# Patient Record
Sex: Male | Born: 1995
Health system: Southern US, Community
[De-identification: ages and names within clinical notes are randomized; demographics above are authoritative.]

## PROBLEM LIST (undated history)

## (undated) DIAGNOSIS — E782 Mixed hyperlipidemia: Secondary | ICD-10-CM

## (undated) DIAGNOSIS — D649 Anemia, unspecified: Secondary | ICD-10-CM

## (undated) DIAGNOSIS — K219 Gastro-esophageal reflux disease without esophagitis: Secondary | ICD-10-CM

## (undated) DIAGNOSIS — G43909 Migraine, unspecified, not intractable, without status migrainosus: Secondary | ICD-10-CM

## (undated) DIAGNOSIS — K5090 Crohn's disease, unspecified, without complications: Secondary | ICD-10-CM

## (undated) DIAGNOSIS — E66811 Obesity, class 1: Secondary | ICD-10-CM

## (undated) DIAGNOSIS — Z86010 Personal history of colonic polyps: Secondary | ICD-10-CM

## (undated) DIAGNOSIS — K602 Anal fissure, unspecified: Secondary | ICD-10-CM

## (undated) DIAGNOSIS — J302 Other seasonal allergic rhinitis: Secondary | ICD-10-CM

## (undated) DIAGNOSIS — R202 Paresthesia of skin: Secondary | ICD-10-CM

## (undated) DIAGNOSIS — I67850 Cerebral autosomal dominant arteriopathy with subcortical infarcts and leukoencephalopathy: Secondary | ICD-10-CM

## (undated) DIAGNOSIS — F419 Anxiety disorder, unspecified: Secondary | ICD-10-CM

## (undated) DIAGNOSIS — E669 Obesity, unspecified: Secondary | ICD-10-CM

## (undated) DIAGNOSIS — I471 Supraventricular tachycardia, unspecified: Secondary | ICD-10-CM

## (undated) HISTORY — DX: Other seasonal allergic rhinitis: J30.2

## (undated) HISTORY — DX: Anemia, unspecified: D64.9

## (undated) HISTORY — DX: Mixed hyperlipidemia: E78.2

## (undated) HISTORY — DX: Crohn's disease, unspecified, without complications: K50.90

## (undated) HISTORY — DX: Migraine, unspecified, not intractable, without status migrainosus: G43.909

## (undated) HISTORY — DX: Gastro-esophageal reflux disease without esophagitis: K21.9

## (undated) HISTORY — DX: Supraventricular tachycardia, unspecified: I47.10

## (undated) HISTORY — DX: Paresthesia of skin: R20.2

## (undated) HISTORY — DX: Obesity, unspecified: E66.9

## (undated) HISTORY — DX: Anal fissure, unspecified: K60.2

## (undated) HISTORY — PX: COLONOSCOPY: SHX174

## (undated) HISTORY — DX: Cerebral autosomal dominant arteriopathy with subcortical infarcts and leukoencephalopathy: I67.850

## (undated) HISTORY — DX: Obesity, class 1: E66.811

## (undated) HISTORY — PX: TRANSTHORACIC ECHOCARDIOGRAM: SHX275

## (undated) HISTORY — DX: Anxiety disorder, unspecified: F41.9

---
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
## 2015-01-06 DIAGNOSIS — J019 Acute sinusitis, unspecified: Secondary | ICD-10-CM | POA: Insufficient documentation

## 2016-01-03 DIAGNOSIS — Z79899 Other long term (current) drug therapy: Secondary | ICD-10-CM | POA: Diagnosis not present

## 2016-01-03 DIAGNOSIS — K602 Anal fissure, unspecified: Secondary | ICD-10-CM | POA: Insufficient documentation

## 2016-01-03 DIAGNOSIS — K219 Gastro-esophageal reflux disease without esophagitis: Secondary | ICD-10-CM | POA: Diagnosis not present

## 2016-01-03 DIAGNOSIS — K210 Gastro-esophageal reflux disease with esophagitis: Secondary | ICD-10-CM | POA: Diagnosis not present

## 2016-01-03 DIAGNOSIS — D509 Iron deficiency anemia, unspecified: Secondary | ICD-10-CM | POA: Insufficient documentation

## 2016-01-03 DIAGNOSIS — K5010 Crohn's disease of large intestine without complications: Secondary | ICD-10-CM | POA: Diagnosis not present

## 2016-08-23 ENCOUNTER — Emergency Department
Admission: EM | Admit: 2016-08-23 | Discharge: 2016-08-23 | Disposition: A | Discharge status: Home / Self Care | Payer: 59 | Source: Home / Self Care | Attending: Family Medicine | Admitting: Family Medicine

## 2016-08-23 ENCOUNTER — Emergency Department (INDEPENDENT_AMBULATORY_CARE_PROVIDER_SITE_OTHER): Payer: 59

## 2016-08-23 ENCOUNTER — Ambulatory Visit: Payer: Self-pay | Admitting: Physician Assistant

## 2016-08-23 DIAGNOSIS — B9789 Other viral agents as the cause of diseases classified elsewhere: Secondary | ICD-10-CM

## 2016-08-23 DIAGNOSIS — R509 Fever, unspecified: Secondary | ICD-10-CM | POA: Diagnosis not present

## 2016-08-23 DIAGNOSIS — R0981 Nasal congestion: Secondary | ICD-10-CM

## 2016-08-23 DIAGNOSIS — J069 Acute upper respiratory infection, unspecified: Secondary | ICD-10-CM

## 2016-08-23 IMAGING — DX DG SINUSES COMPLETE 3+V
3 series · 3 of 3 positions shown · non-contrast
Comparison: No prior.

CLINICAL DATA: Fever.  Pressure .

EXAM:
PARANASAL SINUSES - COMPLETE 3 + VIEW

[pns waters]
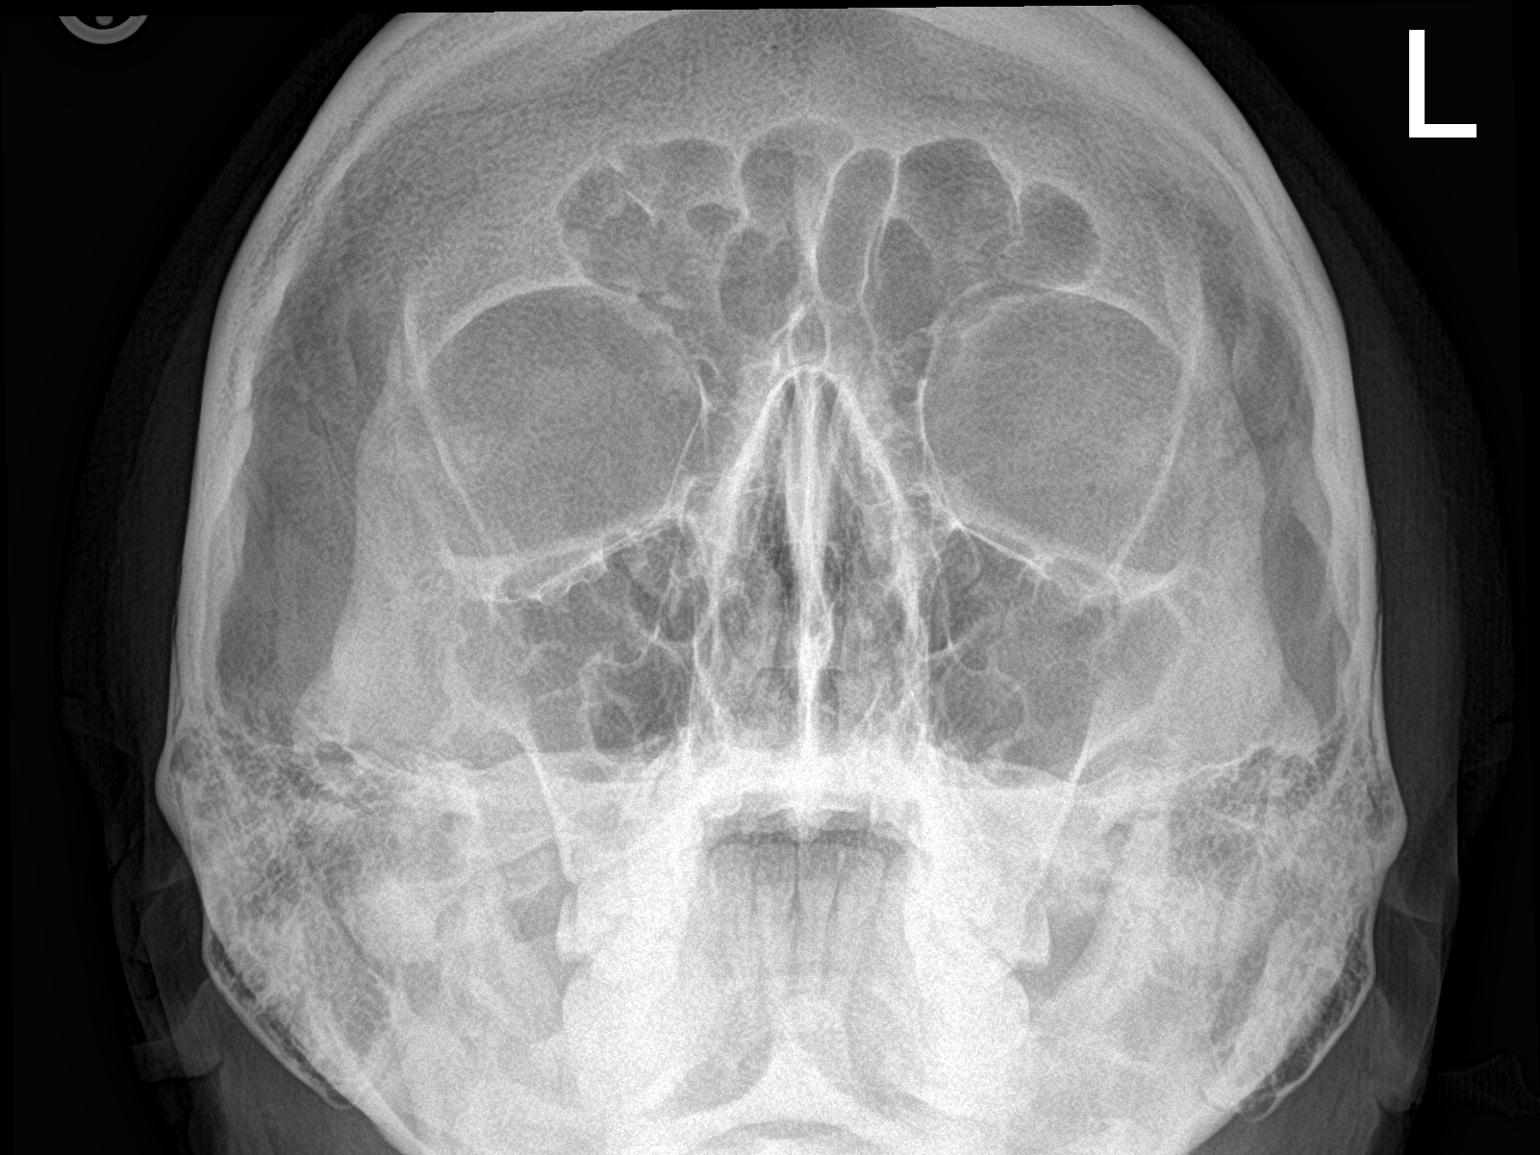

[[person_name]]
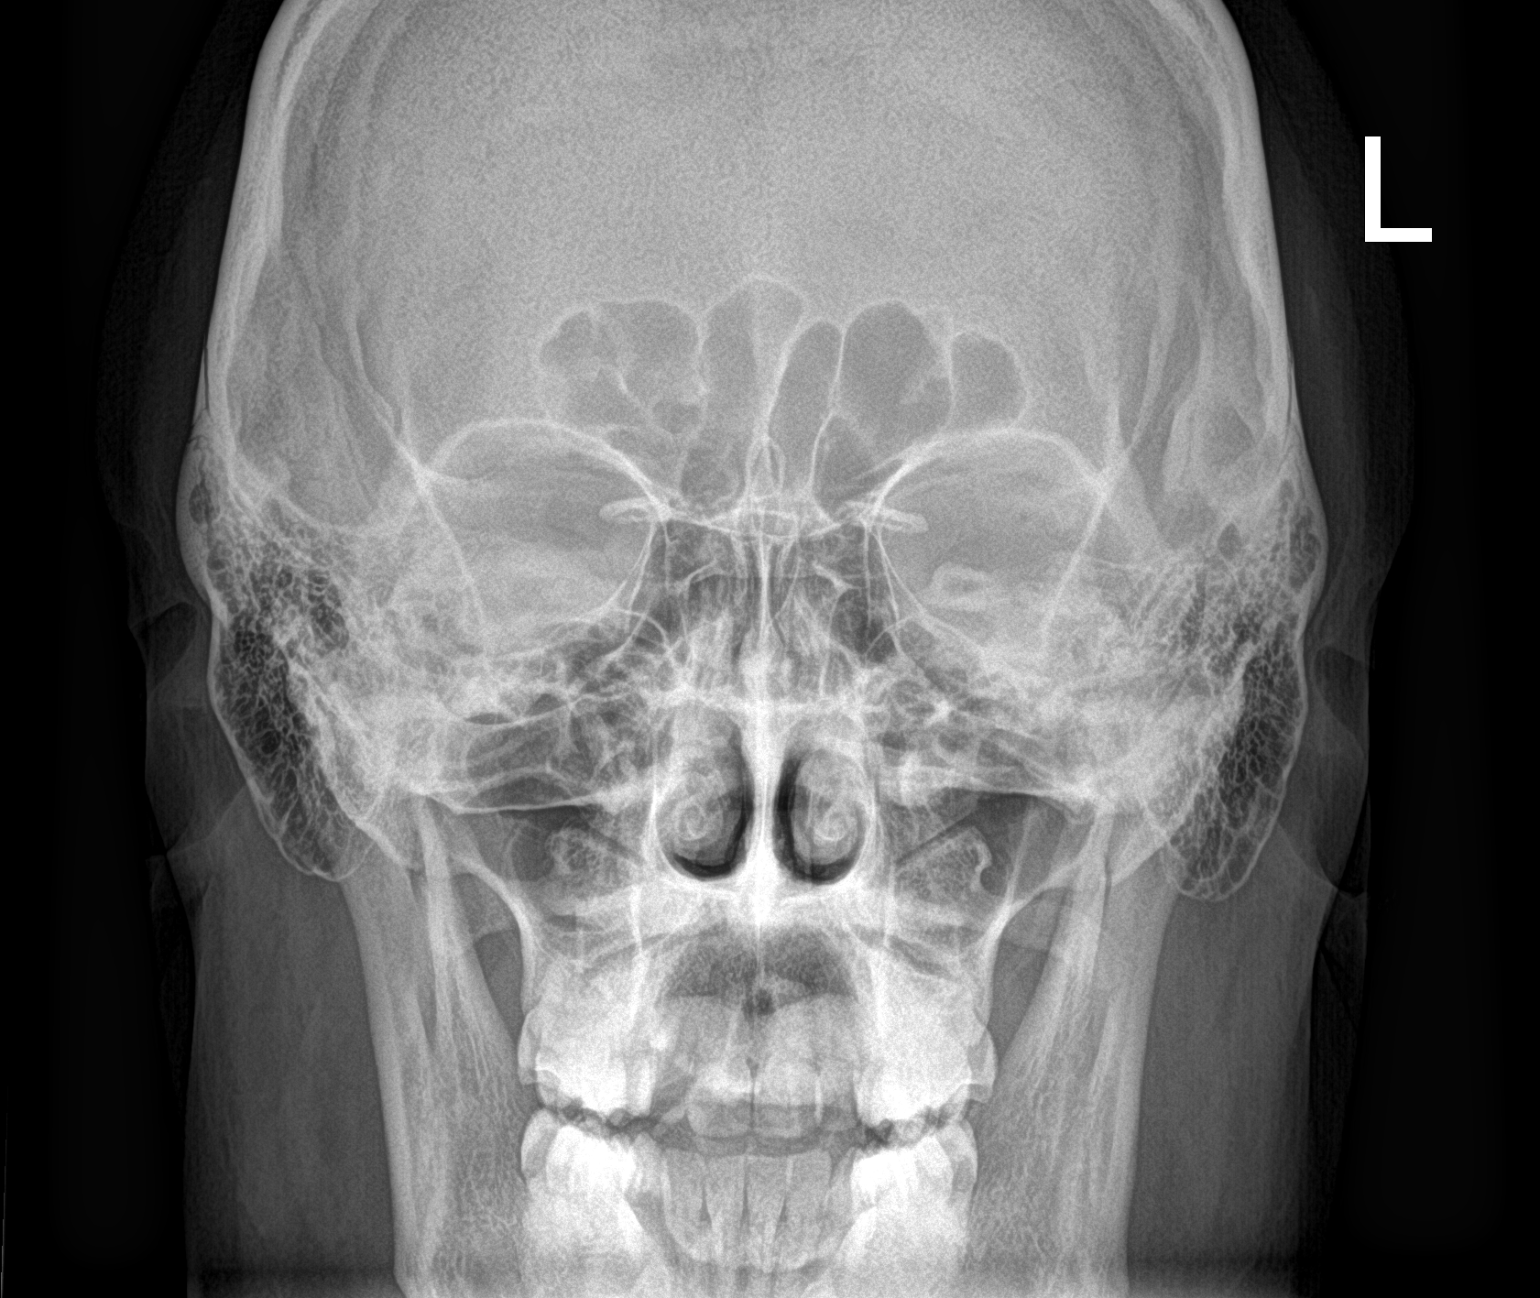

[pns lat]
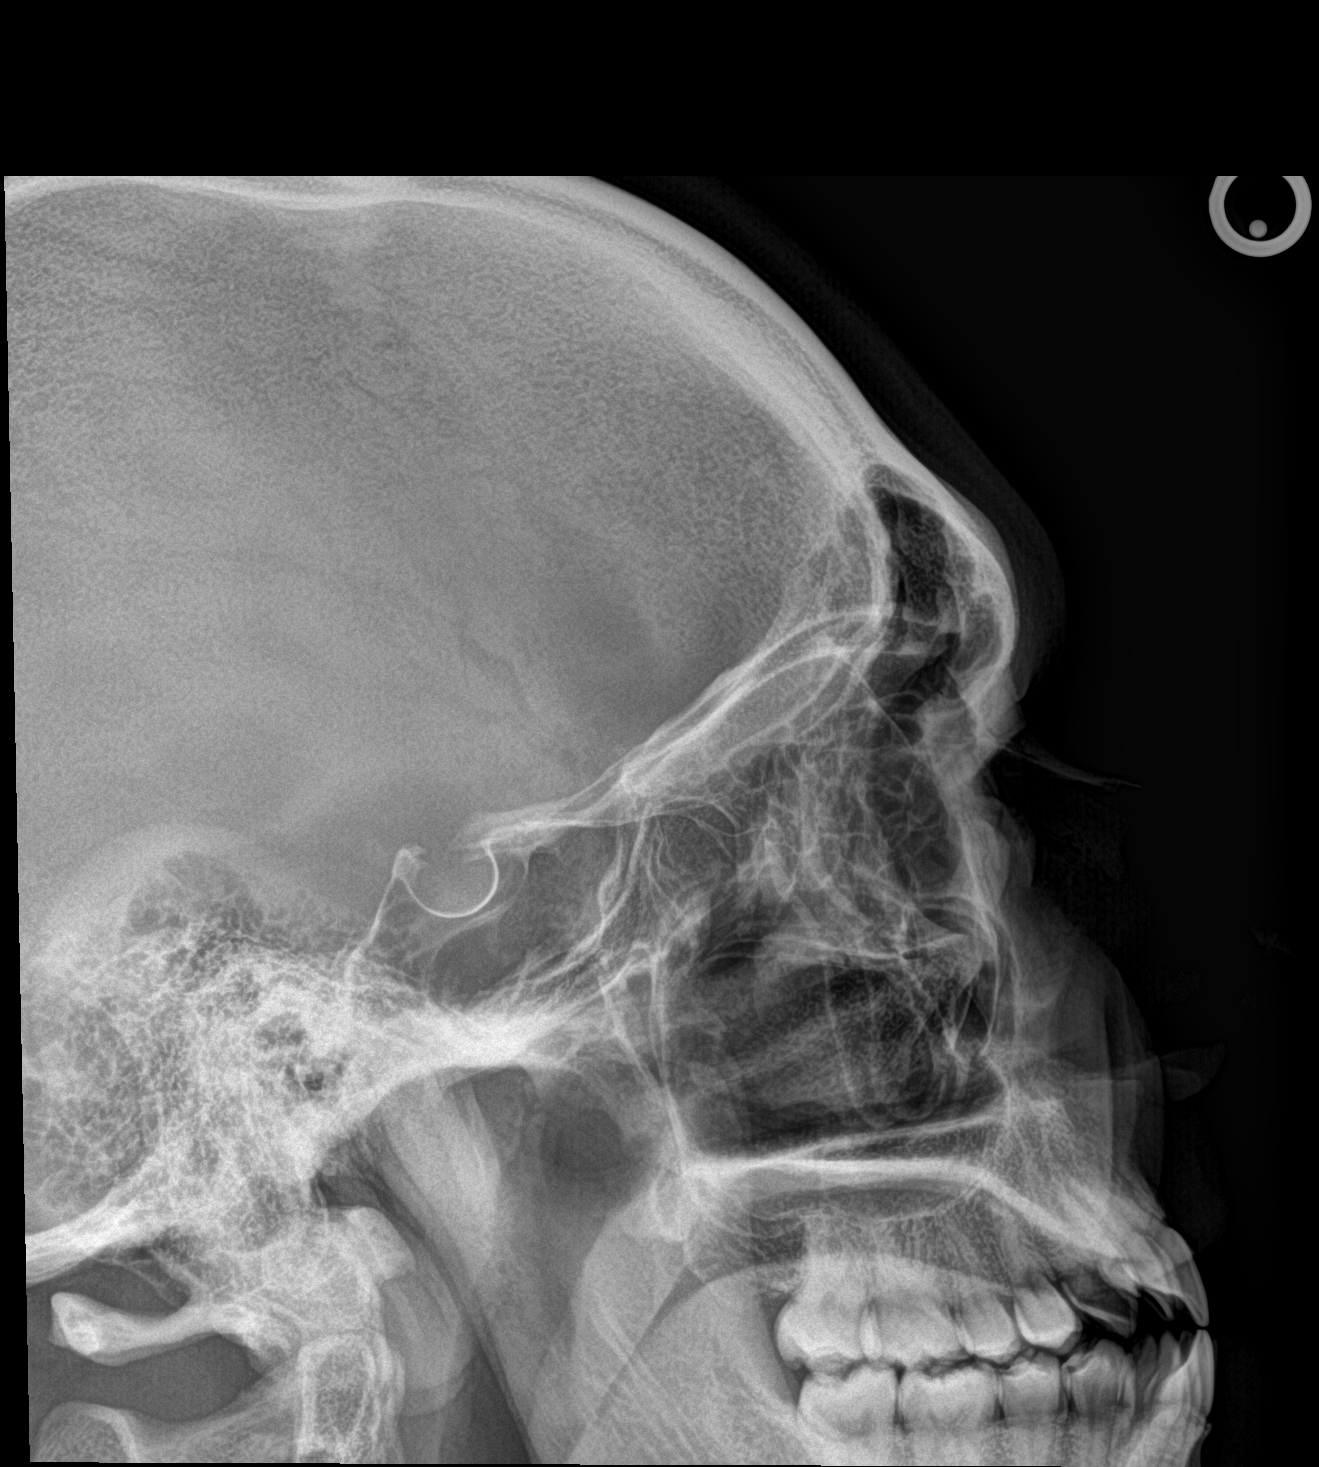

[3 of 3 positions shown; findings below may reference images not displayed]

FINDINGS: Paranasal sinuses are clear. No acute bony abnormality identified.
Orbits intact. Mastoids are clear.
IMPRESSION: No acute or focal abnormality.  No evidence of sinusitis.

## 2016-08-23 MED ORDER — AZITHROMYCIN 250 MG PO TABS: ORAL_TABLET | ORAL | 0 refills | Status: DC

## 2016-08-23 MED ORDER — BENZONATATE 200 MG PO CAPS: ORAL_CAPSULE | ORAL | 0 refills | Status: DC

## 2016-08-23 MED FILL — BENZONATATE 200 MG CAPSULE: 200 | 3 days supply | Qty: 15 | Fill #0

## 2016-08-23 MED FILL — AZITHROMYCIN 250 MG TABLET: 250 | 5 days supply | Qty: 6 | Fill #0

## 2016-08-23 NOTE — Discharge Instructions (Signed)
Take plain guaifenesin (1200mg  extended release tabs such as Mucinex) twice daily, with plenty of water, for cough and congestion.  Continue Pseudoephedrine (30mg , one or two every 4 to 6 hours) for sinus congestion.  Get adequate rest.   May use Afrin nasal spray (or generic oxymetazoline) twice daily for about 5 days and then discontinue.  Also recommend using saline nasal spray several times daily and saline nasal irrigation (AYR is a common brand).  Use Flonase nasal spray each morning after using Afrin nasal spray and saline nasal irrigation. Try warm salt water gargles for sore throat.  Stop all antihistamines for now, and other non-prescription cough/cold preparations.

## 2016-08-23 NOTE — ED Triage Notes (Signed)
Has been going on for about 2 weeks, and has progressively become worse.  Coughing up green mucous.  For the past 5-6 days, low grade fever, the highest was 101 yesterday.  Sinus pressure, sore throat, and fullness in the ears.

## 2016-08-23 NOTE — ED Provider Notes (Signed)
[facility] CARE    CSN: [id number] Arrival date & time: 08/23/16  1331     History   Chief Complaint Chief Complaint  Patient presents with  . Sore Throat  . Fever  . Nasal Congestion    HPI Michael Moon is a 21 y.o. male.   Patient has had increased sinus congestion for about two weeks. During the past 5 days he has developed typical cold-like symptoms developing over several days, including mild sore throat, worsening sinus congestion, headache, fatigue, and cough.  He has also had fever as high as 101 yesterday.  He has a history of recurring sinusitis.   The history is provided by the patient.    History reviewed. No pertinent past medical history.  There are no active problems to display for this patient.   Past Surgical History:  Procedure Laterality Date  . TONSILLECTOMY         Home Medications    Prior to Admission medications   Medication Sig Start Date End Date Taking? Authorizing Provider  mercaptopurine (PURINETHOL) 50 MG tablet Take 100 mg by mouth daily. Give on an empty stomach 1 hour before or 2 hours after meals. Caution: Chemotherapy.   Yes Historical Provider, MD  mesalamine (LIALDA) 1.2 g EC tablet Take 4.8 g by mouth daily with breakfast.   Yes Historical Provider, MD  omeprazole (PRILOSEC) 40 MG capsule Take 40 mg by mouth daily.   Yes Historical Provider, MD  azithromycin (ZITHROMAX Z-PAK) 250 MG tablet Take 2 tabs today; then begin one tab once daily for 4 more days. 08/23/16   Michael Nicolas, MD  benzonatate (TESSALON) 200 MG capsule Take one cap by mouth at bedtime as needed for cough.  May repeat in 4 to 6 hours 08/23/16   Michael Nicolas, MD    Family History History reviewed. No pertinent family history.  Social History Social History  Substance Use Topics  . Smoking status: Never Smoker  . Smokeless tobacco: Never Used  . Alcohol use No     Allergies   Codeine   Review of Systems Review of Systems + sore  throat + cough No pleuritic pain No wheezing + nasal congestion + post-nasal drainage + sinus pain/pressure No itchy/red eyes ? earache No hemoptysis No SOB + fever, + chills No nausea No vomiting No abdominal pain No diarrhea No urinary symptoms No skin rash + fatigue No myalgias + headache Used OTC meds without relief   Physical Exam Triage Vital Signs ED Triage Vitals  Enc Vitals Group     BP 08/23/16 1356 153/91     Pulse Rate 08/23/16 1356 97     Resp --      Temp 08/23/16 1356 98.4 F (36.9 C)     Temp Source 08/23/16 1356 Oral     SpO2 08/23/16 1356 97 %     Weight 08/23/16 1356 278 lb (126.1 kg)     Height 08/23/16 1356 6' (1.829 m)     Head Circumference --      Peak Flow --      Pain Score 08/23/16 1402 3     Pain Loc --      Pain Edu? --      Excl. in [place]? --    No data found.   Updated Vital Signs BP 153/91 (BP Location: Left Arm)   Pulse 97   Temp 98.4 F (36.9 C) (Oral)   Ht 6' (1.829 m)   Wt 278  lb (126.1 kg)   SpO2 97%   BMI 37.70 kg/m   Visual Acuity Right Eye Distance:   Left Eye Distance:   Bilateral Distance:    Right Eye Near:   Left Eye Near:    Bilateral Near:     Physical Exam Nursing notes and Vital Signs reviewed. Appearance:  Patient appears stated age, and in no acute distress Eyes:  Pupils are equal, round, and reactive to light and accomodation.  Extraocular movement is intact.  Conjunctivae are not inflamed  Ears:  Canals normal.  Tympanic membranes normal.  Nose:  Congested turbinates.  Maxillary sinus tenderness is present.  Pharynx:  Normal Neck:  Supple.  Tender enlarged posterior/lateral nodes are palpated bilaterally  Lungs:  Clear to auscultation.  Breath sounds are equal.  Moving air well. Heart:  Regular rate and rhythm without murmurs, rubs, or gallops.  Abdomen:  Nontender without masses or hepatosplenomegaly.  Bowel sounds are present.  No CVA or flank tenderness.  Extremities:  No edema.  Skin:   No rash present.    UC Treatments / Results  Labs (all labs ordered are listed, but only abnormal results are displayed) Labs Reviewed - No data to display  EKG  EKG Interpretation None       Radiology Dg Sinuses Complete  Result Date: 08/23/2016 CLINICAL DATA:  Fever.  Pressure . EXAM: PARANASAL SINUSES - COMPLETE 3 + VIEW COMPARISON:  No prior. FINDINGS: Paranasal sinuses are clear. No acute bony abnormality identified. Orbits intact. Mastoids are clear. IMPRESSION: No acute or focal abnormality.  No evidence of sinusitis. Electronically Signed   By: Marcello Moores  Register   On: 08/23/2016 15:07    Procedures Procedures (including critical care time)  Medications Ordered in UC Medications - No data to display   Initial Impression / Assessment and Plan / UC Course  I have reviewed the triage vital signs and the nursing notes.  Pertinent labs & imaging results that were available during my care of the patient were reviewed by me and considered in my medical decision making (see chart for details).  Clinical Course   With his history of recurring sinusitis, will begin empiric Z-pak.  Prescription written for Benzonatate [facility]) to take at bedtime for night-time cough.  Take plain guaifenesin (1200mg  extended release tabs such as Mucinex) twice daily, with plenty of water, for cough and congestion.  Continue Pseudoephedrine (30mg , one or two every 4 to 6 hours) for sinus congestion.  Get adequate rest.   May use Afrin nasal spray (or generic oxymetazoline) twice daily for about 5 days and then discontinue.  Also recommend using saline nasal spray several times daily and saline nasal irrigation (AYR is a common brand).  Use Flonase nasal spray each morning after using Afrin nasal spray and saline nasal irrigation. Try warm salt water gargles for sore throat.  Stop all antihistamines for now, and other non-prescription cough/cold preparations. Followup with Family Doctor if not  improved in one week.      Final Clinical Impressions(s) / UC Diagnoses   Final diagnoses:  Viral URI with cough    New Prescriptions New Prescriptions   AZITHROMYCIN (ZITHROMAX Z-PAK) 250 MG TABLET    Take 2 tabs today; then begin one tab once daily for 4 more days.   BENZONATATE (TESSALON) 200 MG CAPSULE    Take one cap by mouth at bedtime as needed for cough.  May repeat in 4 to 6 hours     Michael Nicolas, MD  09/07/16 2127  

## 2016-08-26 ENCOUNTER — Telehealth: Payer: Self-pay | Admitting: Emergency Medicine

## 2016-08-26 NOTE — Telephone Encounter (Signed)
Spoke w/pt states that he is doing better, improving everyday.  [place]

## 2017-01-29 ENCOUNTER — Ambulatory Visit: Payer: Self-pay | Admitting: Family

## 2017-01-29 VITALS — BP 120/92 | HR 92 | Temp 98.8°F

## 2017-01-29 DIAGNOSIS — J0100 Acute maxillary sinusitis, unspecified: Secondary | ICD-10-CM

## 2017-01-29 MED ORDER — CEFDINIR 300 MG PO CAPS: 300.0000 mg | ORAL_CAPSULE | Freq: Two times a day (BID) | ORAL | 0 refills | Status: DC

## 2017-01-29 NOTE — Progress Notes (Signed)
S/: Michael Moon complains of a one-week history of increased nasal drainage with green color, sore throat, fatigue, low-grade fever. He denies chest pain shortness of breath GI symptoms. Objective O/: Vital signs stable alert pleasant mildly ill-appearing mouth breathing ENT: TMs dull, retracted, nasal mucosa erythematous, turbinates boggy with increased purulent rhinorrhea, maxillary tenderness to palpation, pharynx slightly erythematous neck supple without tenderness or adenopathy heart regular sinus rhythm without murmurs or gallops lungs are clear  A/: Maxillary sinusitis  P/: Omnicef,  Nasal saline products bid and prn Supportive measures discussed. Follow up prn not improving

## 2017-02-14 DIAGNOSIS — K50111 Crohn's disease of large intestine with rectal bleeding: Secondary | ICD-10-CM | POA: Diagnosis not present

## 2017-02-14 DIAGNOSIS — Z79899 Other long term (current) drug therapy: Secondary | ICD-10-CM | POA: Diagnosis not present

## 2017-02-14 DIAGNOSIS — K210 Gastro-esophageal reflux disease with esophagitis: Secondary | ICD-10-CM | POA: Diagnosis not present

## 2017-02-14 DIAGNOSIS — Z862 Personal history of diseases of the blood and blood-forming organs and certain disorders involving the immune mechanism: Secondary | ICD-10-CM | POA: Diagnosis not present

## 2017-02-14 DIAGNOSIS — K5010 Crohn's disease of large intestine without complications: Secondary | ICD-10-CM | POA: Diagnosis not present

## 2017-07-26 ENCOUNTER — Ambulatory Visit: Payer: 59 | Admitting: Emergency Medicine

## 2017-07-26 VITALS — BP 140/100 | HR 89 | Temp 98.6°F

## 2017-07-26 DIAGNOSIS — J0101 Acute recurrent maxillary sinusitis: Secondary | ICD-10-CM

## 2017-07-26 MED ORDER — PREDNISONE 10 MG PO TABS: ORAL_TABLET | ORAL | 0 refills | Status: DC

## 2017-07-26 MED ORDER — FLUCONAZOLE 150 MG PO TABS: 150.0000 mg | ORAL_TABLET | Freq: Once | ORAL | 0 refills | Status: AC

## 2017-07-26 MED ORDER — AMOXICILLIN-POT CLAVULANATE 875-125 MG PO TABS: 1.0000 | ORAL_TABLET | Freq: Two times a day (BID) | ORAL | 0 refills | Status: DC

## 2017-07-26 NOTE — Progress Notes (Signed)
S: Is here with complaint of 1.5 weeks of sinus pressure and facial pain.  Patient is also had fever and a green productive cough.  Patient has had numerous problems and surgeries for his sinuses in the past.  He has been taking Sudafed and Robitussin without any relief.  He also has had decreased hearing. O: TMs are dull bilaterally with poor light reflex.  Moderate tenderness on percussion maxillary sinuses bilaterally.  Posterior pharynx moderate injection seen.  Neck is supple without adenopathy.  Lungs are clear bilaterally.  Heart regular rate and rhythm. A: Acute maxillary sinusitis. P: Augmentin 875 twice daily for 10 days, prednisone 60 mg 6-day taper, and Diflucan 150 mg 1 tablet if needed for thrush.

## 2017-10-03 ENCOUNTER — Emergency Department
Admission: EM | Admit: 2017-10-03 | Discharge: 2017-10-03 | Disposition: A | Discharge status: Home / Self Care | Payer: 59 | Source: Home / Self Care | Attending: Family Medicine | Admitting: Family Medicine

## 2017-10-03 ENCOUNTER — Encounter: Payer: Self-pay | Admitting: Emergency Medicine

## 2017-10-03 ENCOUNTER — Other Ambulatory Visit: Payer: Self-pay

## 2017-10-03 DIAGNOSIS — J0101 Acute recurrent maxillary sinusitis: Secondary | ICD-10-CM | POA: Diagnosis not present

## 2017-10-03 MED ORDER — METHYLPREDNISOLONE SODIUM SUCC 125 MG IJ SOLR
80.0000 mg | Freq: Once | INTRAMUSCULAR | Status: AC
  Administered 2017-10-03: 80 mg via INTRAMUSCULAR

## 2017-10-03 MED ORDER — PREDNISONE 20 MG PO TABS: ORAL_TABLET | ORAL | 0 refills | Status: DC

## 2017-10-03 MED ORDER — CEFDINIR 300 MG PO CAPS: 300.0000 mg | ORAL_CAPSULE | Freq: Two times a day (BID) | ORAL | 0 refills | Status: DC

## 2017-10-03 MED FILL — predniSONE 20 MG TABS: 20 | 7 days supply | Qty: 11 | Fill #0

## 2017-10-03 MED FILL — CEFDINIR 300 MG CAPS: 300 | 10 days supply | Qty: 20 | Fill #0

## 2017-10-03 NOTE — Discharge Instructions (Signed)
Begin prednisone Friday 10/04/17. Continue Pseudoephedrine (30mg , one or two every 4 to 6 hours) for sinus congestion.  Get adequate rest.   May use Afrin nasal spray (or generic oxymetazoline) each morning for about 5 days and then discontinue.  Also recommend using saline nasal spray several times daily and saline nasal irrigation (AYR is a common brand).  Use Flonase nasal spray each morning after using Afrin nasal spray and saline nasal irrigation. Stop all antihistamines for now, and other non-prescription cough/cold preparations.

## 2017-10-03 NOTE — ED Triage Notes (Signed)
Sinus pain, pressure, congestion, green mucus, sneezing, slight cough, fever, itchy throat, can't sleep x 10days

## 2017-10-03 NOTE — ED Provider Notes (Signed)
[facility] Moon CARE    CSN: [id number] Arrival date & time: 10/03/17  1249     History   Chief Complaint Chief Complaint  Patient presents with  . Sinus Problem    HPI Michael Moon is a 22 y.o. male.   Patient complains of persistent sinus congestion and facial pain for about 10 days.  He has an intermittent sore throat and occasional cough.  He had fever to 100.5 three days ago.  He continues to have sweats.  His sinus congestion is improved somewhat with pseudoephedrine.   The history is provided by the patient.    History reviewed. No pertinent past medical history.  There are no active problems to display for this patient.   Past Surgical History:  Procedure Laterality Date  . TONSILLECTOMY         Home Medications    Prior to Admission medications   Medication Sig Start Date End Date Taking? Authorizing Provider  cefdinir (OMNICEF) 300 MG capsule Take 1 capsule (300 mg total) by mouth 2 (two) times daily. 10/03/17   Kandra Nicolas, MD  mercaptopurine (PURINETHOL) 50 MG tablet Take 100 mg by mouth daily. Give on an empty stomach 1 hour before or 2 hours after meals. Caution: Chemotherapy.    [provider]  mesalamine (LIALDA) 1.2 g EC tablet Take 4.8 g by mouth daily with breakfast.    [provider]  omeprazole (PRILOSEC) 40 MG capsule Take 40 mg by mouth daily.    [provider]  predniSONE (DELTASONE) 20 MG tablet Take one tab by mouth twice daily for 4 days, then one daily for 3 days. Take with food. 10/03/17   Kandra Nicolas, MD    Family History No family history on file.  Social History Social History   Tobacco Use  . Smoking status: Never Smoker  . Smokeless tobacco: Never Used  Substance Use Topics  . Alcohol use: No  . Drug use: No     Allergies   Codeine   Review of Systems Review of Systems + sore throat + occasional cough No pleuritic pain No wheezing + nasal congestion + post-nasal  drainage + sinus pain/pressure No itchy/red eyes No earache No hemoptysis No SOB + fever, + chills No nausea No vomiting No abdominal pain No diarrhea No urinary symptoms No skin rash + fatigue No myalgias + headache Used OTC meds without relief   Physical Exam Triage Vital Signs ED Triage Vitals  Enc Vitals Group     BP 10/03/17 1331 130/85     Pulse Rate 10/03/17 1331 (!) 103     Resp --      Temp 10/03/17 1331 98.4 F (36.9 C)     Temp Source 10/03/17 1331 Oral     SpO2 10/03/17 1331 98 %     Weight 10/03/17 1332 261 lb (118.4 kg)     Height 10/03/17 1332 6' (1.829 m)     Head Circumference --      Peak Flow --      Pain Score 10/03/17 1332 4     Pain Loc --      Pain Edu? --      Excl. in [place]? --    No data found.  Updated Vital Signs BP 130/85 (BP Location: Right Arm)   Pulse (!) 103   Temp 98.4 F (36.9 C) (Oral)   Ht 6' (1.829 m)   Wt 261 lb (118.4 kg)   SpO2 98%  BMI 35.40 kg/m   Visual Acuity Right Eye Distance:   Left Eye Distance:   Bilateral Distance:    Right Eye Near:   Left Eye Near:    Bilateral Near:     Physical Exam Nursing notes and Vital Signs reviewed. Appearance:  Patient appears stated age, and in no acute distress Eyes:  Pupils are equal, round, and reactive to light and accomodation.  Extraocular movement is intact.  Conjunctivae are not inflamed  Ears:  Canals normal.  Tympanic membranes normal.  Nose:  Congested turbinates.   Maxillary sinus tenderness is present.  Pharynx:  Normal Neck:  Supple. Lungs:  Clear to auscultation.  Breath sounds are equal.  Moving air well. Heart:  Regular rate and rhythm without murmurs, rubs, or gallops.  Abdomen:  Nontender without masses or hepatosplenomegaly.  Bowel sounds are present.  No CVA or flank tenderness.  Extremities:  No edema.  Skin:  No rash present.    UC Treatments / Results  Labs (all labs ordered are listed, but only abnormal results are displayed) Labs Reviewed  - No data to display  EKG  EKG Interpretation None       Radiology No results found.  Procedures Procedures (including critical care time)  Medications Ordered in UC Medications  methylPREDNISolone sodium succinate (SOLU-MEDROL) 125 mg/2 mL injection 80 mg (80 mg Intramuscular Given 10/03/17 1357)     Initial Impression / Assessment and Plan / UC Course  I have reviewed the triage vital signs and the nursing notes.  Pertinent labs & imaging results that were available during my care of the patient were reviewed by me and considered in my medical decision making (see chart for details).    Administered Solumedrol 80mg  IM. Begin Omnicef 300mg  BID. Begin prednisone burst/taper Friday 10/04/17. Continue Pseudoephedrine (30mg , one or two every 4 to 6 hours) for sinus congestion.  Get adequate rest.   May use Afrin nasal spray (or generic oxymetazoline) each morning for about 5 days and then discontinue.  Also recommend using saline nasal spray several times daily and saline nasal irrigation (AYR is a common brand).  Use Flonase nasal spray each morning after using Afrin nasal spray and saline nasal irrigation. Stop all antihistamines for now, and other non-prescription cough/cold preparations. Followup ENT if not improved 10 days.    Final Clinical Impressions(s) / UC Diagnoses   Final diagnoses:  Acute recurrent maxillary sinusitis    ED Discharge Orders        Ordered    cefdinir (OMNICEF) 300 MG capsule  2 times daily     10/03/17 1354    predniSONE (DELTASONE) 20 MG tablet     10/03/17 1354           Kandra Nicolas, MD 10/06/17 2018

## 2017-10-11 ENCOUNTER — Ambulatory Visit: Payer: 59 | Admitting: Physician Assistant

## 2017-11-01 ENCOUNTER — Encounter: Payer: Self-pay | Admitting: *Deleted

## 2017-11-01 DIAGNOSIS — K5010 Crohn's disease of large intestine without complications: Secondary | ICD-10-CM | POA: Insufficient documentation

## 2017-11-04 ENCOUNTER — Ambulatory Visit: Payer: 59 | Admitting: Family Medicine

## 2017-11-11 ENCOUNTER — Telehealth: Payer: Self-pay

## 2017-11-11 ENCOUNTER — Encounter: Payer: Self-pay | Admitting: Family Medicine

## 2017-11-11 ENCOUNTER — Ambulatory Visit: Payer: 59 | Admitting: Family Medicine

## 2017-11-11 VITALS — BP 138/80 | HR 76 | Temp 99.1°F | Resp 16 | Ht 73.0 in | Wt 263.1 lb

## 2017-11-11 DIAGNOSIS — K5090 Crohn's disease, unspecified, without complications: Secondary | ICD-10-CM | POA: Diagnosis not present

## 2017-11-11 DIAGNOSIS — Z0000 Encounter for general adult medical examination without abnormal findings: Secondary | ICD-10-CM

## 2017-11-11 DIAGNOSIS — Z114 Encounter for screening for human immunodeficiency virus [HIV]: Secondary | ICD-10-CM | POA: Diagnosis not present

## 2017-11-11 DIAGNOSIS — Z23 Encounter for immunization: Secondary | ICD-10-CM

## 2017-11-11 LAB — LIPID PANEL
Cholesterol: 204 mg/dL — ABNORMAL HIGH (ref 0–200)
HDL: 38.7 mg/dL — ABNORMAL LOW (ref >39.00)
NONHDL: 164.94
Total CHOL/HDL Ratio: 5
Triglycerides: 242 mg/dL — ABNORMAL HIGH (ref 0.0–149.0)
VLDL: 48.4 mg/dL — AB (ref 0.0–40.0)

## 2017-11-11 LAB — TSH: TSH: 1.56 u[IU]/mL (ref 0.35–4.50)

## 2017-11-11 LAB — COMPREHENSIVE METABOLIC PANEL
ALK PHOS: 56 U/L (ref 39–117)
ALT: 31 U/L (ref 0–53)
AST: 16 U/L (ref 0–37)
Albumin: 4.5 g/dL (ref 3.5–5.2)
BUN: 11 mg/dL (ref 6–23)
CO2: 26 meq/L (ref 19–32)
Calcium: 9.9 mg/dL (ref 8.4–10.5)
Chloride: 102 mEq/L (ref 96–112)
Creatinine, Ser: 0.79 mg/dL (ref 0.40–1.50)
GFR: 128.1 mL/min (ref >60.00)
GLUCOSE: 93 mg/dL (ref 70–99)
Potassium: 4.3 mEq/L (ref 3.5–5.1)
SODIUM: 138 meq/L (ref 135–145)
Total Bilirubin: 0.4 mg/dL (ref 0.2–1.2)
Total Protein: 7.5 g/dL (ref 6.0–8.3)

## 2017-11-11 LAB — CBC WITH DIFFERENTIAL/PLATELET
BASOS ABS: 0.1 10*3/uL (ref 0.0–0.1)
Basophils Relative: 0.8 % (ref 0.0–3.0)
EOS ABS: 0 10*3/uL (ref 0.0–0.7)
Eosinophils Relative: 0.5 % (ref 0.0–5.0)
HCT: 45.6 % (ref 39.0–52.0)
Hemoglobin: 15.2 g/dL (ref 13.0–17.0)
LYMPHS ABS: 1.3 10*3/uL (ref 0.7–4.0)
Lymphocytes Relative: 21 % (ref 12.0–46.0)
MCHC: 33.3 g/dL (ref 30.0–36.0)
MCV: 85.4 fl (ref 78.0–100.0)
MONO ABS: 0.4 10*3/uL (ref 0.1–1.0)
MONOS PCT: 6.2 % (ref 3.0–12.0)
NEUTROS PCT: 71.5 % (ref 43.0–77.0)
Neutro Abs: 4.5 10*3/uL (ref 1.4–7.7)
PLATELETS: 494 10*3/uL — AB (ref 150.0–400.0)
RBC: 5.34 Mil/uL (ref 4.22–5.81)
RDW: 14.9 % (ref 11.5–15.5)
WBC: 6.3 10*3/uL (ref 4.0–10.5)

## 2017-11-11 LAB — LDL CHOLESTEROL, DIRECT: LDL DIRECT: 140 mg/dL

## 2017-11-11 NOTE — Progress Notes (Signed)
Office Note 11/11/2017  CC:  Chief Complaint  Patient presents with  . Establish Care  . Annual Exam    Pt is fasting.    HPI:  Michael Moon is a 22 y.o. male who is here to establish care and get CPE. Patient's most recent primary MD: Pediatrician, Dr. Ardell Isaacs. Old records were not reviewed prior to or during today's visit.  Exercise: some walking. Diet: working on decreasing portions size and simple sugars.  Past Medical History:  Diagnosis Date  . Crohn disease ([place]) dx 2012  . GERD (gastroesophageal reflux disease)   . Obesity, Class I, BMI 30-34.9   . Seasonal allergic rhinitis     Past Surgical History:  Procedure Laterality Date  . COLONOSCOPY  2012   crohns  . ESOPHAGOGASTRODUODENOSCOPY  2012   +crohns  . TONSILLECTOMY AND ADENOIDECTOMY  08/13/2005    Family History  Problem Relation Age of Onset  . Arthritis Mother   . Hypertension Mother   . Brain cancer Father   . Hyperlipidemia Father   . Hypertension Father   . Arthritis Maternal Grandmother   . Diabetes Maternal Grandmother   . Hypertension Maternal Grandmother   . Stroke Maternal Grandmother   . Stroke Maternal Grandfather   . Hearing loss Maternal Grandfather   . Alcohol abuse Maternal Grandfather   . Alcohol abuse Paternal Grandmother   . Hypertension Paternal Grandmother   . Alcohol abuse Paternal Grandfather   . Early death Paternal Grandfather        unknown cause    Social History   Socioeconomic History  . Marital status: Single    Spouse name: Not on file  . Number of children: Not on file  . Years of education: Not on file  . Highest education level: Not on file  Occupational History  . Not on file  Social Needs  . Financial resource strain: Not on file  . Food insecurity:    Worry: Not on file    Inability: Not on file  . Transportation needs:    Medical: Not on file    Non-medical: Not on file  Tobacco Use  . Smoking status: Never Smoker  . Smokeless  tobacco: Never Used  Substance and Sexual Activity  . Alcohol use: No  . Drug use: No  . Sexual activity: Not on file  Lifestyle  . Physical activity:    Days per week: Not on file    Minutes per session: Not on file  . Stress: Not on file  Relationships  . Social connections:    Talks on phone: Not on file    Gets together: Not on file    Attends religious service: Not on file    Active member of club or organization: Not on file    Attends meetings of clubs or organizations: Not on file    Relationship status: Not on file  . Intimate partner violence:    Fear of current or ex partner: Not on file    Emotionally abused: Not on file    Physically abused: Not on file    Forced sexual activity: Not on file  Other Topics Concern  . Not on file  Social History Narrative   Single, no children.   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   No T/A/Ds.    Outpatient Encounter Medications as of 11/11/2017  Medication Sig  . cetirizine (ZYRTEC) 10 MG tablet Take 10 mg by mouth daily.  Marland Kitchen  mercaptopurine (PURINETHOL) 50 MG tablet Take 100 mg by mouth daily. Give on an empty stomach 1 hour before or 2 hours after meals. Caution: Chemotherapy.  . mesalamine (LIALDA) 1.2 g EC tablet Take 4.8 g by mouth daily with breakfast.  . omeprazole (PRILOSEC) 40 MG capsule Take 40 mg by mouth daily.  . ranitidine (ZANTAC) 150 MG capsule Take 150 mg by mouth every evening.  . [DISCONTINUED] cefdinir (OMNICEF) 300 MG capsule Take 1 capsule (300 mg total) by mouth 2 (two) times daily. (Patient not taking: Reported on 11/11/2017)  . [DISCONTINUED] predniSONE (DELTASONE) 20 MG tablet Take one tab by mouth twice daily for 4 days, then one daily for 3 days. Take with food. (Patient not taking: Reported on 11/11/2017)   No facility-administered encounter medications on file as of 11/11/2017.     Allergies  Allergen Reactions  . Codeine Rash    ROS Review of Systems   Constitutional: Negative for appetite change, chills, fatigue and fever.  HENT: Negative for congestion, dental problem, ear pain and sore throat.   Eyes: Negative for discharge, redness and visual disturbance.  Respiratory: Negative for cough, chest tightness, shortness of breath and wheezing.   Cardiovascular: Negative for chest pain, palpitations and leg swelling.  Gastrointestinal: Negative for abdominal pain, blood in stool, diarrhea, nausea and vomiting.  Genitourinary: Negative for difficulty urinating, dysuria, flank pain, frequency, hematuria and urgency.  Musculoskeletal: Negative for arthralgias, back pain, joint swelling, myalgias and neck stiffness.  Skin: Negative for pallor and rash.  Neurological: Negative for dizziness, speech difficulty, weakness and headaches.  Hematological: Negative for adenopathy. Does not bruise/bleed easily.  Psychiatric/Behavioral: Negative for confusion and sleep disturbance. The patient is not nervous/anxious.     PE; Blood pressure 138/80, pulse 76, temperature 99.1 F (37.3 C), temperature source Oral, resp. rate 16, height 6\' 1"  (1.854 m), weight 263 lb 2 oz (119.4 kg), SpO2 100 %. Body mass index is 34.72 kg/m.   Gen: Alert, well appearing.  Patient is oriented to person, place, time, and situation. AFFECT: pleasant, lucid thought and speech. ENT: Ears: EACs clear, normal epithelium.  TMs with good light reflex and landmarks bilaterally.  Eyes: no injection, icteris, swelling, or exudate.  EOMI, PERRLA. Nose: no drainage or turbinate edema/swelling.  No injection or focal lesion.  Mouth: lips without lesion/swelling.  Oral mucosa pink and moist.  Dentition intact and without obvious caries or gingival swelling.  Oropharynx without erythema, exudate, or swelling.  Neck: supple/nontender.  No LAD, mass, or TM.  Carotid pulses 2+ bilaterally, without bruits. CV: RRR, no m/r/g.   LUNGS: CTA bilat, nonlabored resps, good aeration in all lung  fields. ABD: soft, NT, ND, BS normal.  No hepatospenomegaly or mass.  No bruits. EXT: no clubbing, cyanosis, or edema.  Musculoskeletal: no joint swelling, erythema, warmth, or tenderness.  ROM of all joints intact. Skin - no sores or suspicious lesions or rashes or color changes   Pertinent labs:  No results found for: TSH No results found for: WBC, HGB, HCT, MCV, PLT No results found for: CREATININE, BUN, NA, K, CL, CO2 No results found for: ALT, AST, GGT, ALKPHOS, BILITOT No results found for: CHOL No results found for: HDL No results found for: LDLCALC No results found for: TRIG No results found for: CHOLHDL No results found for: PSA   ASSESSMENT AND PLAN:   New pt; obtain last 1 yr of GI records.  1) Health maintenance exam: Reviewed age and gender appropriate health maintenance issues (  prudent diet, regular exercise, health risks of tobacco and excessive alcohol, use of seatbelts, fire alarms in home, use of sunscreen).  Also reviewed age and gender appropriate health screening as well as vaccine recommendations. Vaccines: Tdap. Labs: fasting HP + HIV screen.  2) Crohn's disease in remission since 2012: due to insurance network reasons/cost, pt requests referral to local GI MD: order for referral to Dr. Carlean Purl with  GI placed today.  An After Visit Summary was printed and given to the patient.  Return in about 1 year (around 11/12/2018) for annual CPE (fasting).  Signed:  Crissie Sickles, MD           11/11/2017

## 2017-11-11 NOTE — Patient Instructions (Signed)

## 2017-11-11 NOTE — Telephone Encounter (Signed)
Dr. Carlean Purl this patient was referred by his pcp, and is requesting to transfer his care from [facility] to you. He states it is due to insurance. All his records are in care everywhere. Please review and let me know if you will you accept.

## 2017-11-11 NOTE — Addendum Note (Signed)
Addended by: Gordy Councilman on: 11/11/2017 10:51 AM   Modules accepted: Orders

## 2017-11-11 NOTE — Telephone Encounter (Signed)
OK I will accept

## 2017-11-12 LAB — HIV ANTIBODY (ROUTINE TESTING W REFLEX): HIV 1&2 Ab, 4th Generation: NONREACTIVE

## 2017-11-14 ENCOUNTER — Encounter: Payer: Self-pay | Admitting: Internal Medicine

## 2017-11-14 NOTE — Telephone Encounter (Signed)
Patient called back about referral and was scheduled for 5.29.19. Records in care everywhere per [place].

## 2018-01-01 ENCOUNTER — Ambulatory Visit: Payer: 59 | Admitting: Internal Medicine

## 2018-01-08 ENCOUNTER — Ambulatory Visit: Payer: 59 | Admitting: Internal Medicine

## 2018-01-08 ENCOUNTER — Encounter: Payer: Self-pay | Admitting: Internal Medicine

## 2018-01-08 VITALS — BP 132/88 | HR 76 | Ht 72.0 in | Wt 262.0 lb

## 2018-01-08 DIAGNOSIS — Z23 Encounter for immunization: Secondary | ICD-10-CM

## 2018-01-08 DIAGNOSIS — K2100 Gastro-esophageal reflux disease with esophagitis, without bleeding: Secondary | ICD-10-CM

## 2018-01-08 DIAGNOSIS — Z79899 Other long term (current) drug therapy: Secondary | ICD-10-CM

## 2018-01-08 DIAGNOSIS — D500 Iron deficiency anemia secondary to blood loss (chronic): Secondary | ICD-10-CM

## 2018-01-08 DIAGNOSIS — Z7960 Long term (current) use of unspecified immunomodulators and immunosuppressants: Secondary | ICD-10-CM

## 2018-01-08 DIAGNOSIS — K5010 Crohn's disease of large intestine without complications: Secondary | ICD-10-CM

## 2018-01-08 NOTE — Patient Instructions (Addendum)
  Please come the first week of August and have labs drawn.  Our lab is open 7:30AM-5:30PM Mon-Friday. No appointment needed.  Patient took the orders to have drawn at  [employer] [employer] [facility] [employer] where he works.   Today you have been given a Prevnar 13 vaccine.    I appreciate the opportunity to care for you. Silvano Rusk, MD, [facility]

## 2018-01-08 NOTE — Assessment & Plan Note (Signed)
Not anemic now we will check ferritin with next labs in August

## 2018-01-08 NOTE — Assessment & Plan Note (Addendum)
Doing well we will continue current dosing of 6-MP and Lialda.  Periodic labs 3 times a year because of 6-MP use.  Prevnar 13 vaccine now.  Pneumovax later in the year. RTC 1 year and consider a routine colonoscopy at that time which would be about 8 years after his diagnosis

## 2018-01-08 NOTE — Assessment & Plan Note (Signed)
Continue PPI and H2 blocker

## 2018-01-08 NOTE — Progress Notes (Signed)
Michael Moon 22 y.o. 1996/04/07 [id number] Referred by: Tammi Sou, MD  Assessment & Plan:  Crohn's disease of colon [facility]) Doing well we will continue current dosing of 6-MP and Lialda.  Periodic labs 3 times a year because of 6-MP use.  Prevnar 13 vaccine now.  Pneumovax later in the year. RTC 1 year  GERD (gastroesophageal reflux disease) Continue PPI and H2 blocker  Iron deficiency anemia Not anemic now we will check ferritin with next labs in August   I appreciate the opportunity to care for this patient. 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 Elzie Rings fell.  He is on 6-mercaptopurine and Lialda and is doing well without any significant symptoms about 3-5 times a year he will get some mild rectal pain and discomfort with bleeding consistent with his known anal fissure.  He uses Analpram cream with relief.  Has 1-2 stools a day which are formed.  Has not had a colonoscopy since diagnosis in 2012.  But has been in remission for some time now.  Diagnosed 2012 colonoscopy with mild active chronic colitis with crypt distortion and abscess formation but no granulomas he had bloody diarrhea and a 40 pound weight loss also had reflux esophagitis Initially treated with steroids and Asacol and 6-MP with a hospitalization for IV steroids which were then tapered off. 6-MP and Asacol for several years, 2014 stopped a sick call, mild symptoms, Lialda added after that and has done well since  He has GERD and is treated with omeprazole 40 mg daily and then ranitidine 150 mg in the evening with good relief.  He saw Dr. Madelaine Etienne in April, and he had a normal CBC save for slight thrombocytosis at 494 and a normal CMET  and thyroid testing.   Allergies  Allergen Reactions  . Codeine Rash   Current Meds  Medication Sig  . cetirizine (ZYRTEC) 10 MG tablet Take 10 mg by mouth daily.  . mercaptopurine (PURINETHOL) 50 MG tablet Take 100 mg by mouth daily. Give on an empty stomach 1 hour before or 2 hours after meals. Caution: Chemotherapy.  . mesalamine (LIALDA) 1.2 g EC tablet Take 4.8 g by mouth daily with breakfast.  . omeprazole (PRILOSEC) 40 MG capsule Take 40 mg by mouth daily.  . ranitidine (ZANTAC) 150 MG capsule Take 150 mg by mouth every evening.   Past Medical History:  Diagnosis Date  . Anal fissure   . Anemia   . Crohn disease ([place]) dx 2012  . GERD (gastroesophageal reflux disease)   . Obesity, Class I, BMI 30-34.9   . Seasonal allergic rhinitis    Past Surgical History:  Procedure Laterality Date  . COLONOSCOPY  2012   crohns  . ESOPHAGOGASTRODUODENOSCOPY  2012   +crohns  . TONSILLECTOMY AND ADENOIDECTOMY  08/13/2005   Social History   Social History Narrative   Single, no children.   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   No T/A/Ds.   family history includes Alcohol abuse in his maternal grandfather, paternal grandfather, and paternal grandmother; Arthritis in his maternal grandmother and mother; Brain cancer in his father; Diabetes in his maternal grandmother; Early death in his paternal grandfather; Hearing loss in his maternal  grandfather; Hyperlipidemia in his father; Hypertension in his father, maternal grandmother, mother, and paternal grandmother; Stroke in his maternal grandfather and maternal grandmother.   Review of Systems  As per HPI allergy and sinus problems all other review of systems are negative Objective:   Physical Exam @BP  132/88   Pulse 76   Ht 6' (1.829 m)   Wt 262 lb (118.8 kg)   BMI 35.53 kg/m @  General:  Well-developed, well-nourished and in no acute distress Eyes:  anicteric. ENT:   Mouth and posterior  pharynx free of lesions.  Neck:   supple w/o thyromegaly or mass.  Lungs: Clear to auscultation bilaterally. Heart:  S1S2, no rubs, murmurs, gallops. Abdomen:  soft, non-tender, no hepatosplenomegaly, hernia, or mass and BS+.  Rectal: NL anoderm, nontender no mass no active fissure Lymph:  no cervical or supraclavicular adenopathy. Extremities:   no edema, cyanosis or clubbing Skin   no rash. Neuro:  A&O x 3.  Psych:  appropriate mood and  Affect.   Data Reviewed:  Recent 2018 2017 records from [facility] regarding his IBD treatment and care and labs in the EMR from 2019 and primary care notes

## 2018-01-20 ENCOUNTER — Encounter: Payer: Self-pay | Admitting: Family Medicine

## 2018-03-03 ENCOUNTER — Telehealth: Payer: Self-pay | Admitting: Internal Medicine

## 2018-03-03 ENCOUNTER — Other Ambulatory Visit
Admission: RE | Admit: 2018-03-03 | Discharge: 2018-03-03 | Disposition: A | Discharge status: Home / Self Care | Payer: 59 | Source: Ambulatory Visit | Attending: Internal Medicine | Admitting: Internal Medicine

## 2018-03-03 DIAGNOSIS — K5010 Crohn's disease of large intestine without complications: Secondary | ICD-10-CM | POA: Diagnosis not present

## 2018-03-03 DIAGNOSIS — Z79899 Other long term (current) drug therapy: Secondary | ICD-10-CM | POA: Insufficient documentation

## 2018-03-03 DIAGNOSIS — Z7960 Long term (current) use of unspecified immunomodulators and immunosuppressants: Secondary | ICD-10-CM

## 2018-03-03 LAB — COMPREHENSIVE METABOLIC PANEL
ALBUMIN: 4.5 g/dL (ref 3.5–5.0)
ALK PHOS: 61 U/L (ref 38–126)
ALT: 43 U/L (ref 0–44)
ANION GAP: 9 (ref 5–15)
AST: 25 U/L (ref 15–41)
BUN: 9 mg/dL (ref 6–20)
CALCIUM: 9.2 mg/dL (ref 8.9–10.3)
CO2: 27 mmol/L (ref 22–32)
Chloride: 103 mmol/L (ref 98–111)
Creatinine, Ser: 1.04 mg/dL (ref 0.61–1.24)
GFR calc Af Amer: >60 mL/min (ref >60)
GFR calc non Af Amer: >60 mL/min (ref >60)
GLUCOSE: 92 mg/dL (ref 70–99)
Potassium: 3.6 mmol/L (ref 3.5–5.1)
SODIUM: 139 mmol/L (ref 135–145)
Total Bilirubin: 0.6 mg/dL (ref 0.3–1.2)
Total Protein: 7.9 g/dL (ref 6.5–8.1)

## 2018-03-03 LAB — CBC WITH DIFFERENTIAL/PLATELET
BASOS ABS: 0.1 10*3/uL (ref 0–0.1)
BASOS PCT: 1 %
Eosinophils Absolute: 0.1 10*3/uL (ref 0–0.7)
Eosinophils Relative: 2 %
HEMATOCRIT: 41.5 % (ref 40.0–52.0)
HEMOGLOBIN: 14.3 g/dL (ref 13.0–18.0)
Lymphocytes Relative: 20 %
Lymphs Abs: 1.3 10*3/uL (ref 1.0–3.6)
MCH: 28.6 pg (ref 26.0–34.0)
MCHC: 34.4 g/dL (ref 32.0–36.0)
MCV: 82.9 fL (ref 80.0–100.0)
Monocytes Absolute: 0.5 10*3/uL (ref 0.2–1.0)
Monocytes Relative: 8 %
NEUTROS ABS: 4.6 10*3/uL (ref 1.4–6.5)
NEUTROS PCT: 69 %
Platelets: 427 10*3/uL (ref 150–440)
RBC: 5.01 MIL/uL (ref 4.40–5.90)
RDW: 14.4 % (ref 11.5–14.5)
WBC: 6.6 10*3/uL (ref 3.8–10.6)

## 2018-03-03 LAB — C-REACTIVE PROTEIN: CRP: 1.1 mg/dL — ABNORMAL HIGH (ref <1.0)

## 2018-03-03 LAB — FERRITIN: Ferritin: 114 ng/mL (ref 24–336)

## 2018-03-03 LAB — BILIRUBIN, DIRECT: Bilirubin, Direct: 0.1 mg/dL (ref 0.0–0.2)

## 2018-03-03 MED ORDER — PREDNISONE 10 MG PO TABS: 40.0000 mg | ORAL_TABLET | Freq: Every day | ORAL | 0 refills | Status: DC

## 2018-03-03 MED ORDER — DICYCLOMINE HCL 20 MG PO TABS: 20.0000 mg | ORAL_TABLET | Freq: Four times a day (QID) | ORAL | 1 refills | Status: DC | PRN

## 2018-03-03 NOTE — Telephone Encounter (Signed)
Patient notified of the results and recommendations Labs entered. He would like to try and have them done at [facility].  He will let me know if they can't see the orders rx sent

## 2018-03-03 NOTE — Telephone Encounter (Signed)
Sorry to hear he is not well.  1) Prednisone 10 mg tabs # 100 no refill Take 4 each day for now until instructed otherwise  2) Stool C diff and GI pathogen panel today/tomorrow p[lease  3) Dicyclomine 20 mg q 6 prn cramps/diarrhea # 90 1 refill  4) BRAT diet, hydrate well  5)  CBC, CRP, CMEt  6) will figure out f/u pending results

## 2018-03-03 NOTE — Telephone Encounter (Signed)
Patient reports 1 week of diarrhea and abdominal pain.  Reports 5 episodes of diarrhea a day.  He denies vomiting, but does have some nausea.  Denies rectal bleeding.  He is maintained on 58mp 100 mg daily and lialda 4.8 Gm daily.  Please advise.  No APP appts this week.

## 2018-03-04 ENCOUNTER — Encounter: Payer: Self-pay | Admitting: Internal Medicine

## 2018-03-04 ENCOUNTER — Other Ambulatory Visit
Admission: RE | Admit: 2018-03-04 | Discharge: 2018-03-04 | Disposition: A | Discharge status: Home / Self Care | Payer: 59 | Source: Ambulatory Visit | Attending: Internal Medicine | Admitting: Internal Medicine

## 2018-03-04 ENCOUNTER — Telehealth: Payer: Self-pay

## 2018-03-04 DIAGNOSIS — K5010 Crohn's disease of large intestine without complications: Secondary | ICD-10-CM | POA: Insufficient documentation

## 2018-03-04 LAB — GASTROINTESTINAL PANEL BY PCR, STOOL (REPLACES STOOL CULTURE)

## 2018-03-04 LAB — C DIFFICILE QUICK SCREEN W PCR REFLEX
C DIFFICILE (CDIFF) TOXIN: NEGATIVE
C DIFFICLE (CDIFF) ANTIGEN: NEGATIVE
C Diff interpretation: NOT DETECTED

## 2018-03-04 MED ORDER — MERCAPTOPURINE 50 MG PO TABS: 100.0000 mg | ORAL_TABLET | Freq: Every day | ORAL | 0 refills | Status: DC

## 2018-03-04 MED ORDER — MESALAMINE 1.2 G PO TBEC: 4.8000 g | DELAYED_RELEASE_TABLET | Freq: Every day | ORAL | 3 refills | Status: DC

## 2018-03-04 MED ORDER — OMEPRAZOLE 40 MG PO CPDR: 40.0000 mg | DELAYED_RELEASE_CAPSULE | Freq: Every day | ORAL | 3 refills | Status: DC

## 2018-03-04 NOTE — Progress Notes (Signed)
Labs ok My Chart message

## 2018-03-04 NOTE — Telephone Encounter (Signed)
Pharmacy requesting refills on patients following meds, please advise Sir. 1.  6MP, 50mg , 2 tablets qd 2. Omeprazole 40mg  , one qd 3. Lialda 1.2 gm , 4 tablets qd   I see in Epic patient had testing done yesterday.

## 2018-03-04 NOTE — Telephone Encounter (Signed)
Rx's refilled as approved.

## 2018-03-04 NOTE — Telephone Encounter (Signed)
Refill x 3 mos 6MP Refill others x 1 year

## 2018-03-05 NOTE — Progress Notes (Signed)
Stop prednisone My Chart note

## 2018-06-04 ENCOUNTER — Other Ambulatory Visit: Payer: Self-pay | Admitting: Internal Medicine

## 2018-06-04 DIAGNOSIS — Z7960 Long term (current) use of unspecified immunomodulators and immunosuppressants: Secondary | ICD-10-CM

## 2018-06-04 DIAGNOSIS — Z79899 Other long term (current) drug therapy: Secondary | ICD-10-CM

## 2018-06-04 NOTE — Telephone Encounter (Signed)
Refill Sir? Thank you.

## 2018-06-05 ENCOUNTER — Other Ambulatory Visit: Payer: Self-pay | Admitting: Internal Medicine

## 2018-06-05 NOTE — Telephone Encounter (Signed)
Refill x 3 mos He needs to do LFT's and a CBC - dx long-term use immunosuppressants

## 2018-06-05 NOTE — Telephone Encounter (Signed)
  Left patient a detailed voice mail to come get labwork done , orders put in.

## 2018-06-13 ENCOUNTER — Other Ambulatory Visit
Admission: RE | Admit: 2018-06-13 | Discharge: 2018-06-13 | Disposition: A | Discharge status: Home / Self Care | Payer: 59 | Source: Ambulatory Visit | Attending: Internal Medicine | Admitting: Internal Medicine

## 2018-06-13 DIAGNOSIS — Z7960 Long term (current) use of unspecified immunomodulators and immunosuppressants: Secondary | ICD-10-CM

## 2018-06-13 DIAGNOSIS — Z79899 Other long term (current) drug therapy: Secondary | ICD-10-CM | POA: Insufficient documentation

## 2018-06-13 LAB — HEPATIC FUNCTION PANEL
ALT: 41 U/L (ref 0–44)
AST: 24 U/L (ref 15–41)
Albumin: 4.4 g/dL (ref 3.5–5.0)
Alkaline Phosphatase: 48 U/L (ref 38–126)
Total Bilirubin: 0.7 mg/dL (ref 0.3–1.2)
Total Protein: 8 g/dL (ref 6.5–8.1)

## 2018-06-13 LAB — CBC WITH DIFFERENTIAL/PLATELET
ABS IMMATURE GRANULOCYTES: 0.03 10*3/uL (ref 0.00–0.07)
BASOS PCT: 0 %
Basophils Absolute: 0 10*3/uL (ref 0.0–0.1)
Eosinophils Absolute: 0 10*3/uL (ref 0.0–0.5)
Eosinophils Relative: 0 %
HEMATOCRIT: 45.7 % (ref 39.0–52.0)
HEMOGLOBIN: 14.7 g/dL (ref 13.0–17.0)
Immature Granulocytes: 0 %
Lymphocytes Relative: 21 %
Lymphs Abs: 1.4 10*3/uL (ref 0.7–4.0)
MCH: 27.7 pg (ref 26.0–34.0)
MCHC: 32.2 g/dL (ref 30.0–36.0)
MCV: 86.2 fL (ref 80.0–100.0)
MONO ABS: 0.5 10*3/uL (ref 0.1–1.0)
Monocytes Relative: 7 %
NEUTROS ABS: 4.8 10*3/uL (ref 1.7–7.7)
Neutrophils Relative %: 72 %
Platelets: 434 10*3/uL — ABNORMAL HIGH (ref 150–400)
RBC: 5.3 MIL/uL (ref 4.22–5.81)
RDW: 14 % (ref 11.5–15.5)
WBC: 6.7 10*3/uL (ref 4.0–10.5)
nRBC: 0 % (ref 0.0–0.2)

## 2018-06-15 ENCOUNTER — Other Ambulatory Visit: Payer: Self-pay | Admitting: Internal Medicine

## 2018-06-15 DIAGNOSIS — Z79899 Other long term (current) drug therapy: Secondary | ICD-10-CM

## 2018-06-15 DIAGNOSIS — Z7960 Long term (current) use of unspecified immunomodulators and immunosuppressants: Secondary | ICD-10-CM

## 2018-06-15 NOTE — Progress Notes (Signed)
Labs ok Repeat in 4 mos My Chart

## 2018-07-02 ENCOUNTER — Encounter: Payer: Self-pay | Admitting: *Deleted

## 2018-09-04 ENCOUNTER — Other Ambulatory Visit: Payer: Self-pay | Admitting: Internal Medicine

## 2018-12-17 ENCOUNTER — Other Ambulatory Visit: Payer: Self-pay | Admitting: Internal Medicine

## 2018-12-17 DIAGNOSIS — K5150 Left sided colitis without complications: Secondary | ICD-10-CM

## 2018-12-31 ENCOUNTER — Other Ambulatory Visit: Payer: Self-pay | Admitting: Internal Medicine

## 2018-12-31 ENCOUNTER — Other Ambulatory Visit (INDEPENDENT_AMBULATORY_CARE_PROVIDER_SITE_OTHER): Payer: 59

## 2018-12-31 DIAGNOSIS — Z79899 Other long term (current) drug therapy: Secondary | ICD-10-CM

## 2018-12-31 DIAGNOSIS — Z7960 Long term (current) use of unspecified immunomodulators and immunosuppressants: Secondary | ICD-10-CM

## 2018-12-31 DIAGNOSIS — K5150 Left sided colitis without complications: Secondary | ICD-10-CM

## 2018-12-31 LAB — CBC WITH DIFFERENTIAL/PLATELET
Basophils Absolute: 0 10*3/uL (ref 0.0–0.1)
Basophils Relative: 0.3 % (ref 0.0–3.0)
Eosinophils Absolute: 0.1 10*3/uL (ref 0.0–0.7)
Eosinophils Relative: 0.8 % (ref 0.0–5.0)
HCT: 43.5 % (ref 39.0–52.0)
Hemoglobin: 14.8 g/dL (ref 13.0–17.0)
Lymphocytes Relative: 21.4 % (ref 12.0–46.0)
Lymphs Abs: 1.9 10*3/uL (ref 0.7–4.0)
MCHC: 34.1 g/dL (ref 30.0–36.0)
MCV: 83.2 fl (ref 78.0–100.0)
Monocytes Absolute: 0.6 10*3/uL (ref 0.1–1.0)
Monocytes Relative: 6.4 % (ref 3.0–12.0)
Neutro Abs: 6.4 10*3/uL (ref 1.4–7.7)
Neutrophils Relative %: 71.1 % (ref 43.0–77.0)
Platelets: 391 10*3/uL (ref 150.0–400.0)
RBC: 5.23 Mil/uL (ref 4.22–5.81)
RDW: 14.1 % (ref 11.5–15.5)
WBC: 9.1 10*3/uL (ref 4.0–10.5)

## 2018-12-31 LAB — COMPREHENSIVE METABOLIC PANEL
ALT: 35 U/L (ref 0–53)
AST: 19 U/L (ref 0–37)
Albumin: 4.4 g/dL (ref 3.5–5.2)
Alkaline Phosphatase: 57 U/L (ref 39–117)
BUN: 14 mg/dL (ref 6–23)
CO2: 28 mEq/L (ref 19–32)
Calcium: 9.3 mg/dL (ref 8.4–10.5)
Chloride: 101 mEq/L (ref 96–112)
Creatinine, Ser: 0.9 mg/dL (ref 0.40–1.50)
GFR: 102.72 mL/min (ref >60.00)
Glucose, Bld: 95 mg/dL (ref 70–99)
Potassium: 3.9 mEq/L (ref 3.5–5.1)
Sodium: 138 mEq/L (ref 135–145)
Total Bilirubin: 0.4 mg/dL (ref 0.2–1.2)
Total Protein: 7.4 g/dL (ref 6.0–8.3)

## 2018-12-31 LAB — FERRITIN: Ferritin: 119.8 ng/mL (ref 22.0–322.0)

## 2018-12-31 MED ORDER — MERCAPTOPURINE 50 MG PO TABS: ORAL_TABLET | ORAL | 0 refills | Status: DC

## 2018-12-31 NOTE — Progress Notes (Signed)
Keenen,  Labs are fine.  I will refill the 6MP and ask that you repeat labs again in early September. Pleasse mark your phone calendar or something to remember.  I would also like for you to schedule a follow-up visit - we can do telehealth. Please call and set that up.  Thanks and stay well.  Gatha Mayer, MD, [facility]

## 2019-01-23 ENCOUNTER — Other Ambulatory Visit: Payer: Self-pay

## 2019-01-23 ENCOUNTER — Ambulatory Visit (INDEPENDENT_AMBULATORY_CARE_PROVIDER_SITE_OTHER): Payer: 59 | Admitting: Internal Medicine

## 2019-01-23 ENCOUNTER — Encounter: Payer: Self-pay | Admitting: Internal Medicine

## 2019-01-23 DIAGNOSIS — Z79899 Other long term (current) drug therapy: Secondary | ICD-10-CM | POA: Diagnosis not present

## 2019-01-23 DIAGNOSIS — D500 Iron deficiency anemia secondary to blood loss (chronic): Secondary | ICD-10-CM

## 2019-01-23 DIAGNOSIS — Z7960 Long term (current) use of unspecified immunomodulators and immunosuppressants: Secondary | ICD-10-CM | POA: Insufficient documentation

## 2019-01-23 DIAGNOSIS — K5010 Crohn's disease of large intestine without complications: Secondary | ICD-10-CM | POA: Diagnosis not present

## 2019-01-23 NOTE — Progress Notes (Signed)
    TELEHEALTH ENCOUNTER IN SETTING OF COVID-19 PANDEMIC - REQUESTED BY PATIENT SERVICE PROVIDED BY TELEMEDECINE - TYPE: Zoom AV PATIENT LOCATION: Home PATIENT HAS CONSENTED TO TELEHEALTH VISIT PROVIDER LOCATION: OFFICE REFERRING PROVIDER:n/A PARTICIPANTS OTHER THAN PATIENT:NONE TIME SPENT ON CALL:8 mins   Michael Moon 23 y.o. 1995/09/16 [id number]  Assessment & Plan:   Crohn's disease of colon ([employer]) Doing well - continue Lialda and 6 MP 8 yrs since dx Will schedule colonoscopy for surveillance  The risks and benefits as well as alternatives of endoscopic procedure(s) have been discussed and reviewed. All questions answered. The patient agrees to proceed.   Iron deficiency anemia Lab Results  Component Value Date   FERRITIN 119.8 12/31/2018     Long-term use of immunosuppressant medication - 6 mercaptopurine Labs ok No problems noted Pneumovax is appropriate - will ask him to do When he comes for colonoscopy will review sun exposure risks.   dermatology baseline exam is reasonable.  I appreciate the opportunity to care for this patient. CC: Moon, Michael Blackwater, MD    Subjective:   Chief Complaint: Follow-up of Crohn's colitis  HPI Michael Moon is a 23 year old white man with Crohn's disease of the colon diagnosed around 2012.  He has been maintained on 6-MP and Lialda and is done well.  He first presented to me to establish care about a year ago.  Recent ferritin was normal CBC and LFTs were normal as well.   Allergies  Allergen Reactions  . Codeine Rash   Current Meds  Medication Sig  . cetirizine (ZYRTEC) 10 MG tablet Take 10 mg by mouth daily.  . mercaptopurine (PURINETHOL) 50 MG tablet TAKE 2 TABLETS BY MOUTH DAILY. GIVE ON AN EMPTY STOMACH 1 HOUR BEFORE OR 2 HOURS AFTER MEALS. CAUTION: CHEMOTHERAPY. NEED LABS  . mesalamine (LIALDA) 1.2 g EC tablet Take 4 tablets (4.8 g total) by mouth daily with breakfast.  . omeprazole (PRILOSEC) 40 MG capsule Take 1 capsule (40  mg total) by mouth daily.   Past Medical History:  Diagnosis Date  . Anal fissure   . Anemia   . Crohn disease ([place]) dx 2012   Doing well on lialda and mercaptopurine as of 01/2018 (Dr. Carlean Purl)  . GERD (gastroesophageal reflux disease)   . Obesity, Class I, BMI 30-34.9   . Seasonal allergic rhinitis    Past Surgical History:  Procedure Laterality Date  . COLONOSCOPY  2012   crohns  . ESOPHAGOGASTRODUODENOSCOPY  2012  . TONSILLECTOMY AND ADENOIDECTOMY  08/13/2005   Social History   Social History Narrative   Single, no children.   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   No T/A/Ds.   Lives with mom in [place]   family history includes Alcohol abuse in his maternal grandfather, paternal grandfather, and paternal grandmother; Arthritis in his maternal grandmother and mother; Brain cancer in his father; Diabetes in his maternal grandmother; Early death in his paternal grandfather; Hearing loss in his maternal grandfather; Hyperlipidemia in his father; Hypertension in his father, maternal grandmother, mother, and paternal grandmother; Stroke in his maternal grandfather and maternal grandmother.   Review of Systems Otherwise negative

## 2019-01-23 NOTE — Assessment & Plan Note (Signed)
Doing well - continue Lialda and 6 MP 8 yrs since dx Will schedule colonoscopy for surveillance  The risks and benefits as well as alternatives of endoscopic procedure(s) have been discussed and reviewed. All questions answered. The patient agrees to proceed.

## 2019-01-23 NOTE — Assessment & Plan Note (Addendum)
Labs ok No problems noted Pneumovax is appropriate - will ask him to do When he comes for colonoscopy will review sun exposure risks.   dermatology baseline exam is reasonable.

## 2019-01-23 NOTE — Assessment & Plan Note (Signed)
Lab Results  Component Value Date   FERRITIN 119.8 12/31/2018

## 2019-01-23 NOTE — Patient Instructions (Signed)
It was good to zoom with you today Michael Moon.  I am glad to hear that your Crohn's disease seems to be in remission.  As we discussed, it is an appropriate time for you to have a colonoscopy to check on the Crohn's disease in the colon.  An upper endoscopy is not necessary.   We will arrange for you to have this procedure done.   Something else that is appropriate is for you to have an additional pneumonia vaccine called Pneumovax.  We can arrange this for you at your convenience.  It is not urgent but requires coming in for the injection.  I appreciate the opportunity to care for you. Gatha Mayer, MD, [facility]

## 2019-02-09 ENCOUNTER — Telehealth: Payer: Self-pay

## 2019-02-09 DIAGNOSIS — K5010 Crohn's disease of large intestine without complications: Secondary | ICD-10-CM

## 2019-02-09 NOTE — Telephone Encounter (Signed)
Michael Moon called back and we set up his pneumovac 23 and colonoscopy appointments. When he comes tomorrow for his shot I will give him his instructions.

## 2019-02-09 NOTE — Telephone Encounter (Signed)
Trying to reach [facility] to set up his colonoscopy and pneumovax 23 injection. He had a telemed visit on 01/23/2019 and I was unable to reach him back that day. We will await his call.

## 2019-02-10 ENCOUNTER — Ambulatory Visit (INDEPENDENT_AMBULATORY_CARE_PROVIDER_SITE_OTHER): Payer: 59 | Admitting: Internal Medicine

## 2019-02-10 DIAGNOSIS — Z23 Encounter for immunization: Secondary | ICD-10-CM

## 2019-02-10 DIAGNOSIS — K5010 Crohn's disease of large intestine without complications: Secondary | ICD-10-CM

## 2019-02-25 ENCOUNTER — Ambulatory Visit: Payer: 59 | Admitting: Family Medicine

## 2019-02-25 ENCOUNTER — Encounter: Payer: Self-pay | Admitting: Family Medicine

## 2019-02-25 ENCOUNTER — Other Ambulatory Visit: Payer: Self-pay

## 2019-02-25 VITALS — BP 141/82 | HR 91 | Temp 98.7°F | Resp 16 | Ht 72.0 in | Wt 273.6 lb

## 2019-02-25 DIAGNOSIS — E6609 Other obesity due to excess calories: Secondary | ICD-10-CM | POA: Insufficient documentation

## 2019-02-25 DIAGNOSIS — K5090 Crohn's disease, unspecified, without complications: Secondary | ICD-10-CM

## 2019-02-25 DIAGNOSIS — R030 Elevated blood-pressure reading, without diagnosis of hypertension: Secondary | ICD-10-CM

## 2019-02-25 DIAGNOSIS — Z0000 Encounter for general adult medical examination without abnormal findings: Secondary | ICD-10-CM

## 2019-02-25 DIAGNOSIS — E669 Obesity, unspecified: Secondary | ICD-10-CM

## 2019-02-25 DIAGNOSIS — E782 Mixed hyperlipidemia: Secondary | ICD-10-CM

## 2019-02-25 NOTE — Progress Notes (Signed)
Office Note 02/25/2019  CC:  Chief Complaint  Patient presents with  . Annual Exam    pt is not fasting    HPI:  Michael Moon is a 23 y.o. White male who is here for annual health maintenance exam.  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 No exercise.  No dietary mod lately: added 10 lbs over the last few months of quarantine.  He has crohn's disease He last saw his GI  MD, Dr. Carlean Purl, on 01/23/19 and all was going fine.  No med changes. The plan was to schedule a surveillance colonoscopy.  Has mild bp elevation here today.  Review of past bp's in EMR show similar measurments, highest 140/100 back in 07/2017 and 153/91 08/2016.  HR 70s-90s.  Past Medical History:  Diagnosis Date  . Anal fissure   . Anemia   . Crohn disease ([place]) dx 2012   Doing well on lialda and mercaptopurine as of 01/2018 (Dr. Carlean Purl)  . GERD (gastroesophageal reflux disease)   . Obesity, Class I, BMI 30-34.9   . Seasonal allergic rhinitis     Past Surgical History:  Procedure Laterality Date  . COLONOSCOPY  2012   crohns  . ESOPHAGOGASTRODUODENOSCOPY  2012  . TONSILLECTOMY AND ADENOIDECTOMY  08/13/2005    Family History  Problem Relation Age of Onset  . Arthritis Mother   . Hypertension Mother   . Brain cancer Father   . Hyperlipidemia Father   . Hypertension Father   . Arthritis Maternal Grandmother   . Diabetes Maternal Grandmother   . Hypertension Maternal Grandmother   . Stroke Maternal Grandmother   . Stroke Maternal Grandfather   . Hearing loss Maternal Grandfather   . Alcohol abuse Maternal Grandfather   . Alcohol abuse Paternal Grandmother   . Hypertension Paternal Grandmother   . Alcohol abuse Paternal Grandfather   . Early death Paternal Grandfather        unknown cause    Social History   Socioeconomic History  . Marital status: Single    Spouse name: Not on file  . Number of children: 0  . Years of education: Not on file  . Highest education level: Not on  file  Occupational History  . Occupation: MRI Psychologist, clinical: [employer]  . Financial resource strain: Not on file  . Food insecurity    Worry: Not on file    Inability: Not on file  . Transportation needs    Medical: Not on file    Non-medical: Not on file  Tobacco Use  . Smoking status: Never Smoker  . Smokeless tobacco: Never Used  Substance and Sexual Activity  . Alcohol use: No  . Drug use: No  . Sexual activity: Not on file  Lifestyle  . Physical activity    Days per week: Not on file    Minutes per session: Not on file  . Stress: Not on file  Relationships  . Social Herbalist on phone: Not on file    Gets together: Not on file    Attends religious service: Not on file    Active member of club or organization: Not on file    Attends meetings of clubs or organizations: Not on file    Relationship status: Not on file  . Intimate partner violence    Fear of current or ex partner: Not on file    Emotionally abused: Not on file    Physically  abused: Not on file    Forced sexual activity: Not on file  Other Topics Concern  . Not on file  Social History Narrative   Single, no children.   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   No T/A/Ds.   Lives with mom in [place]    Outpatient Medications Prior to Visit  Medication Sig Dispense Refill  . cetirizine (ZYRTEC) 10 MG tablet Take 10 mg by mouth daily.    . mercaptopurine (PURINETHOL) 50 MG tablet TAKE 2 TABLETS BY MOUTH DAILY. GIVE ON AN EMPTY STOMACH 1 HOUR BEFORE OR 2 HOURS AFTER MEALS. CAUTION: CHEMOTHERAPY. NEED LABS 180 tablet 0  . mesalamine (LIALDA) 1.2 g EC tablet Take 4 tablets (4.8 g total) by mouth daily with breakfast. 360 tablet 3  . omeprazole (PRILOSEC) 40 MG capsule Take 1 capsule (40 mg total) by mouth daily. 90 capsule 3   No facility-administered medications prior to visit.     Allergies  Allergen Reactions  . Codeine  Rash    ROS Review of Systems  Constitutional: Negative for appetite change, chills, fatigue and fever.  HENT: Negative for congestion, dental problem, ear pain and sore throat.   Eyes: Negative for discharge, redness and visual disturbance.  Respiratory: Negative for cough, chest tightness, shortness of breath and wheezing.   Cardiovascular: Negative for chest pain, palpitations and leg swelling.  Gastrointestinal: Negative for abdominal pain, blood in stool, diarrhea, nausea and vomiting.  Genitourinary: Negative for difficulty urinating, dysuria, flank pain, frequency, hematuria and urgency.  Musculoskeletal: Negative for arthralgias, back pain, joint swelling, myalgias and neck stiffness.  Skin: Negative for pallor and rash.  Neurological: Negative for dizziness, speech difficulty, weakness and headaches.  Hematological: Negative for adenopathy. Does not bruise/bleed easily.  Psychiatric/Behavioral: Negative for confusion and sleep disturbance. The patient is not nervous/anxious.     PE; Blood pressure (!) 141/82, pulse 91, temperature 98.7 F (37.1 C), temperature source Temporal, resp. rate 16, height 6' (1.829 m), weight 273 lb 9.6 oz (124.1 kg), SpO2 95 %.Body mass index is 37.11 kg/m.  Gen: Alert, well appearing.  Patient is oriented to person, place, time, and situation. AFFECT: pleasant, lucid thought and speech. ENT: Ears: EACs clear, normal epithelium.  TMs with good light reflex and landmarks bilaterally.  Eyes: no injection, icteris, swelling, or exudate.  EOMI, PERRLA. Nose: no drainage or turbinate edema/swelling.  No injection or focal lesion.  Mouth: lips without lesion/swelling.  Oral mucosa pink and moist.  Dentition intact and without obvious caries or gingival swelling.  Oropharynx without erythema, exudate, or swelling.  Neck: supple/nontender.  No LAD, mass, or TM.  Carotid pulses 2+ bilaterally, without bruits. CV: RRR, no m/r/g.   LUNGS: CTA bilat, nonlabored  resps, good aeration in all lung fields. ABD: soft, NT, ND, BS normal.  No hepatospenomegaly or mass.  No bruits. EXT: no clubbing, cyanosis, or edema.  Musculoskeletal: no joint swelling, erythema, warmth, or tenderness.  ROM of all joints intact. Skin - no sores or suspicious lesions or rashes or color changes   Pertinent labs:  Lab Results  Component Value Date   TSH 1.56 11/11/2017   Lab Results  Component Value Date   WBC 9.1 12/31/2018   HGB 14.8 12/31/2018   HCT 43.5 12/31/2018   MCV 83.2 12/31/2018   PLT 391.0 12/31/2018   Lab Results  Component Value Date   CREATININE 0.90 12/31/2018   BUN 14 12/31/2018   NA 138 12/31/2018  K 3.9 12/31/2018   CL 101 12/31/2018   CO2 28 12/31/2018   Lab Results  Component Value Date   ALT 35 12/31/2018   AST 19 12/31/2018   ALKPHOS 57 12/31/2018   BILITOT 0.4 12/31/2018   Lab Results  Component Value Date   CHOL 204 (H) 11/11/2017   Lab Results  Component Value Date   HDL 38.70 (L) 11/11/2017   No results found for: [facility] Lab Results  Component Value Date   TRIG 242.0 (H) 11/11/2017   Lab Results  Component Value Date   CHOLHDL 5 11/11/2017    ASSESSMENT AND PLAN:   1) Elevated bp w/out dx HTN: he has bp cuff at home, just has to find it. Check bp/hr 3 times per week and bring numbers back for review with me in office in 6 wks. No meds at this time.  Strong FH HTN.  2) Health maintenance exam: Reviewed age and gender appropriate health maintenance issues (prudent diet, regular exercise, health risks of tobacco and excessive alcohol, use of seatbelts, fire alarms in home, use of sunscreen).  Also reviewed age and gender appropriate health screening as well as vaccine recommendations. Vaccines: all UTD. Labs: TSH and FLP-->we'll do these when he returns for f/u in 6 wks to go over BPs.  CMET and CBC w/diff normal 12/31/18 via GI).  3) Crohns dz: surveillance colonoscopy upcoming via GI--set for 03/16/19.  An  After Visit Summary was printed and given to the patient.  FOLLOW UP:  Return in about 6 weeks (around 04/08/2019) for f/u elev bp w/out dx of HTN (fasting).  Signed:  Crissie Sickles, MD           02/25/2019

## 2019-03-09 ENCOUNTER — Encounter: Payer: Self-pay | Admitting: Internal Medicine

## 2019-03-09 ENCOUNTER — Other Ambulatory Visit: Payer: Self-pay | Admitting: Internal Medicine

## 2019-03-11 ENCOUNTER — Other Ambulatory Visit: Payer: Self-pay

## 2019-03-11 MED ORDER — MESALAMINE 1.2 G PO TBEC: 4.8000 g | DELAYED_RELEASE_TABLET | Freq: Every day | ORAL | 3 refills | Status: DC

## 2019-03-11 NOTE — Telephone Encounter (Signed)
Michael Moon is up to date on his office visits so Lialda refilled as pharmacy requested.

## 2019-03-13 ENCOUNTER — Telehealth: Payer: Self-pay | Admitting: Internal Medicine

## 2019-03-13 NOTE — Telephone Encounter (Signed)
Left message on vmail for patient regarding Covid-19 screening questions. °Covid-19 Screening Questions: °  °Do you now or have you had a fever in the last 14 days?  °  °Do you have any respiratory symptoms of shortness of breath or cough now or in the last 14 days?  °  °Do you have any family members or close contacts with diagnosed or suspected Covid-19 in the past 14 days?  °  °Have you been tested for Covid-19 and found to be positive?  °  ° °

## 2019-03-13 NOTE — Telephone Encounter (Signed)
Pt returned call and answered “No” to all questions.  °  °Pt made aware of that care partner may come to the lobby during the procedure but will need to provide their own mask. ° ° °

## 2019-03-16 ENCOUNTER — Ambulatory Visit (AMBULATORY_SURGERY_CENTER): Payer: 59 | Admitting: Internal Medicine

## 2019-03-16 ENCOUNTER — Encounter: Payer: Self-pay | Admitting: Internal Medicine

## 2019-03-16 ENCOUNTER — Other Ambulatory Visit: Payer: Self-pay

## 2019-03-16 VITALS — BP 124/66 | HR 77 | Temp 98.6°F | Resp 13 | Ht 72.0 in | Wt 273.0 lb

## 2019-03-16 DIAGNOSIS — D123 Benign neoplasm of transverse colon: Secondary | ICD-10-CM | POA: Diagnosis not present

## 2019-03-16 DIAGNOSIS — D120 Benign neoplasm of cecum: Secondary | ICD-10-CM | POA: Diagnosis not present

## 2019-03-16 DIAGNOSIS — K5010 Crohn's disease of large intestine without complications: Secondary | ICD-10-CM

## 2019-03-16 DIAGNOSIS — K219 Gastro-esophageal reflux disease without esophagitis: Secondary | ICD-10-CM | POA: Diagnosis not present

## 2019-03-16 DIAGNOSIS — K529 Noninfective gastroenteritis and colitis, unspecified: Secondary | ICD-10-CM | POA: Diagnosis not present

## 2019-03-16 DIAGNOSIS — K5090 Crohn's disease, unspecified, without complications: Secondary | ICD-10-CM | POA: Diagnosis not present

## 2019-03-16 MED ORDER — SODIUM CHLORIDE 0.9 % IV SOLN: 500.0000 mL | Freq: Once | INTRAVENOUS | Status: DC

## 2019-03-16 NOTE — Progress Notes (Signed)
Pt's states no medical or surgical changes since previsit or office visit.  June Bullock temps and [place] vitals. SM

## 2019-03-16 NOTE — Progress Notes (Signed)
Report to PACU, RN, vss, BBS= Clear.  

## 2019-03-16 NOTE — Progress Notes (Signed)
Called to room to assist during endoscopic procedure.  Patient ID and intended procedure confirmed with present staff. Received instructions for my participation in the procedure from the performing physician.  

## 2019-03-16 NOTE — Op Note (Signed)
[facility] Patient Name: Michael Moon Procedure Date: 03/16/2019 8:40 AM MRN: [id number] Endoscopist: Gatha Mayer , MD Age: 23 Referring MD:  Date of Birth: [birth year] Gender: Male Account #: [id number] Procedure:                Colonoscopy Indications:              High risk colon cancer surveillance: Crohn's                            colitis of 8 (or more) years duration with                            one-third (or more) of the colon involved Medicines:                Propofol per Anesthesia, Monitored Anesthesia Care Procedure:                Pre-Anesthesia Assessment:                           - Prior to the procedure, a History and Physical                            was performed, and patient medications and                            allergies were reviewed. The patient's tolerance of                            previous anesthesia was also reviewed. The risks                            and benefits of the procedure and the sedation                            options and risks were discussed with the patient.                            All questions were answered, and informed consent                            was obtained. Prior Anticoagulants: The patient has                            taken no previous anticoagulant or antiplatelet                            agents. ASA Grade Assessment: II - A patient with                            mild systemic disease. After reviewing the risks                            and benefits, the patient was deemed in  satisfactory condition to undergo the procedure.                           After obtaining informed consent, the colonoscope                            was passed under direct vision. Throughout the                            procedure, the patient's blood pressure, pulse, and                            oxygen saturations were monitored continuously. The   Colonoscope was introduced through the anus and                            advanced to the the terminal ileum, with                            identification of the appendiceal orifice and IC                            valve. The colonoscopy was performed without                            difficulty. The patient tolerated the procedure                            well. The quality of the bowel preparation was                            excellent. The bowel preparation used was Miralax                            via split dose instruction. The terminal ileum,                            ileocecal valve, appendiceal orifice, and rectum                            were photographed. Scope In: 8:49:34 AM Scope Out: 9:10:05 AM Scope Withdrawal Time: 0 hours 18 minutes 9 seconds  Total Procedure Duration: 0 hours 20 minutes 31 seconds  Findings:                 The perianal and digital rectal examinations were                            normal. Pertinent negatives include normal prostate                            (size, shape, and consistency).                           The terminal ileum appeared normal. This was  biopsied with a cold forceps for Crohn's disease                            surveillance. Verification of patient                            identification for the specimen was done. Estimated                            blood loss was minimal.                           Two sessile polyps were found in the transverse                            colon and ileocecal valve. The polyps were                            diminutive in size. These polyps were removed with                            a cold snare. Resection and retrieval were                            complete. Verification of patient identification                            for the specimen was done. Estimated blood loss was                            minimal.                           Pseudopolyps  and scarring was found as patches                            surrounded by normal mucosa. Two biopsies were                            taken every 10 cm with a cold forceps from the                            cecum, ascending colon, transverse colon,                            descending colon, sigmoid colon and rectum for                            Crohn's disease surveillance. These biopsy                            specimens from the Cecum/ascend, transverse,                            desc/sigmoid,  sigmoid/rectum were sent to                            Pathology. Verification of patient identification                            for the specimen was done. Estimated blood loss was                            minimal.                           The exam was otherwise without abnormality on                            direct and retroflexion views. Complications:            No immediate complications. Estimated Blood Loss:     Estimated blood loss was minimal. Impression:               - The examined portion of the ileum was normal.                            Biopsied.                           - Two diminutive polyps in the transverse colon and                            at the ileocecal valve, removed with a cold snare.                            Resected and retrieved. Normal surrounding tissue                            included in snare removal.                           - Crohn's disease, in remission.                            Scarring/pseudopolyps was found in the colon.                            Biopsied.                           - The examination was otherwise normal on direct                            and retroflexion views. Recommendation:           - Patient has a contact number available for                            emergencies. The signs and symptoms of potential  delayed complications were discussed with the                            patient.  Return to normal activities tomorrow.                            Written discharge instructions were provided to the                            patient.                           - Resume previous diet.                           - Continue present medications.                           - Repeat colonoscopy is recommended for                            surveillance. The colonoscopy date will be                            determined after pathology results from today's                            exam become available for review. Gatha Mayer, MD 03/16/2019 9:21:45 AM This report has been signed electronically.

## 2019-03-16 NOTE — Patient Instructions (Addendum)
I found 2 tiny polyps that I removed - there are numerous other pseudopolyps which are expected and left in.  No active inflammation but can see scarring (pseudopolyps are a form of this).  I took the usual biopsies in a patient with Crohn's colitis - looking for microscopic inflammation and dysplasia (pre-cancerous changed). Nothing unusual to the eye - but standard to do the biopsies.  No clips.  I will let you know pathology results and when to have another routine colonoscopy by mail and/or My Chart.  I appreciate the opportunity to care for you. Gatha Mayer, MD, [place]  Polyp handout given to patient.  Resume previous diet. Continue present medications.  YOU HAD AN ENDOSCOPIC PROCEDURE TODAY AT [facility]:   Refer to the procedure report that was given to you for any specific questions about what was found during the examination.  If the procedure report does not answer your questions, please call your gastroenterologist to clarify.  If you requested that your care partner not be given the details of your procedure findings, then the procedure report has been included in a sealed envelope for you to review at your convenience later.  YOU SHOULD EXPECT: Some feelings of bloating in the abdomen. Passage of more gas than usual.  Walking can help get rid of the air that was put into your GI tract during the procedure and reduce the bloating. If you had a lower endoscopy (such as a colonoscopy or flexible sigmoidoscopy) you may notice spotting of blood in your stool or on the toilet paper. If you underwent a bowel prep for your procedure, you may not have a normal bowel movement for a few days.  Please Note:  You might notice some irritation and congestion in your nose or some drainage.  This is from the oxygen used during your procedure.  There is no need for concern and it should clear up in a day or so.  SYMPTOMS TO REPORT IMMEDIATELY:   Following lower  endoscopy (colonoscopy or flexible sigmoidoscopy):  Excessive amounts of blood in the stool  Significant tenderness or worsening of abdominal pains  Swelling of the abdomen that is new, acute omiting of blood or coffee ground material Fever of 100F or higher For urgent or emergent issues, a gastroenterologist can be reached at any hour by calling [phone].   DIET:  We do recommend a small meal at first, but then you may proceed to your regular diet.  Drink plenty of fluids but you should avoid alcoholic beverages for 24 hours.  ACTIVITY:  You should plan to take it easy for the rest of today and you should NOT DRIVE or use heavy machinery until tomorrow (because of the sedation medicines used during the test).    FOLLOW UP: Our staff will call the number listed on your records 48-72 hours following your procedure to check on you and address any questions or concerns that you may have regarding the information given to you following your procedure. If we do not reach you, we will leave a message.  We will attempt to reach you two times.  During this call, we will ask if you have developed any symptoms of COVID 19. If you develop any symptoms (ie: fever, flu-like symptoms, shortness of breath, cough etc.) before then, please call [phone].  If you test positive for Covid 19 in the 2 weeks post procedure, please call and report this information to [place].    If  any biopsies were taken you will be contacted by phone or by letter within the next 1-3 weeks.  Please call us at [phone] if you have not heard about the biopsies in 3 weeks.    SIGNATURES/CONFIDENTIALITY: You and/or your care partner have signed paperwork which will be entered into your electronic medical record.  These signatures attest to the fact that that the information above on your After Visit Summary has been reviewed and is understood.  Full responsibility of the confidentiality of this discharge information lies with  you and/or your care-partner.

## 2019-03-18 ENCOUNTER — Encounter: Payer: Self-pay | Admitting: Internal Medicine

## 2019-03-18 ENCOUNTER — Telehealth: Payer: Self-pay | Admitting: *Deleted

## 2019-03-18 ENCOUNTER — Other Ambulatory Visit: Payer: Self-pay | Admitting: Internal Medicine

## 2019-03-18 ENCOUNTER — Telehealth: Payer: Self-pay

## 2019-03-18 DIAGNOSIS — Z860101 Personal history of adenomatous and serrated colon polyps: Secondary | ICD-10-CM

## 2019-03-18 DIAGNOSIS — Z86010 Personal history of colonic polyps: Secondary | ICD-10-CM

## 2019-03-18 DIAGNOSIS — Z7960 Long term (current) use of unspecified immunomodulators and immunosuppressants: Secondary | ICD-10-CM

## 2019-03-18 DIAGNOSIS — Z79899 Other long term (current) drug therapy: Secondary | ICD-10-CM

## 2019-03-18 HISTORY — DX: Personal history of adenomatous and serrated colon polyps: Z86.0101

## 2019-03-18 HISTORY — DX: Personal history of colonic polyps: Z86.010

## 2019-03-18 NOTE — Progress Notes (Signed)
2 adenomas Some mild colitis o/w in remission  My Chart letter'colon recall 1 year

## 2019-03-18 NOTE — Telephone Encounter (Signed)
Left message on f/u call 

## 2019-03-18 NOTE — Telephone Encounter (Signed)
No answer, left message to call back later today, B.Liborio Saccente RN. 

## 2019-03-31 ENCOUNTER — Other Ambulatory Visit: Payer: Self-pay | Admitting: Internal Medicine

## 2019-03-31 MED ORDER — MERCAPTOPURINE 50 MG PO TABS: ORAL_TABLET | ORAL | 0 refills | Status: DC

## 2019-03-31 MED ORDER — HYDROCORTISONE ACE-PRAMOXINE 1-1 % EX CREA: 1.0000 "application " | TOPICAL_CREAM | Freq: Two times a day (BID) | CUTANEOUS | 1 refills | Status: DC | PRN

## 2019-05-01 ENCOUNTER — Other Ambulatory Visit (INDEPENDENT_AMBULATORY_CARE_PROVIDER_SITE_OTHER): Payer: 59

## 2019-05-01 DIAGNOSIS — K5090 Crohn's disease, unspecified, without complications: Secondary | ICD-10-CM | POA: Diagnosis not present

## 2019-05-01 DIAGNOSIS — Z79899 Other long term (current) drug therapy: Secondary | ICD-10-CM

## 2019-05-01 DIAGNOSIS — E782 Mixed hyperlipidemia: Secondary | ICD-10-CM

## 2019-05-01 DIAGNOSIS — Z0000 Encounter for general adult medical examination without abnormal findings: Secondary | ICD-10-CM | POA: Diagnosis not present

## 2019-05-01 DIAGNOSIS — Z7960 Long term (current) use of unspecified immunomodulators and immunosuppressants: Secondary | ICD-10-CM

## 2019-05-01 LAB — CBC WITH DIFFERENTIAL/PLATELET
Basophils Absolute: 0 10*3/uL (ref 0.0–0.1)
Basophils Relative: 0.2 % (ref 0.0–3.0)
Eosinophils Absolute: 0.1 10*3/uL (ref 0.0–0.7)
Eosinophils Relative: 0.7 % (ref 0.0–5.0)
HCT: 44.9 % (ref 39.0–52.0)
Hemoglobin: 14.9 g/dL (ref 13.0–17.0)
Lymphocytes Relative: 22.6 % (ref 12.0–46.0)
Lymphs Abs: 2.1 10*3/uL (ref 0.7–4.0)
MCHC: 33.1 g/dL (ref 30.0–36.0)
MCV: 82.8 fl (ref 78.0–100.0)
Monocytes Absolute: 0.6 10*3/uL (ref 0.1–1.0)
Monocytes Relative: 6.6 % (ref 3.0–12.0)
Neutro Abs: 6.5 10*3/uL (ref 1.4–7.7)
Neutrophils Relative %: 69.9 % (ref 43.0–77.0)
Platelets: 439 10*3/uL — ABNORMAL HIGH (ref 150.0–400.0)
RBC: 5.43 Mil/uL (ref 4.22–5.81)
RDW: 14.7 % (ref 11.5–15.5)
WBC: 9.4 10*3/uL (ref 4.0–10.5)

## 2019-05-01 LAB — HEPATIC FUNCTION PANEL
ALT: 34 U/L (ref 0–53)
AST: 20 U/L (ref 0–37)
Albumin: 4.6 g/dL (ref 3.5–5.2)
Alkaline Phosphatase: 61 U/L (ref 39–117)
Bilirubin, Direct: 0 mg/dL (ref 0.0–0.3)
Total Bilirubin: 0.5 mg/dL (ref 0.2–1.2)
Total Protein: 7.8 g/dL (ref 6.0–8.3)

## 2019-05-01 LAB — LIPID PANEL
Cholesterol: 210 mg/dL — ABNORMAL HIGH (ref 0–200)
HDL: 39.5 mg/dL (ref >39.00)
NonHDL: 170.69
Total CHOL/HDL Ratio: 5
Triglycerides: 245 mg/dL — ABNORMAL HIGH (ref 0.0–149.0)
VLDL: 49 mg/dL — ABNORMAL HIGH (ref 0.0–40.0)

## 2019-05-01 LAB — LDL CHOLESTEROL, DIRECT: Direct LDL: 143 mg/dL

## 2019-05-01 LAB — TSH: TSH: 1.11 u[IU]/mL (ref 0.35–4.50)

## 2019-05-03 ENCOUNTER — Encounter: Payer: Self-pay | Admitting: Family Medicine

## 2019-05-03 NOTE — Progress Notes (Signed)
Michael Moon,  The blood count and liver tests are ok.  Waiting in the metabolites of the 6 mercaptopurine.  I appreciate the opportunity to care for you. Gatha Mayer, MD, [facility]

## 2019-05-07 LAB — THIOPURINE METABOLITES
6 MMP(6-Methylmercaptopurine): 2388 pmol/8x10(8)RBC (ref <5700)
6 TG(6-Thioguanine): 119 pmol/8x10(8)RBC — ABNORMAL LOW (ref 235–400)

## 2019-05-13 ENCOUNTER — Other Ambulatory Visit: Payer: Self-pay | Admitting: Internal Medicine

## 2019-05-13 DIAGNOSIS — Z79899 Other long term (current) drug therapy: Secondary | ICD-10-CM

## 2019-05-13 DIAGNOSIS — Z7960 Long term (current) use of unspecified immunomodulators and immunosuppressants: Secondary | ICD-10-CM

## 2019-05-13 DIAGNOSIS — K5010 Crohn's disease of large intestine without complications: Secondary | ICD-10-CM

## 2019-05-13 NOTE — Progress Notes (Signed)
[place],  The 6TG levels are low - this and the mild inflammation seen on colonoscopy  suggests we should increase the dose of 6 MP  I am sending in a new Rx for it to take 150 mg daily.  In the first week of November we should repeat the labs so put that in your calendar to remember.  Please respond so that I know you have received and understand this message.  Best regards,  Gatha Mayer, MD, [facility]

## 2019-05-26 ENCOUNTER — Encounter: Payer: Self-pay | Admitting: Family Medicine

## 2019-06-10 ENCOUNTER — Telehealth: Payer: Self-pay

## 2019-06-10 ENCOUNTER — Other Ambulatory Visit: Payer: Self-pay | Admitting: Internal Medicine

## 2019-06-10 MED ORDER — MERCAPTOPURINE 50 MG PO TABS: 150.0000 mg | ORAL_TABLET | Freq: Every day | ORAL | 0 refills | Status: DC

## 2019-06-10 NOTE — Telephone Encounter (Signed)
I have changed the expiration dates on the labs to the middle of December.

## 2019-06-10 NOTE — Telephone Encounter (Signed)
-----   Message from Gatha Mayer, MD sent at 06/10/2019 12:29 PM EDT ----- Regarding: change expiration date on orders Please change the expiration date on the pending lab orders he has to middle of December  I don't know how to do that

## 2019-07-01 ENCOUNTER — Other Ambulatory Visit: Payer: Self-pay

## 2019-07-01 ENCOUNTER — Ambulatory Visit: Payer: 59 | Admitting: Family Medicine

## 2019-07-01 ENCOUNTER — Encounter: Payer: Self-pay | Admitting: Family Medicine

## 2019-07-01 VITALS — BP 150/88 | HR 81 | Temp 99.6°F | Resp 16 | Ht 72.0 in | Wt 276.6 lb

## 2019-07-01 DIAGNOSIS — S93401A Sprain of unspecified ligament of right ankle, initial encounter: Secondary | ICD-10-CM | POA: Diagnosis not present

## 2019-07-01 NOTE — Progress Notes (Signed)
OFFICE VISIT  07/01/2019   CC:  Chief Complaint  Patient presents with  . Ankle Pain    right, 3 weeks ago   HPI:    Patient is a 23 y.o. Caucasian male who presents for right ankle pain. On 06/11/19 he slipped on wet grass and right ankle rolled inward, felt a pop.   He was able to bear wt right after and walked with limp.   It was swollen initially and now only swelling when on it for a while. He did RICE and has taken naproxen, ibuprofen.  Hurts with lateral motion, ok with flexion and extension.  Not wearing any ankle support.  Does small amount of home ankle rehab but having some pain with this so he wanted to get it checked out.   No hx of prior R ankle injury.   Past Medical History:  Diagnosis Date  . Anal fissure   . Anemia   . Crohn disease ([place]) dx 2012   Doing well on lialda and mercaptopurine as of 01/2018 (Dr. Carlean Purl). 03/2019 colonoscopy->mild active colitis. Recall 1 yr.  Marland Kitchen GERD (gastroesophageal reflux disease)   . Hx of adenomatous colonic polyps 03/18/2019  . Hyperlipemia, mixed 2019,2020   TLC  . Obesity, Class I, BMI 30-34.9   . Seasonal allergic rhinitis     Past Surgical History:  Procedure Laterality Date  . COLONOSCOPY  2012;03/16/2019   2012 crohns. 03/2019->2 adenomas, mild active colitis, otherwise in remission. Recall 1 yr.  . ESOPHAGOGASTRODUODENOSCOPY  2012  . TONSILLECTOMY AND ADENOIDECTOMY  08/13/2005    Outpatient Medications Prior to Visit  Medication Sig Dispense Refill  . cetirizine (ZYRTEC) 10 MG tablet Take 10 mg by mouth daily.    . mercaptopurine (PURINETHOL) 50 MG tablet Take 3 tablets (150 mg total) by mouth daily. TAKE 2 TABLETS BY MOUTH DAILY. GIVE ON AN EMPTY STOMACH 1 HOUR BEFORE OR 2 HOURS AFTER MEALS. CAUTION: CHEMOTHERAPY. NEED LABS 270 tablet 0  . mesalamine (LIALDA) 1.2 g EC tablet Take 4 tablets (4.8 g total) by mouth daily with breakfast. 360 tablet 3  . omeprazole (PRILOSEC) 40 MG capsule TAKE 1 CAPSULE (40 MG TOTAL) BY  MOUTH DAILY. 90 capsule 3  . pramoxine-hydrocortisone (PROCTOCREAM-HC) 1-1 % rectal cream Place 1 application rectally 2 (two) times daily as needed for hemorrhoids or anal itching. 30 g 1   No facility-administered medications prior to visit.     Allergies  Allergen Reactions  . Codeine Rash    ROS As per HPI  PE: Blood pressure (!) 150/88, pulse 81, temperature 99.6 F (37.6 C), temperature source Temporal, resp. rate 16, height 6' (1.829 m), weight 276 lb 9.6 oz (125.5 kg), SpO2 98 %. Gen: Alert, well appearing.  Patient is oriented to person, place, time, and situation. AFFECT: pleasant, lucid thought and speech. Right ankle w/out swelling, erythema, or warmth. No bony tenderness.  Minimal TTP of lateral ankle around lateral malleolus.  Neg calf squeeze. Achilles normal.  Flexion and extension fully intact and w/out pain. Eversion and inversion full, with mild pain with full inversion.  No laxity with talar tilt compared to left.  Anterior drawer neg.  LABS:    Chemistry      Component Value Date/Time   NA 138 12/31/2018 1153   K 3.9 12/31/2018 1153   CL 101 12/31/2018 1153   CO2 28 12/31/2018 1153   BUN 14 12/31/2018 1153   CREATININE 0.90 12/31/2018 1153      Component Value Date/Time  CALCIUM 9.3 12/31/2018 1153   ALKPHOS 61 05/01/2019 1312   AST 20 05/01/2019 1312   ALT 34 05/01/2019 1312   BILITOT 0.5 05/01/2019 1312      IMPRESSION AND PLAN:  Ankle sprain, slow to completely resolve. He feels lateral instability and pain, but exam is reassuring. No x-ray indicated. Lace up ankle support dispensed/fitted, discussed wearing this for most activity for the next 4-5d, discussed home ankle rehab exercises, theraband use, printed out rehab exercises for him to take home. May use NSAID prn pain.  An After Visit Summary was printed and given to the patient.  FOLLOW UP: Return if symptoms worsen or fail to improve.  Signed:  Crissie Sickles, MD            07/01/2019

## 2019-07-01 NOTE — Patient Instructions (Signed)
Ankle Sprain, Phase II Rehab An ankle sprain is an injury to tissue that connects bone to bone (a ligament) in the ankle. Ankle sprains usually cause stiffness, loss of motion, and loss of strength. Ask your health care provider which exercises are safe for you. Do exercises exactly as told by your health care provider and adjust them as directed. It is normal to feel mild stretching, pulling, tightness, or discomfort as you do these exercises. Stop right away if you feel sudden pain or your pain gets worse. Do not begin these exercises until told by your health care provider. Stretching and range-of-motion exercises These exercises warm up your muscles and joints and improve the movement and flexibility of your lower leg and ankle. These exercises also help to relieve pain and stiffness. Standing gastroc stretch This exercise is also called a standing calf (gastroc) stretch. 1. Stand with your hands against a wall. 2. Extend your left / right leg behind you, and bend your front knee slightly. Your heels should be on the floor. 3. Keeping your heels on the floor and your back knee straight, shift your weight toward the wall. You should feel a gentle stretch in the back of your lower leg (calf). 4. Hold this position for __________ seconds. Repeat __________ times. Complete this exercise __________ times a day. Standing soleus stretch This exercise is also called a standing calf (soleus) stretch. 1. Stand with your hands against a wall. 2. Extend your left / right leg behind you, and bend your front knee slightly. Both of your heels should be on the floor. 3. Keeping your heels on the floor, bend your back knee and shift your weight slightly over your back leg. You should feel a gentle stretch deep in your calf. 4. Hold this position for __________ seconds. Repeat __________ times. Complete this exercise __________ times a day. Strengthening exercises These exercises build strength and endurance in  your lower leg. Endurance is the ability to use your muscles for a long time, even after they get tired. Heel walking  This exercise is sometimes called dorsiflexion. 1. Walk on your heels for __________ seconds or ___________ ft. Keep your toes as high as possible. Repeat __________ times. Complete this exercise __________ times a day. Balance exercises These exercises improve your balance and the reaction and control of your ankle to help improve stability. Multi-angle lunge 1. Stand with your feet together. 2. Take a step forward with your left / right leg, and shift your weight onto that leg. Your back heel will come off the floor, and your back toes will stay in place. 3. Push off your front leg to return your front foot to the starting position next to your other foot. 4. Repeat to the side, to the back, and any other directions as told by your health care provider. Repeat __________ times. Complete this exercise __________ times a day. Single leg stand If this exercise is too easy, you can try it with your eyes closed or while standing on a pillow. 1. Without shoes, stand near a railing or in a door frame. Hold on to the railing or door frame as needed. Let loose of the railing or door frame as you are able. 2. Stand on your left / right foot. Keep your big toe down on the floor and try to keep your arch lifted. 3. Hold this position for __________ seconds. Repeat __________ times. Complete this exercise __________ times a day. Ankle inversion and eversion This exercise is  also called foot rotation with a balance board. This exercise uses a balance board to rotate the foot and ankle inward (inversion) and outward (eversion). Ask your health care provider where you can get a balance board or how you can make one. 1. Stand on a non-carpeted surface near a countertop or wall. 2. Step onto the balance board so your feet are hip width apart. 3. Keep your feet in place and keep your upper  body and hips steady. 4. Using only your feet and ankles to move the board, do the following exercises as told by your health care provider: ? Tip the board side to side as far as you can, alternating between tipping to the left and tipping to the right. ? Tip the board so it silently taps the floor. Do not let the board forcefully hit the floor. ? From time to time, pause to hold a steady midway position, with neither the right nor the left sides touching the ground. ? Tip the board side to side so the board does not hit the floor at all. From time to time, pause to hold a steady midway position. Repeat __________ times. Complete this exercise __________ times a day. Ankle plantar flexion and dorsiflexion This exercise is also called foot flexion with a balance board. This exercise uses a balance board to push the foot downward and away from the leg (plantar flexion) or upward and toward the leg (dorsiflexion). Ask your health care provider where you can get a balance board or how you can make one. 1. Stand on a non-carpeted surface near a countertop or wall. 2. Step onto the balance board so your feet are hip width apart. 3. Keep your feet in place and keep your upper body and hips steady. 4. Using only your feet and ankles to move the board, do one or both of the following exercises as told by your health care provider: ? Tip the board forward and backward so the board silently taps the floor. Do not let the board forcefully hit the floor. ? From time to time, pause to hold a steady position midway between touching the floor in front and touching the floor in back. ? Tip the board forward and backward so the board does not hit the floor at all. From time to time, pause to hold a steady position in the middle. Repeat __________ times. Complete this exercise __________ times a day. This information is not intended to replace advice given to you by your health care provider. Make sure you discuss  any questions you have with your health care provider. Document Released: 11/19/2005 Document Revised: 11/18/2018 Document Reviewed: 05/12/2018 Elsevier Patient Education  [employer]. Ankle Sprain, Phase I Rehab An ankle sprain is an injury to the ligaments of your ankle. Ankle sprains cause stiffness, loss of motion, and loss of strength. Ask your health care provider which exercises are safe for you. Do exercises exactly as told by your health care provider and adjust them as directed. It is normal to feel mild stretching, pulling, tightness, or discomfort as you do these exercises. Stop right away if you feel sudden pain or your pain gets worse. Do not begin these exercises until told by your health care provider. Stretching and range-of-motion exercises These exercises warm up your muscles and joints and improve the movement and flexibility of your lower leg and ankle. These exercises also help to relieve pain and stiffness. Gastroc and soleus stretch This exercise is also called  a calf stretch. It stretches the muscles in the back of the lower leg. These muscles are the gastrocnemius, or gastroc, and the soleus. 1. Sit on the floor with your left / right leg extended. 2. Loop a belt or towel around the ball of your left / right foot. The ball of your foot is on the walking surface, right under your toes. 3. Keep your left / right ankle and foot relaxed and keep your knee straight while you use the belt or towel to pull your foot toward you. You should feel a gentle stretch behind your calf or knee in your gastroc muscle. 4. Hold this position for __________ seconds, then release to the starting position. 5. Repeat the exercise with your knee bent. You can put a pillow or a rolled bath towel under your knee to support it. You should feel a stretch deep in your calf in the soleus muscle or at your Achilles tendon. Repeat __________ times. Complete this exercise __________ times a day. Ankle  alphabet  1. Sit with your left / right leg supported at the lower leg. ? Do not rest your foot on anything. ? Make sure your foot has room to move freely. 2. Think of your left / right foot as a paintbrush. ? Move your foot to trace each letter of the alphabet in the air. Keep your hip and knee still while you trace. ? Make the letters as large as you can without feeling discomfort. 3. Trace every letter from A to Z. Repeat __________ times. Complete this exercise __________ times a day. Strengthening exercises These exercises build strength and endurance in your ankle and lower leg. Endurance is the ability to use your muscles for a long time, even after they get tired. Ankle dorsiflexion  1. Secure a rubber exercise band or tube to an object, such as a table leg, that will stay still when the band is pulled. Secure the other end around your left / right foot. 2. Sit on the floor facing the object, with your left / right leg extended. The band or tube should be slightly tense when your foot is relaxed. 3. Slowly bring your foot toward you, bringing the top of your foot toward your shin (dorsiflexion), and pulling the band tighter. 4. Hold this position for __________ seconds. 5. Slowly return your foot to the starting position. Repeat __________ times. Complete this exercise __________ times a day. Ankle plantar flexion  1. Sit on the floor with your left / right leg extended. 2. Loop a rubber exercise tube or band around the ball of your left / right foot. The ball of your foot is on the walking surface, right under your toes. ? Hold the ends of the band or tube in your hands. ? The band or tube should be slightly tense when your foot is relaxed. 3. Slowly point your foot and toes downward to tilt the top of your foot away from your shin (plantar flexion). 4. Hold this position for __________ seconds. 5. Slowly return your foot to the starting position. Repeat __________ times.  Complete this exercise __________ times a day. Ankle eversion 1. Sit on the floor with your legs straight out in front of you. 2. Loop a rubber exercise band or tube around the ball of your left / right foot. The ball of your foot is on the walking surface, right under your toes. ? Hold the ends of the band in your hands, or secure the band to  a stable object. ? The band or tube should be slightly tense when your foot is relaxed. 3. Slowly push your foot outward, away from your other leg (eversion). 4. Hold this position for __________ seconds. 5. Slowly return your foot to the starting position. Repeat __________ times. Complete this exercise __________ times a day. This information is not intended to replace advice given to you by your health care provider. Make sure you discuss any questions you have with your health care provider. Document Released: 02/28/2005 Document Revised: 11/18/2018 Document Reviewed: 05/12/2018 Elsevier Patient Education  2020 [employer].

## 2019-07-15 ENCOUNTER — Other Ambulatory Visit (INDEPENDENT_AMBULATORY_CARE_PROVIDER_SITE_OTHER): Payer: 59

## 2019-07-15 DIAGNOSIS — K5010 Crohn's disease of large intestine without complications: Secondary | ICD-10-CM | POA: Diagnosis not present

## 2019-07-15 DIAGNOSIS — Z79899 Other long term (current) drug therapy: Secondary | ICD-10-CM | POA: Diagnosis not present

## 2019-07-15 DIAGNOSIS — Z7960 Long term (current) use of unspecified immunomodulators and immunosuppressants: Secondary | ICD-10-CM

## 2019-07-15 LAB — CBC WITH DIFFERENTIAL/PLATELET
Basophils Absolute: 0.1 10*3/uL (ref 0.0–0.1)
Basophils Relative: 1.2 % (ref 0.0–3.0)
Eosinophils Absolute: 0.1 10*3/uL (ref 0.0–0.7)
Eosinophils Relative: 0.9 % (ref 0.0–5.0)
HCT: 43.8 % (ref 39.0–52.0)
Hemoglobin: 14.7 g/dL (ref 13.0–17.0)
Lymphocytes Relative: 22.8 % (ref 12.0–46.0)
Lymphs Abs: 1.6 10*3/uL (ref 0.7–4.0)
MCHC: 33.6 g/dL (ref 30.0–36.0)
MCV: 84.9 fl (ref 78.0–100.0)
Monocytes Absolute: 0.5 10*3/uL (ref 0.1–1.0)
Monocytes Relative: 6.8 % (ref 3.0–12.0)
Neutro Abs: 4.9 10*3/uL (ref 1.4–7.7)
Neutrophils Relative %: 68.3 % (ref 43.0–77.0)
Platelets: 430 10*3/uL — ABNORMAL HIGH (ref 150.0–400.0)
RBC: 5.16 Mil/uL (ref 4.22–5.81)
RDW: 15.6 % — ABNORMAL HIGH (ref 11.5–15.5)
WBC: 7.2 10*3/uL (ref 4.0–10.5)

## 2019-07-15 LAB — HEPATIC FUNCTION PANEL
ALT: 68 U/L — ABNORMAL HIGH (ref 0–53)
AST: 27 U/L (ref 0–37)
Albumin: 4.3 g/dL (ref 3.5–5.2)
Alkaline Phosphatase: 54 U/L (ref 39–117)
Bilirubin, Direct: 0.1 mg/dL (ref 0.0–0.3)
Total Bilirubin: 0.5 mg/dL (ref 0.2–1.2)
Total Protein: 7.2 g/dL (ref 6.0–8.3)

## 2019-07-15 NOTE — Progress Notes (Signed)
Slightly high ALT - not likely  a problem Waiting on thiopurine metabolities and more to come after those are resulted

## 2019-07-22 LAB — THIOPURINE METABOLITES
6 MMP(6-Methylmercaptopurine): 11127 pmol/8x10(8)RBC — ABNORMAL HIGH (ref <5700)
6 TG(6-Thioguanine): 88 pmol/8x10(8)RBC — ABNORMAL LOW (ref 235–400)

## 2019-08-13 ENCOUNTER — Other Ambulatory Visit: Payer: Self-pay | Admitting: Internal Medicine

## 2019-08-13 DIAGNOSIS — Z79899 Other long term (current) drug therapy: Secondary | ICD-10-CM

## 2019-08-13 DIAGNOSIS — Z7960 Long term (current) use of unspecified immunomodulators and immunosuppressants: Secondary | ICD-10-CM

## 2019-08-13 DIAGNOSIS — K5150 Left sided colitis without complications: Secondary | ICD-10-CM

## 2019-08-18 ENCOUNTER — Other Ambulatory Visit (INDEPENDENT_AMBULATORY_CARE_PROVIDER_SITE_OTHER): Payer: 59

## 2019-08-18 ENCOUNTER — Telehealth: Payer: Self-pay

## 2019-08-18 DIAGNOSIS — Z79899 Other long term (current) drug therapy: Secondary | ICD-10-CM

## 2019-08-18 DIAGNOSIS — K5150 Left sided colitis without complications: Secondary | ICD-10-CM

## 2019-08-18 DIAGNOSIS — Z7960 Long term (current) use of unspecified immunomodulators and immunosuppressants: Secondary | ICD-10-CM

## 2019-08-18 LAB — CBC WITH DIFFERENTIAL/PLATELET
Basophils Absolute: 0 K/uL (ref 0.0–0.1)
Basophils Relative: 0.4 % (ref 0.0–3.0)
Eosinophils Absolute: 0.1 K/uL (ref 0.0–0.7)
Eosinophils Relative: 0.6 % (ref 0.0–5.0)
HCT: 44.5 % (ref 39.0–52.0)
Hemoglobin: 14.8 g/dL (ref 13.0–17.0)
Lymphocytes Relative: 25 % (ref 12.0–46.0)
Lymphs Abs: 2 K/uL (ref 0.7–4.0)
MCHC: 33.2 g/dL (ref 30.0–36.0)
MCV: 83.7 fl (ref 78.0–100.0)
Monocytes Absolute: 0.6 K/uL (ref 0.1–1.0)
Monocytes Relative: 7.3 % (ref 3.0–12.0)
Neutro Abs: 5.4 K/uL (ref 1.4–7.7)
Neutrophils Relative %: 66.7 % (ref 43.0–77.0)
Platelets: 407 K/uL — ABNORMAL HIGH (ref 150.0–400.0)
RBC: 5.32 Mil/uL (ref 4.22–5.81)
RDW: 15.2 % (ref 11.5–15.5)
WBC: 8.1 K/uL (ref 4.0–10.5)

## 2019-08-18 LAB — COMPREHENSIVE METABOLIC PANEL WITH GFR
ALT: 51 U/L (ref 0–53)
AST: 24 U/L (ref 0–37)
Albumin: 4.3 g/dL (ref 3.5–5.2)
Alkaline Phosphatase: 56 U/L (ref 39–117)
BUN: 10 mg/dL (ref 6–23)
CO2: 25 meq/L (ref 19–32)
Calcium: 9.4 mg/dL (ref 8.4–10.5)
Chloride: 104 meq/L (ref 96–112)
Creatinine, Ser: 0.9 mg/dL (ref 0.40–1.50)
GFR: 102.2 mL/min
Glucose, Bld: 94 mg/dL (ref 70–99)
Potassium: 3.8 meq/L (ref 3.5–5.1)
Sodium: 139 meq/L (ref 135–145)
Total Bilirubin: 0.4 mg/dL (ref 0.2–1.2)
Total Protein: 7.3 g/dL (ref 6.0–8.3)

## 2019-08-18 LAB — C-REACTIVE PROTEIN: CRP: 1.1 mg/dL (ref 0.5–20.0)

## 2019-08-18 NOTE — Telephone Encounter (Signed)
Michael Moon did repeat labs today Sir and came upstairs to let you know that when he did the first round of labs he was on Advil for a sprained ankle in case that is what was elevating the numbers. He took the Advil for 2 weeks. The only symptom he is having now is that he is more tired than normal.  He said he had not heard back from his [facility] message so he wasn't sure you had gotten it.

## 2019-08-25 NOTE — Telephone Encounter (Signed)
Dr Carlean Purl has answered him back today with his lab results in a [facility] message.

## 2019-09-17 ENCOUNTER — Other Ambulatory Visit (INDEPENDENT_AMBULATORY_CARE_PROVIDER_SITE_OTHER): Payer: 59

## 2019-09-17 ENCOUNTER — Other Ambulatory Visit: Payer: Self-pay

## 2019-09-17 ENCOUNTER — Ambulatory Visit (INDEPENDENT_AMBULATORY_CARE_PROVIDER_SITE_OTHER): Payer: 59 | Admitting: Internal Medicine

## 2019-09-17 ENCOUNTER — Encounter: Payer: Self-pay | Admitting: Internal Medicine

## 2019-09-17 VITALS — BP 130/90 | HR 88 | Ht 72.0 in | Wt 280.0 lb

## 2019-09-17 DIAGNOSIS — R142 Eructation: Secondary | ICD-10-CM

## 2019-09-17 DIAGNOSIS — Z6837 Body mass index (BMI) 37.0-37.9, adult: Secondary | ICD-10-CM

## 2019-09-17 DIAGNOSIS — Z79899 Other long term (current) drug therapy: Secondary | ICD-10-CM

## 2019-09-17 DIAGNOSIS — E669 Obesity, unspecified: Secondary | ICD-10-CM

## 2019-09-17 DIAGNOSIS — Z7960 Long term (current) use of unspecified immunomodulators and immunosuppressants: Secondary | ICD-10-CM

## 2019-09-17 DIAGNOSIS — K5010 Crohn's disease of large intestine without complications: Secondary | ICD-10-CM

## 2019-09-17 LAB — HEPATIC FUNCTION PANEL
ALT: 37 U/L (ref 0–53)
AST: 17 U/L (ref 0–37)
Albumin: 4.6 g/dL (ref 3.5–5.2)
Alkaline Phosphatase: 62 U/L (ref 39–117)
Bilirubin, Direct: 0.1 mg/dL (ref 0.0–0.3)
Total Bilirubin: 0.3 mg/dL (ref 0.2–1.2)
Total Protein: 7.9 g/dL (ref 6.0–8.3)

## 2019-09-17 LAB — CBC WITH DIFFERENTIAL/PLATELET
Basophils Absolute: 0.1 10*3/uL (ref 0.0–0.1)
Basophils Relative: 0.6 % (ref 0.0–3.0)
Eosinophils Absolute: 0.1 10*3/uL (ref 0.0–0.7)
Eosinophils Relative: 0.7 % (ref 0.0–5.0)
HCT: 45 % (ref 39.0–52.0)
Hemoglobin: 15.1 g/dL (ref 13.0–17.0)
Lymphocytes Relative: 21.8 % (ref 12.0–46.0)
Lymphs Abs: 1.8 10*3/uL (ref 0.7–4.0)
MCHC: 33.5 g/dL (ref 30.0–36.0)
MCV: 83.4 fl (ref 78.0–100.0)
Monocytes Absolute: 0.5 10*3/uL (ref 0.1–1.0)
Monocytes Relative: 5.9 % (ref 3.0–12.0)
Neutro Abs: 5.9 10*3/uL (ref 1.4–7.7)
Neutrophils Relative %: 71 % (ref 43.0–77.0)
Platelets: 414 10*3/uL — ABNORMAL HIGH (ref 150.0–400.0)
RBC: 5.4 Mil/uL (ref 4.22–5.81)
RDW: 14.7 % (ref 11.5–15.5)
WBC: 8.3 10*3/uL (ref 4.0–10.5)

## 2019-09-17 NOTE — Progress Notes (Signed)
Michael Moon 24 y.o. 05-21-1996 [id number]  Assessment & Plan:   Encounter Diagnoses  Name Primary?  . Crohn's disease of colon without complication ([place]) Yes  . Long-term use of immunosuppressant medication - 6 mercaptopurine   . Belching   . Class 2 obesity with body mass index (BMI) of 37.0 to 37.9 in adult, unspecified obesity type, unspecified whether serious comorbidity present     Crohn's disease of colon ([place]) Recent events with patchy mild inflammation on endoscopic view and lower levels of 6-TG led me to increase his dose of 6-MP from 100 to 150 mg but that drove his 6-TG level lower and 6 MMP levels higher and some abnormal transaminases so he is back at 100 mg and clinically okay.  Remains on his Lialda.  Rechecking thio purine metabolites today.  Hard to make a case for going to [employer]olog[employer] in this man as well as he is doing at this point.  I remain puzzled by the response to increased dose.  Typically being on Lialda would drive 6-TG levels higher and not necessarily 6 MMP levels higher than I am aware of.  We will see what his new labs show [place].  Anticipate a colonoscopy late summer 2021 because of Crohn's colitis and his adenomatous polyps.  Long-term use of immunosuppressant medication - 6 mercaptopurine See my assessment under Crohn's colitis  Class 2 obesity with body mass index (BMI) of 37.0 to 37.9 in adult He has a 7 pound weight gain since August, this may be driving his belching.  We reviewed diet to reduce gas-forming foods and I have given him information from the [employer] and diet https://moore.biz/ websites to look at as I think a low-carb diet is in his best interest.  He has a large frame so his BMI may not be as accurate but it is in his best interest to lose weight.  I have also given him a handout regarding healthy oils to cope with.  I appreciate the opportunity to care for this patient.  CC: McGowen, Adrian Blackwater, MD   Subjective:   Chief Complaint:  Follow-up of Crohn's disease, Crohn's colitis with recent abnormal LFTs and metabolites, belching  HPI [name] is here for follow-up of his Crohn's colitis, he had had some mild inflammation and pseudopolyps and also 2 diminutive adenomas on a colonoscopy in August 2020.  I increased his 6-MP dosing and then his LFTs went up, and he had a puzzling change with increase in thio purine metabolites as well.  Initially I checked his 6-TG levels after his colonoscopy and they were low at 118 and his 6 MMP levels were normal.  I increased his 6-MP to 150 mg daily.  LFTs were normal then as well.  Subsequently on follow-up labs 3 months later (December) his ALT was elevated at 68 his 6-TG was lower at 88 and his 6 MMP levels were high at 11,000 127.  I was puzzled by this, rechecked LFTs in early January and they were normal as was a C-reactive protein.  CBC has been normal.  After his December lab results I had reduced his 6-MP back to 100 mg daily.  He remains on mesalamine as he has for some time.  So the January normal LFTs and CBC were taken on 100 mg 6-MP.  He feels about the same, with respect to his colitis stools are generally formed there is no pain there is no bleeding.  His new complaint today is over the past  several months he has been belching more.  Pepcid does not seem to help that.  Its not really associated with heartburn or regurgitation of fluid.  Diet is unchanged.  He is working weekend shift and eats in the hospital cafeteria at [place] when he does that working Friday Saturday Sunday half  of the time he does not meal prep but may take his own food about the other half of the time..  Weight as below.  Wt Readings from Last 3 Encounters:  09/17/19 280 lb (127 kg)  07/01/19 276 lb 9.6 oz (125.5 kg)  03/16/19 273 lb (123.8 kg)     Allergies  Allergen Reactions  . Codeine Rash   Current Meds  Medication Sig  . cetirizine (ZYRTEC) 10 MG tablet Take 10 mg by mouth daily.  .  mercaptopurine (PURINETHOL) 50 MG tablet Take 3 tablets (150 mg total) by mouth daily. TAKE 2 TABLETS BY MOUTH DAILY. GIVE ON AN EMPTY STOMACH 1 HOUR BEFORE OR 2 HOURS AFTER MEALS. CAUTION: CHEMOTHERAPY. NEED LABS  . mesalamine (LIALDA) 1.2 g EC tablet Take 4 tablets (4.8 g total) by mouth daily with breakfast.  . omeprazole (PRILOSEC) 40 MG capsule TAKE 1 CAPSULE (40 MG TOTAL) BY MOUTH DAILY.  . pramoxine-hydrocortisone (PROCTOCREAM-HC) 1-1 % rectal cream Place 1 application rectally 2 (two) times daily as needed for hemorrhoids or anal itching.   Past Medical History:  Diagnosis Date  . Anal fissure   . Anemia   . Crohn disease ([place]) dx 2012   Doing well on lialda and mercaptopurine as of 01/2018 (Dr. Carlean Purl). 03/2019 colonoscopy->mild active colitis. Recall 1 yr.  Marland Kitchen GERD (gastroesophageal reflux disease)   . Hx of adenomatous colonic polyps 03/18/2019  . Hyperlipemia, mixed 2019,2020   TLC  . Obesity, Class I, BMI 30-34.9   . Seasonal allergic rhinitis    Past Surgical History:  Procedure Laterality Date  . COLONOSCOPY  2012;03/16/2019   2012 crohns. 03/2019->2 adenomas, mild active colitis, otherwise in remission. Recall 1 yr.  . ESOPHAGOGASTRODUODENOSCOPY  2012  . TONSILLECTOMY AND ADENOIDECTOMY  08/13/2005   Social History   Social History Narrative   Single, no children.   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   No T/A/Ds.   Lives with mom in [place]   family history includes Alcohol abuse in his maternal grandfather, paternal grandfather, and paternal grandmother; Arthritis in his maternal grandmother and mother; Brain cancer in his father; Diabetes in his maternal grandmother; Early death in his paternal grandfather; Hearing loss in his maternal grandfather; Hyperlipidemia in his father; Hypertension in his father, maternal grandmother, mother, and paternal grandmother; Stroke in his maternal grandfather and maternal  grandmother.   Review of Systems As above  Objective:   Physical Exam BP 130/90   Pulse 88   Ht 6' (1.829 m)   Wt 280 lb (127 kg)   BMI 37.97 kg/m

## 2019-09-17 NOTE — Patient Instructions (Signed)
Justan, we will recheck the thiopurine metabolites, CBC and LFT's today.  Here is the nutrition info we discussed. There is a paper on the oils and fats that era good (and not).  Healthy and nutritious eating and weight loss are made to be harder than they need to be. A simplified diet approach based around eating normally, as much as you want without restricting intake too much is better, I think. You must avoid packaged foods and try to eat real foods as much as possible. Packaged foods, sugary sodas, highly processed foods taste great but are slow poisons that lead to obesity and/or poor health.  It is very helpful to take some time each week and plan your meals. Work with your spouse, partner, family to do this as much as possible. Preparing meals ahead to take to work or school is especially helpful and will save money, too. You can do this and by working together it can take less time.  Another way to help is to order food on-line for pick-up (or delivery if you can afford) and you will avoid impulse buys of unhealthy foods.  Some resources that I like are:  www.gaplesinstitute.org (Do the learning modules about healthy eating)  www.dietdoctor.com - helps with low carb diets and also can learn about and consider intermittent  fasting.  Best to change what you eat before doing this.    I appreciate the opportunity to care for you. Gatha Mayer, MD, [facility]

## 2019-09-20 NOTE — Assessment & Plan Note (Signed)
See my assessment under Crohn's colitis

## 2019-09-20 NOTE — Assessment & Plan Note (Addendum)
Recent events with patchy mild inflammation on endoscopic view and lower levels of 6-TG led me to increase his dose of 6-MP from 100 to 150 mg but that drove his 6-TG level lower and 6 MMP levels higher and some abnormal transaminases so he is back at 100 mg and clinically okay.  Remains on his Lialda.  Rechecking thio purine metabolites today.  Hard to make a case for going to Biologics in this man as well as he is doing at this point.  I remain puzzled by the response to increased dose.  Typically being on Lialda would drive 6-TG levels higher and not necessarily 6 MMP levels higher than I am aware of.  We will see what his new labs show [place].  Anticipate a colonoscopy late summer 2021 because of Crohn's colitis and his adenomatous polyps.

## 2019-09-20 NOTE — Assessment & Plan Note (Signed)
He has a 7 pound weight gain since August, this may be driving his belching.  We reviewed diet to reduce gas-forming foods and I have given him information from the [employer] and diet https://moore.biz/ websites to look at as I think a low-carb diet is in his best interest.  He has a large frame so his BMI may not be as accurate but it is in his best interest to lose weight.  I have also given him a handout regarding healthy oils to cope with.

## 2019-09-23 LAB — THIOPURINE METABOLITES
6 MMP(6-Methylmercaptopurine): 2818 pmol/8x10(8)RBC (ref <5700)
6 TG(6-Thioguanine): 167 pmol/8x10(8)RBC — ABNORMAL LOW (ref 235–400)

## 2019-10-19 ENCOUNTER — Other Ambulatory Visit: Payer: Self-pay | Admitting: Internal Medicine

## 2020-01-15 ENCOUNTER — Encounter: Payer: Self-pay | Admitting: Family Medicine

## 2020-01-16 ENCOUNTER — Telehealth: Payer: Self-pay

## 2020-01-16 NOTE — Telephone Encounter (Signed)
Patient sent mychart message asking to be seen next week for possible ganglion cyst and referral if needed. Only time he is available to come in would be 4pm Thursday.  Okay to schedule?

## 2020-01-17 NOTE — Telephone Encounter (Signed)
Yes but has to be in-person

## 2020-01-18 NOTE — Telephone Encounter (Signed)
Mychart message sent to see if appt time still available for him.

## 2020-01-18 NOTE — Telephone Encounter (Signed)
LM for patient to return call to see if 4pm will work for Thursday.

## 2020-01-18 NOTE — Telephone Encounter (Signed)
Patient sent mychart message stating [facility].

## 2020-01-20 ENCOUNTER — Ambulatory Visit: Payer: Self-pay

## 2020-01-20 ENCOUNTER — Ambulatory Visit: Payer: 59 | Admitting: Orthopedic Surgery

## 2020-01-20 ENCOUNTER — Other Ambulatory Visit: Payer: Self-pay

## 2020-01-20 DIAGNOSIS — M25532 Pain in left wrist: Secondary | ICD-10-CM

## 2020-01-20 DIAGNOSIS — M67432 Ganglion, left wrist: Secondary | ICD-10-CM

## 2020-01-29 NOTE — Progress Notes (Signed)
Office Visit Note   Patient: Michael Moon           Date of Birth: [birth year]           MRN: [id number] Visit Date: 01/20/2020 Requested by: Tammi Sou, MD [address] [place],  [place] [address] PCP: Tammi Sou, MD  Subjective: Chief Complaint  Patient presents with  . Left Wrist - Pain    HPI: Michael Moon is a 24 y.o. male who presents to the office complaining of left wrist pain.  Patient notes several months of left wrist pain with increased swelling to the left wrist.  He denies any history of injury.  He rates pain 8 out of 10 at times.  Pain is worse with wrist flexion and extension.  No change in pain with pronation/supination.  Denies any elbow pain.  Denies any numbness or tingling.  He has noticed a mobile mass in his wrist that has been swelling in size over the last several weeks..                ROS:  All systems reviewed are negative as they relate to the chief complaint within the history of present illness.  Patient denies fevers or chills.  Assessment & Plan: Visit Diagnoses:  1. Ganglion cyst of dorsum of left wrist   2. Pain in left wrist     Plan: Patient is a 24 year old male who presents complaining of worsening left wrist pain.  He has a ganglion cyst of the left wrist dorsally.  Ganglion cyst was identified on ultrasound and found to be quite small.  With patient's 8 out of 10 pain as well as increase in symptoms recently, ordered MRI arthrogram of the left wrist for further evaluation.  The severity of his pain does not seem to align with the smallest size of the ganglion cyst.  Follow-up after MRI to review results.  Radiographs negative for any acute pathology.  Follow-Up Instructions: No follow-ups on file.   Orders:  Orders Placed This Encounter  Procedures  . XR Wrist 2 Views Left  . MR Wrist Left w/ contrast  . Arthrogram   No orders of the defined types were placed in this encounter.     Procedures: Medium Joint Inj:  L radiocarpal on 01/30/2020 2:05 PM Indications: pain and diagnostic evaluation Details: 22 G 1.5 in needle, fluoroscopy-guided dorsal approach Medications: 3 mL lidocaine 1 %; 13.33 mg methylPREDNISolone acetate 40 MG/ML; 0.66 mL bupivacaine 0.25 % Outcome: tolerated well, no immediate complications Procedure, treatment alternatives, risks and benefits explained, specific risks discussed. Consent was given by the patient. Immediately prior to procedure a time out was called to verify the correct patient, procedure, equipment, support staff and site/side marked as required. Patient was prepped and draped in the usual sterile fashion.       Clinical Data: No additional findings.  Objective: Vital Signs: There were no vitals taken for this visit.  Physical Exam:  Constitutional: Patient appears well-developed HEENT:  Head: Normocephalic Eyes:EOM are normal Neck: Normal range of motion Cardiovascular: Normal rate Pulmonary/chest: Effort normal Neurologic: Patient is alert Skin: Skin is warm Psychiatric: Patient has normal mood and affect  Ortho Exam:  Left wrist exam Tenderness to palpation dorsally with a small mobile cyst palpable.  Swelling in the dorsal wrist.  No tenderness to palpation in the anatomic snuffbox, ulnar fovea, CMC base, scaphoid tubercle, radial styloid, hand, fingers.  Pain worse with extension/flexion.  No pain or loss of range of motion with pronation/supination.  Specialty Comments:  No specialty comments available.  Imaging: No results found.   PMFS History: Patient Active Problem List   Diagnosis Date Noted  . Hx of adenomatous colonic polyps 03/18/2019  . Class 2 obesity with body mass index (BMI) of 37.0 to 37.9 in adult 02/25/2019  . Long-term use of immunosuppressant medication - 6 mercaptopurine 01/23/2019  . Crohn's disease of colon ([place]) 11/01/2017  . Iron deficiency anemia 01/03/2016  . GERD (gastroesophageal reflux disease) 03/13/2012  .  Allergic rhinitis 08/15/2007   Past Medical History:  Diagnosis Date  . Anal fissure   . Anemia   . Crohn disease ([employer]) dx 2012   Doing well on lialda and mercaptopurine as of 01/2018 (Dr. Carlean Purl). 03/2019 colonoscopy->mild active colitis. Recall 1 yr.  Marland Kitchen GERD (gastroesophageal reflux disease)   . Hx of adenomatous colonic polyps 03/18/2019  . Hyperlipemia, mixed 2019,2020   TLC  . Obesity, Class I, BMI 30-34.9   . Seasonal allergic rhinitis     Family History  Problem Relation Age of Onset  . Arthritis Mother   . Hypertension Mother   . Brain cancer Father   . Hyperlipidemia Father   . Hypertension Father   . Arthritis Maternal Grandmother   . Diabetes Maternal Grandmother   . Hypertension Maternal Grandmother   . Stroke Maternal Grandmother   . Stroke Maternal Grandfather   . Hearing loss Maternal Grandfather   . Alcohol abuse Maternal Grandfather   . Alcohol abuse Paternal Grandmother   . Hypertension Paternal Grandmother   . Alcohol abuse Paternal Grandfather   . Early death Paternal Grandfather        unknown cause  . Rectal cancer Neg Hx   . Colon cancer Neg Hx     Past Surgical History:  Procedure Laterality Date  . COLONOSCOPY  2012;03/16/2019   2012 crohns. 03/2019->2 adenomas, mild active colitis, otherwise in remission. Recall 1 yr.  . ESOPHAGOGASTRODUODENOSCOPY  2012  . TONSILLECTOMY AND ADENOIDECTOMY  08/13/2005   Social History   Occupational History  . Occupation: MRI Psychologist, clinical: [employer]  Tobacco Use  . Smoking status: Never Smoker  . Smokeless tobacco: Never Used  Vaping Use  . Vaping Use: Never used  Substance and Sexual Activity  . Alcohol use: No  . Drug use: No  . Sexual activity: Not on file

## 2020-01-30 ENCOUNTER — Encounter: Payer: Self-pay | Admitting: Orthopedic Surgery

## 2020-01-30 DIAGNOSIS — M25532 Pain in left wrist: Secondary | ICD-10-CM | POA: Diagnosis not present

## 2020-01-30 DIAGNOSIS — M67432 Ganglion, left wrist: Secondary | ICD-10-CM

## 2020-01-30 MED ORDER — BUPIVACAINE HCL 0.25 % IJ SOLN
0.6600 mL | INTRAMUSCULAR | Status: AC | PRN
  Administered 2020-01-30: .66 mL via INTRA_ARTICULAR

## 2020-01-30 MED ORDER — METHYLPREDNISOLONE ACETATE 40 MG/ML IJ SUSP
13.3300 mg | INTRAMUSCULAR | Status: AC | PRN
  Administered 2020-01-30: 13.33 mg via INTRA_ARTICULAR

## 2020-01-30 MED ORDER — LIDOCAINE HCL 1 % IJ SOLN
3.0000 mL | INTRAMUSCULAR | Status: AC | PRN
  Administered 2020-01-30: 3 mL

## 2020-02-01 ENCOUNTER — Encounter: Payer: Self-pay | Admitting: Family Medicine

## 2020-02-01 ENCOUNTER — Other Ambulatory Visit: Payer: Self-pay

## 2020-02-01 ENCOUNTER — Ambulatory Visit (INDEPENDENT_AMBULATORY_CARE_PROVIDER_SITE_OTHER): Payer: 59 | Admitting: Family Medicine

## 2020-02-01 VITALS — BP 136/84 | HR 79 | Temp 98.9°F | Resp 18 | Ht 72.75 in | Wt 275.5 lb

## 2020-02-01 DIAGNOSIS — E785 Hyperlipidemia, unspecified: Secondary | ICD-10-CM | POA: Diagnosis not present

## 2020-02-01 DIAGNOSIS — Z0000 Encounter for general adult medical examination without abnormal findings: Secondary | ICD-10-CM

## 2020-02-01 DIAGNOSIS — Z79899 Other long term (current) drug therapy: Secondary | ICD-10-CM | POA: Diagnosis not present

## 2020-02-01 LAB — CBC WITH DIFFERENTIAL/PLATELET
Basophils Absolute: 0.1 10*3/uL (ref 0.0–0.1)
Basophils Relative: 1.2 % (ref 0.0–3.0)
Eosinophils Absolute: 0.1 10*3/uL (ref 0.0–0.7)
Eosinophils Relative: 0.8 % (ref 0.0–5.0)
HCT: 43.9 % (ref 39.0–52.0)
Hemoglobin: 14.4 g/dL (ref 13.0–17.0)
Lymphocytes Relative: 22.3 % (ref 12.0–46.0)
Lymphs Abs: 1.5 10*3/uL (ref 0.7–4.0)
MCHC: 32.9 g/dL (ref 30.0–36.0)
MCV: 84.2 fl (ref 78.0–100.0)
Monocytes Absolute: 0.4 10*3/uL (ref 0.1–1.0)
Monocytes Relative: 6.1 % (ref 3.0–12.0)
Neutro Abs: 4.5 10*3/uL (ref 1.4–7.7)
Neutrophils Relative %: 69.6 % (ref 43.0–77.0)
Platelets: 427 10*3/uL — ABNORMAL HIGH (ref 150.0–400.0)
RBC: 5.21 Mil/uL (ref 4.22–5.81)
RDW: 15.3 % (ref 11.5–15.5)
WBC: 6.5 10*3/uL (ref 4.0–10.5)

## 2020-02-01 LAB — COMPREHENSIVE METABOLIC PANEL
ALT: 39 U/L (ref 0–53)
AST: 22 U/L (ref 0–37)
Albumin: 4.8 g/dL (ref 3.5–5.2)
Alkaline Phosphatase: 51 U/L (ref 39–117)
BUN: 13 mg/dL (ref 6–23)
CO2: 27 mEq/L (ref 19–32)
Calcium: 10 mg/dL (ref 8.4–10.5)
Chloride: 101 mEq/L (ref 96–112)
Creatinine, Ser: 0.9 mg/dL (ref 0.40–1.50)
GFR: 101.84 mL/min (ref >60.00)
Glucose, Bld: 90 mg/dL (ref 70–99)
Potassium: 4.3 mEq/L (ref 3.5–5.1)
Sodium: 138 mEq/L (ref 135–145)
Total Bilirubin: 0.5 mg/dL (ref 0.2–1.2)
Total Protein: 7.4 g/dL (ref 6.0–8.3)

## 2020-02-01 LAB — LIPID PANEL
Cholesterol: 202 mg/dL — ABNORMAL HIGH (ref 0–200)
HDL: 40.3 mg/dL (ref >39.00)
LDL Cholesterol: 127 mg/dL — ABNORMAL HIGH (ref 0–99)
NonHDL: 162.18
Total CHOL/HDL Ratio: 5
Triglycerides: 176 mg/dL — ABNORMAL HIGH (ref 0.0–149.0)
VLDL: 35.2 mg/dL (ref 0.0–40.0)

## 2020-02-01 LAB — TSH: TSH: 0.67 u[IU]/mL (ref 0.35–4.50)

## 2020-02-01 NOTE — Patient Instructions (Signed)

## 2020-02-01 NOTE — Progress Notes (Signed)
Office Note 02/01/2020  CC:  Chief Complaint  Patient presents with  . Annual Exam    HPI:  Michael Moon is a 24 y.o. White male who is here for annual health maintenance exam. Feeling well, no complaints.  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 Exercising: Walking 1 mild 3-4 times/week. Diet: cutting out fast food and other restaurants, restrict beef. No alcohol.  Past Medical History:  Diagnosis Date  . Anal fissure   . Anemia   . Crohn disease ([place]) dx 2012   Doing well on lialda and mercaptopurine as of 01/2018 (Dr. Carlean Purl). 03/2019 colonoscopy->mild active colitis. Recall 1 yr.  Marland Kitchen GERD (gastroesophageal reflux disease)   . Hx of adenomatous colonic polyps 03/18/2019  . Hyperlipemia, mixed 2019,2020   TLC  . Obesity, Class I, BMI 30-34.9   . Seasonal allergic rhinitis     Past Surgical History:  Procedure Laterality Date  . COLONOSCOPY  2012;03/16/2019   2012 crohns. 03/2019->2 adenomas, mild active colitis, otherwise in remission. Recall 1 yr.  . ESOPHAGOGASTRODUODENOSCOPY  2012  . TONSILLECTOMY AND ADENOIDECTOMY  08/13/2005    Family History  Problem Relation Age of Onset  . Arthritis Mother   . Hypertension Mother   . Brain cancer Father   . Hyperlipidemia Father   . Hypertension Father   . Arthritis Maternal Grandmother   . Diabetes Maternal Grandmother   . Hypertension Maternal Grandmother   . Stroke Maternal Grandmother   . Stroke Maternal Grandfather   . Hearing loss Maternal Grandfather   . Alcohol abuse Maternal Grandfather   . Alcohol abuse Paternal Grandmother   . Hypertension Paternal Grandmother   . Alcohol abuse Paternal Grandfather   . Early death Paternal Grandfather        unknown cause  . Rectal cancer Neg Hx   . Colon cancer Neg Hx     Social History   Socioeconomic History  . Marital status: Single    Spouse name: Not on file  . Number of children: 0  . Years of  education: Not on file  . Highest education level: Not on file  Occupational History  . Occupation: MRI Psychologist, clinical: [employer]  Tobacco Use  . Smoking status: Never Smoker  . Smokeless tobacco: Never Used  Vaping Use  . Vaping Use: Never used  Substance and Sexual Activity  . Alcohol use: No  . Drug use: No  . Sexual activity: Not on file  Other Topics Concern  . Not on file  Social History Narrative   Single, no children.   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   No T/A/Ds.   Lives with mom in [place] Strain:   . Difficulty of Paying Living Expenses:   Food Insecurity:   . Worried About Charity fundraiser in the Last Year:   . Arboriculturist in the Last Year:   Transportation Needs:   . Film/video editor (Medical):   Marland Kitchen Lack of Transportation (Non-Medical):   Physical Activity:   . Days of Exercise per Week:   . Minutes of Exercise per Session:   Stress:   . Feeling of Stress :   Social Connections:   . Frequency of Communication with Friends and Family:   . Frequency of Social Gatherings with Friends and Family:   .  Attends Religious Services:   . Active Member of Clubs or Organizations:   . Attends Archivist Meetings:   Marland Kitchen Marital Status:   Intimate Partner Violence:   . Fear of Current or Ex-Partner:   . Emotionally Abused:   Marland Kitchen Physically Abused:   . Sexually Abused:     Outpatient Medications Prior to Visit  Medication Sig Dispense Refill  . cetirizine (ZYRTEC) 10 MG tablet Take 10 mg by mouth daily.    . mercaptopurine (PURINETHOL) 50 MG tablet TAKE 2-3 TABLETS BY MOUTH DAILY. AS DIRECTED. GIVE ON AN EMPTY STOMACH 1 HOUR BEFORE OR 2 HOURS AFTER MEALS. NEED LABS 270 tablet 0  . mesalamine (LIALDA) 1.2 g EC tablet Take 4 tablets (4.8 g total) by mouth daily with breakfast. 360 tablet 3  . omeprazole (PRILOSEC) 40 MG capsule TAKE 1  CAPSULE (40 MG TOTAL) BY MOUTH DAILY. 90 capsule 3  . pramoxine-hydrocortisone (PROCTOCREAM-HC) 1-1 % rectal cream Place 1 application rectally 2 (two) times daily as needed for hemorrhoids or anal itching. 30 g 1   No facility-administered medications prior to visit.    Allergies  Allergen Reactions  . Codeine Rash    ROS Review of Systems  Constitutional: Negative for appetite change, chills, fatigue and fever.  HENT: Negative for congestion, dental problem, ear pain and sore throat.   Eyes: Negative for discharge, redness and visual disturbance.  Respiratory: Negative for cough, chest tightness, shortness of breath and wheezing.   Cardiovascular: Negative for chest pain, palpitations and leg swelling.  Gastrointestinal: Negative for abdominal pain, blood in stool, diarrhea, nausea and vomiting.  Genitourinary: Negative for difficulty urinating, dysuria, flank pain, frequency, hematuria and urgency.  Musculoskeletal: Negative for arthralgias, back pain, joint swelling, myalgias and neck stiffness.  Skin: Negative for pallor and rash.  Neurological: Negative for dizziness, speech difficulty, weakness and headaches.  Hematological: Negative for adenopathy. Does not bruise/bleed easily.  Psychiatric/Behavioral: Negative for confusion and sleep disturbance. The patient is not nervous/anxious.     PE; Vitals with BMI 02/01/2020 09/17/2019 07/01/2019  Height 6' 0.75" 6\' 0"  6\' 0"   Weight 275 lbs 8 oz 280 lbs 276 lbs 10 oz  BMI 36.59 67.20 94.70  Systolic 962 836 629  Diastolic 84 90 88  Pulse 79 88 81    Gen: Alert, well appearing.  Patient is oriented to person, place, time, and situation. AFFECT: pleasant, lucid thought and speech. ENT: Ears: EACs clear, normal epithelium.  TMs with good light reflex and landmarks bilaterally.  Eyes: no injection, icteris, swelling, or exudate.  EOMI, PERRLA. Nose: no drainage or turbinate edema/swelling.  No injection or focal lesion.  Mouth: lips  without lesion/swelling.  Oral mucosa pink and moist.  Dentition intact and without obvious caries or gingival swelling.  Oropharynx without erythema, exudate, or swelling.  Neck: supple/nontender.  No LAD, mass, or TM.  Carotid pulses 2+ bilaterally, without bruits. CV: RRR, no m/r/g.   LUNGS: CTA bilat, nonlabored resps, good aeration in all lung fields. ABD: soft, NT, ND, BS normal.  No hepatospenomegaly or mass.  No bruits. EXT: no clubbing, cyanosis, or edema.  Musculoskeletal: no joint swelling, erythema, warmth, or tenderness.  ROM of all joints intact. Skin - no sores or suspicious lesions or rashes or color changes   Pertinent labs:  Lab Results  Component Value Date   TSH 1.11 05/01/2019   Lab Results  Component Value Date   WBC 8.3 09/17/2019   HGB 15.1 09/17/2019   HCT  45.0 09/17/2019   MCV 83.4 09/17/2019   PLT 414.0 (H) 09/17/2019   Lab Results  Component Value Date   CREATININE 0.90 08/18/2019   BUN 10 08/18/2019   NA 139 08/18/2019   K 3.8 08/18/2019   CL 104 08/18/2019   CO2 25 08/18/2019   Lab Results  Component Value Date   ALT 37 09/17/2019   AST 17 09/17/2019   ALKPHOS 62 09/17/2019   BILITOT 0.3 09/17/2019   Lab Results  Component Value Date   CHOL 210 (H) 05/01/2019   Lab Results  Component Value Date   HDL 39.50 05/01/2019   No results found for: [facility] Lab Results  Component Value Date   TRIG 245.0 (H) 05/01/2019   Lab Results  Component Value Date   CHOLHDL 5 05/01/2019   ASSESSMENT AND PLAN:   Health maintenance exam: Reviewed age and gender appropriate health maintenance issues (prudent diet, regular exercise, health risks of tobacco and excessive alcohol, use of seatbelts, fire alarms in home, use of sunscreen).  Also reviewed age and gender appropriate health screening as well as vaccine recommendations. Vaccines: Tdap UTD.  Covid 19 vaccine UTD as well. Labs: fasting HP labs ordered.  An After Visit Summary was printed and  given to the patient.  FOLLOW UP:  Return in about 1 year (around 01/31/2021) for annual CPE (fasting).  Signed:  Crissie Sickles, MD           02/01/2020

## 2020-03-02 ENCOUNTER — Encounter: Payer: Self-pay | Admitting: Orthopedic Surgery

## 2020-03-16 ENCOUNTER — Encounter: Payer: Self-pay | Admitting: Orthopedic Surgery

## 2020-03-16 ENCOUNTER — Other Ambulatory Visit: Payer: Self-pay | Admitting: Internal Medicine

## 2020-03-17 NOTE — Telephone Encounter (Signed)
Referral has been reopened as requested

## 2020-03-22 ENCOUNTER — Ambulatory Visit: Admission: RE | Admit: 2020-03-22 | Payer: 59 | Source: Ambulatory Visit

## 2020-04-19 ENCOUNTER — Ambulatory Visit: Payer: 59 | Attending: Internal Medicine

## 2020-04-19 DIAGNOSIS — Z23 Encounter for immunization: Secondary | ICD-10-CM

## 2020-04-19 NOTE — Progress Notes (Signed)
   Covid-19 Vaccination Clinic  Name:  Michael Moon    MRN: [id number] DOB: Jan 02, 1996  04/19/2020  Michael Moon was observed post Covid-19 immunization for 15 minutes without incident. He was provided with Vaccine Information Sheet and instruction to access the V-Safe system.  Vaccine administered by Marene Lenz - [employer] PharmD Student.  Michael Moon was instructed to call 911 with any severe reactions post vaccine: Marland Kitchen Difficulty breathing  . Swelling of face and throat  . A fast heartbeat  . A bad rash all over body  . Dizziness and weakness

## 2020-06-20 ENCOUNTER — Other Ambulatory Visit: Payer: Self-pay | Admitting: Internal Medicine

## 2020-08-13 DIAGNOSIS — I67850 Cerebral autosomal dominant arteriopathy with subcortical infarcts and leukoencephalopathy: Secondary | ICD-10-CM

## 2020-08-13 HISTORY — DX: Cerebral autosomal dominant arteriopathy with subcortical infarcts and leukoencephalopathy: I67.850

## 2020-08-22 ENCOUNTER — Encounter: Payer: Self-pay | Admitting: Family Medicine

## 2020-08-22 DIAGNOSIS — K50919 Crohn's disease, unspecified, with unspecified complications: Secondary | ICD-10-CM

## 2020-08-22 NOTE — Telephone Encounter (Signed)
OK new GI referral for Dr. Carlean Purl ordered.

## 2020-09-13 ENCOUNTER — Ambulatory Visit: Payer: No Typology Code available for payment source | Admitting: Internal Medicine

## 2020-09-13 ENCOUNTER — Other Ambulatory Visit (INDEPENDENT_AMBULATORY_CARE_PROVIDER_SITE_OTHER): Payer: No Typology Code available for payment source

## 2020-09-13 VITALS — BP 144/86 | HR 70 | Ht 72.0 in | Wt 282.0 lb

## 2020-09-13 DIAGNOSIS — Z7960 Long term (current) use of unspecified immunomodulators and immunosuppressants: Secondary | ICD-10-CM

## 2020-09-13 DIAGNOSIS — K219 Gastro-esophageal reflux disease without esophagitis: Secondary | ICD-10-CM

## 2020-09-13 DIAGNOSIS — E6609 Other obesity due to excess calories: Secondary | ICD-10-CM | POA: Diagnosis not present

## 2020-09-13 DIAGNOSIS — Z79899 Other long term (current) drug therapy: Secondary | ICD-10-CM | POA: Diagnosis not present

## 2020-09-13 DIAGNOSIS — K5010 Crohn's disease of large intestine without complications: Secondary | ICD-10-CM

## 2020-09-13 DIAGNOSIS — Z6838 Body mass index (BMI) 38.0-38.9, adult: Secondary | ICD-10-CM

## 2020-09-13 DIAGNOSIS — Z86010 Personal history of colonic polyps: Secondary | ICD-10-CM

## 2020-09-13 LAB — CBC WITH DIFFERENTIAL/PLATELET
Basophils Absolute: 0 10*3/uL (ref 0.0–0.1)
Basophils Relative: 0.6 % (ref 0.0–3.0)
Eosinophils Absolute: 0 10*3/uL (ref 0.0–0.7)
Eosinophils Relative: 0.7 % (ref 0.0–5.0)
HCT: 41.7 % (ref 39.0–52.0)
Hemoglobin: 14.3 g/dL (ref 13.0–17.0)
Lymphocytes Relative: 21.2 % (ref 12.0–46.0)
Lymphs Abs: 1.5 10*3/uL (ref 0.7–4.0)
MCHC: 34.2 g/dL (ref 30.0–36.0)
MCV: 88.2 fl (ref 78.0–100.0)
Monocytes Absolute: 0.3 10*3/uL (ref 0.1–1.0)
Monocytes Relative: 4.7 % (ref 3.0–12.0)
Neutro Abs: 5 10*3/uL (ref 1.4–7.7)
Neutrophils Relative %: 72.8 % (ref 43.0–77.0)
Platelets: 394 10*3/uL (ref 150.0–400.0)
RBC: 4.73 Mil/uL (ref 4.22–5.81)
RDW: 16.5 % — ABNORMAL HIGH (ref 11.5–15.5)
WBC: 6.9 10*3/uL (ref 4.0–10.5)

## 2020-09-13 LAB — COMPREHENSIVE METABOLIC PANEL
ALT: 34 U/L (ref 0–53)
AST: 20 U/L (ref 0–37)
Albumin: 4.7 g/dL (ref 3.5–5.2)
Alkaline Phosphatase: 64 U/L (ref 39–117)
BUN: 13 mg/dL (ref 6–23)
CO2: 27 mEq/L (ref 19–32)
Calcium: 9.9 mg/dL (ref 8.4–10.5)
Chloride: 102 mEq/L (ref 96–112)
Creatinine, Ser: 0.82 mg/dL (ref 0.40–1.50)
GFR: 120.97 mL/min (ref >60.00)
Glucose, Bld: 91 mg/dL (ref 70–99)
Potassium: 4 mEq/L (ref 3.5–5.1)
Sodium: 137 mEq/L (ref 135–145)
Total Bilirubin: 0.4 mg/dL (ref 0.2–1.2)
Total Protein: 7.9 g/dL (ref 6.0–8.3)

## 2020-09-13 NOTE — Progress Notes (Signed)
Michael Moon 25 y.o. [birth year] [id number]  Assessment & Plan:   Crohn's disease of colon ([place]) Currently asymptomatic on regimen of 6-MP and Lialda.  Recheck labs including thio purine metabolites.  Schedule colonoscopy.The risks and benefits as well as alternatives of endoscopic procedure(s) have been discussed and reviewed. All questions answered. The patient agrees to proceed.   Long-term use of immunosuppressant medication - 6 mercaptopurine I have recommended he see a dermatologist annually due to increased skin cancer risk using immunomodulators specifically 6-MP will remind him when he comes for colonoscopy  Hx of adenomatous colonic polyps Schedule surveillance colonoscopy The risks and benefits as well as alternatives of endoscopic procedure(s) have been discussed and reviewed. All questions answered. The patient agrees to proceed.   Class 2 obesity due to excess calories with body mass index (BMI) of 38.0 to 38.9 in adult We reviewed low-carb diet and intermittent fasting and it was recommended  GERD (gastroesophageal reflux disease) Continue omeprazole.  Father had history of Barrett's esophagus.  Schedule EGD for screening for Barrett's   Orders Placed This Encounter  Procedures  . CBC with Differential/Platelet  . Comprehensive metabolic panel  . Thiopurine Metabolites  . Ambulatory referral to Gastroenterology    I appreciate the opportunity to care for this patient. CC: Tammi Sou, MD   Subjective:   Chief Complaint: Follow-up of Crohn's colitis on 6-MP and Lialda, GERD and omeprazole and history of adenomatous colonic polyps  HPI 25 year old man with Crohn's colitis, GERD and obesity here for follow-up having last been seen in February 2021.  Takes 6-MP and omeprazole.  Lialda also. Thio purine metabolites 6-TG 167 6 MMP 2818 in February 2021.  He had had very high levels of 6 MMP at 1 point so dose adjusting was done.  Wt Readings from Last  3 Encounters:  09/13/20 282 lb (127.9 kg)  02/01/20 275 lb 8 oz (125 kg)  09/17/19 280 lb (127 kg)   He reports that he is doing well except for occasional diarrhea with spicy foods.  He did try low-carb eating as recommended last year but says the holidays "messed me up".  He does not see a dermatologist regularly and says he does not go out in the sun much. Allergies  Allergen Reactions  . Codeine Rash   Current Meds  Medication Sig  . cetirizine (ZYRTEC) 10 MG tablet Take 10 mg by mouth daily.  Marland Kitchen LIALDA 1.2 g EC tablet TAKE 4 TABLETS BY MOUTH DAILY WITH BREAKFAST.  Marland Kitchen mercaptopurine (PURINETHOL) 50 MG tablet TAKE 2 TO 3 TABLETS BY MOUTH DAILY. AS DIRECTED. GIVE ON AN EMPTY STOMACH 1 HOUR BEFORE OR 2 HOURS AFTER MEALS. NEED LABS  . omeprazole (PRILOSEC) 40 MG capsule TAKE 1 CAPSULE BY MOUTH DAILY.  . pramoxine-hydrocortisone (PROCTOCREAM-HC) 1-1 % rectal cream Place 1 application rectally 2 (two) times daily as needed for hemorrhoids or anal itching.   Past Medical History:  Diagnosis Date  . Anal fissure   . Anemia   . Crohn disease ([place]) dx 2012   Doing well on lialda and mercaptopurine as of 01/2018 (Dr. Carlean Purl). 03/2019 colonoscopy->mild active colitis. Recall 1 yr.  Marland Kitchen GERD (gastroesophageal reflux disease)   . Hx of adenomatous colonic polyps 03/18/2019  . Hyperlipemia, mixed 2019,2020   TLC  . Obesity, Class I, BMI 30-34.9   . Seasonal allergic rhinitis    Past Surgical History:  Procedure Laterality Date  . COLONOSCOPY  2012;03/16/2019   2012 crohns. 03/2019->2 adenomas, mild  active colitis, otherwise in remission. Recall 1 yr.  . ESOPHAGOGASTRODUODENOSCOPY  2012  . TONSILLECTOMY AND ADENOIDECTOMY  08/13/2005   Social History   Social History Narrative   Single, no children.   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   No T/A/Ds.   Lives with mom in [place]   family history includes Alcohol abuse in his maternal grandfather,  paternal grandfather, and paternal grandmother; Arthritis in his maternal grandmother and mother; Brain cancer in his father; Diabetes in his maternal grandmother; Early death in his paternal grandfather; Hearing loss in his maternal grandfather; Hyperlipidemia in his father; Hypertension in his father, maternal grandmother, mother, and paternal grandmother; Stroke in his maternal grandfather and maternal grandmother.   Review of Systems   Objective:   Physical Exam

## 2020-09-13 NOTE — Patient Instructions (Addendum)
   Here are some resources to help you change how you eat and to overcome insulin resistance and live a healthier life.  Read The Obesity Code by Dr. Sharman Cheek or Fast, Feat, Repeat by Kizzie Furnish and implement  suggestions. Investigate and sign up for the dietdoctor website if desired and utilize those resources Read handouts on insulin resistance and proper food choices to reduce that and the use of restricted feeding provided to the patient. Have a long-term approach to this and do not expect rapid results but have a 1 to 2-year timeframe to change her eating and to become fat adapted which means using fat for energy instead of sugars and carbohydrates so much.  Your provider has requested that you go to the basement level for lab work before leaving today. Press "B" on the elevator. The lab is located at the first door on the left as you exit the elevator.  Due to recent changes in healthcare laws, you may see the results of your imaging and laboratory studies on MyChart before your provider has had a chance to review them.  We understand that in some cases there may be results that are confusing or concerning to you. Not all laboratory results come back in the same time frame and the provider may be waiting for multiple results in order to interpret others.  Please give [place] 48 hours in order for your provider to thoroughly review all the results before contacting the office for clarification of your results.   You have been scheduled for an endoscopy and colonoscopy. Please follow the written instructions given to you at your visit today. Please pick up your prep supplies at the pharmacy within the next 1-3 days. If you use inhalers (even only as needed), please bring them with you on the day of your procedure.  I appreciate the opportunity to care for you. Silvano Rusk, MD, [facility]

## 2020-09-14 ENCOUNTER — Encounter: Payer: Self-pay | Admitting: Internal Medicine

## 2020-09-14 NOTE — Assessment & Plan Note (Signed)
Continue omeprazole.  Father had history of Barrett's esophagus.  Schedule EGD for screening for Barrett's

## 2020-09-14 NOTE — Assessment & Plan Note (Signed)
We reviewed low-carb diet and intermittent fasting and it was recommended

## 2020-09-14 NOTE — Assessment & Plan Note (Signed)
I have recommended he see a dermatologist annually due to increased skin cancer risk using immunomodulators specifically 6-MP will remind him when he comes for colonoscopy

## 2020-09-14 NOTE — Assessment & Plan Note (Signed)
Schedule surveillance colonoscopy The risks and benefits as well as alternatives of endoscopic procedure(s) have been discussed and reviewed. All questions answered. The patient agrees to proceed.  

## 2020-09-14 NOTE — Assessment & Plan Note (Signed)
Currently asymptomatic on regimen of 6-MP and Lialda.  Recheck labs including thio purine metabolites.  Schedule colonoscopy.The risks and benefits as well as alternatives of endoscopic procedure(s) have been discussed and reviewed. All questions answered. The patient agrees to proceed.

## 2020-09-16 LAB — THIOPURINE METABOLITES

## 2020-10-11 DIAGNOSIS — D131 Benign neoplasm of stomach: Secondary | ICD-10-CM

## 2020-10-11 HISTORY — DX: Benign neoplasm of stomach: D13.1

## 2020-10-26 ENCOUNTER — Encounter: Payer: Self-pay | Admitting: Internal Medicine

## 2020-10-26 ENCOUNTER — Ambulatory Visit (AMBULATORY_SURGERY_CENTER): Payer: No Typology Code available for payment source | Admitting: Internal Medicine

## 2020-10-26 ENCOUNTER — Other Ambulatory Visit: Payer: Self-pay

## 2020-10-26 ENCOUNTER — Other Ambulatory Visit: Payer: No Typology Code available for payment source

## 2020-10-26 VITALS — BP 133/89 | HR 76 | Temp 98.9°F | Resp 20 | Ht 72.0 in | Wt 282.0 lb

## 2020-10-26 DIAGNOSIS — K2100 Gastro-esophageal reflux disease with esophagitis, without bleeding: Secondary | ICD-10-CM | POA: Diagnosis not present

## 2020-10-26 DIAGNOSIS — K2950 Unspecified chronic gastritis without bleeding: Secondary | ICD-10-CM

## 2020-10-26 DIAGNOSIS — K635 Polyp of colon: Secondary | ICD-10-CM

## 2020-10-26 DIAGNOSIS — K5010 Crohn's disease of large intestine without complications: Secondary | ICD-10-CM | POA: Diagnosis not present

## 2020-10-26 DIAGNOSIS — Z79899 Other long term (current) drug therapy: Secondary | ICD-10-CM

## 2020-10-26 DIAGNOSIS — E6609 Other obesity due to excess calories: Secondary | ICD-10-CM

## 2020-10-26 DIAGNOSIS — Z7960 Long term (current) use of unspecified immunomodulators and immunosuppressants: Secondary | ICD-10-CM

## 2020-10-26 DIAGNOSIS — K621 Rectal polyp: Secondary | ICD-10-CM

## 2020-10-26 DIAGNOSIS — K317 Polyp of stomach and duodenum: Secondary | ICD-10-CM

## 2020-10-26 DIAGNOSIS — K219 Gastro-esophageal reflux disease without esophagitis: Secondary | ICD-10-CM

## 2020-10-26 DIAGNOSIS — K209 Esophagitis, unspecified without bleeding: Secondary | ICD-10-CM

## 2020-10-26 DIAGNOSIS — D128 Benign neoplasm of rectum: Secondary | ICD-10-CM

## 2020-10-26 MED ORDER — SODIUM CHLORIDE 0.9 % IV SOLN
500.0000 mL | Freq: Once | INTRAVENOUS | Status: DC
Start: 2020-10-26 — End: 2020-10-26

## 2020-10-26 NOTE — Op Note (Signed)
[place] Patient Name: Michael Moon Procedure Date: 10/26/2020 2:52 PM MRN: [id number] Endoscopist: Gatha Mayer , MD Age: 25 Referring MD:  Date of Birth: 1996-07-25 Gender: Male Account #: [id number] Procedure:                Upper GI endoscopy Indications:              Esophageal reflux, Follow-up of esophageal reflux,                            Exclusion of Barrett's esophagus Medicines:                Propofol per Anesthesia, Monitored Anesthesia Care Procedure:                Pre-Anesthesia Assessment:                           - Prior to the procedure, a History and Physical                            was performed, and patient medications and                            allergies were reviewed. The patient's tolerance of                            previous anesthesia was also reviewed. The risks                            and benefits of the procedure and the sedation                            options and risks were discussed with the patient.                            All questions were answered, and informed consent                            was obtained. Prior Anticoagulants: The patient has                            taken no previous anticoagulant or antiplatelet                            agents. ASA Grade Assessment: II - A patient with                            mild systemic disease. After reviewing the risks                            and benefits, the patient was deemed in                            satisfactory condition to undergo the procedure.  After obtaining informed consent, the endoscope was                            passed under direct vision. Throughout the                            procedure, the patient's blood pressure, pulse, and                            oxygen saturations were monitored continuously. The                            Endoscope was introduced through the mouth, and                             advanced to the second part of duodenum. The upper                            GI endoscopy was accomplished without difficulty.                            The patient tolerated the procedure well. Scope In: Scope Out: Findings:                 LA Grade A (one or more mucosal breaks less than 5                            mm, not extending between tops of 2 mucosal folds)                            esophagitis with bleeding was found in the distal                            esophagus. Biopsies were taken with a cold forceps                            for histology. Verification of patient                            identification for the specimen was done. Estimated                            blood loss was minimal.                           One 5 mm semi-sessile polyp was found in the                            gastric fundus. Biopsies were taken with a cold                            forceps for histology. Verification of patient  identification for the specimen was done. Estimated                            blood loss was minimal.                           The exam was otherwise without abnormality.                           The cardia and gastric fundus were normal on                            retroflexion. Complications:            No immediate complications. Estimated Blood Loss:     Estimated blood loss was minimal. Impression:               - LA Grade A reflux esophagitis with bleeding.                            Biopsied.                           - One gastric polyp. Biopsied.                           - The examination was otherwise normal. Recommendation:           - Patient has a contact number available for                            emergencies. The signs and symptoms of potential                            delayed complications were discussed with the                            patient. Return to normal activities tomorrow.                             Written discharge instructions were provided to the                            patient.                           - Resume previous diet.                           - Continue present medications. Consider change of                            PPI                           - See the other procedure note for documentation of  additional recommendations. Colonoscopy next Gatha Mayer, MD 10/26/2020 3:48:43 PM This report has been signed electronically.

## 2020-10-26 NOTE — Progress Notes (Signed)
Called to room to assist during endoscopic procedure.  Patient ID and intended procedure confirmed with present staff. Received instructions for my participation in the procedure from the performing physician.  

## 2020-10-26 NOTE — Op Note (Signed)
[facility] Patient Name: Michael Moon Procedure Date: 10/26/2020 2:51 PM MRN: [id number] Endoscopist: Gatha Mayer , MD Age: 25 Referring MD:  Date of Birth: February 26, 1996 Gender: Male Account #: [id number] Procedure:                Colonoscopy Indications:              High risk colon cancer surveillance: Personal                            history of colonic polyps, High risk colon cancer                            surveillance: Crohn's colitis of 8 (or more) years                            duration with one-third (or more) of the colon                            involved Medicines:                Propofol per Anesthesia, Monitored Anesthesia Care Procedure:                Pre-Anesthesia Assessment:                           - Prior to the procedure, a History and Physical                            was performed, and patient medications and                            allergies were reviewed. The patient's tolerance of                            previous anesthesia was also reviewed. The risks                            and benefits of the procedure and the sedation                            options and risks were discussed with the patient.                            All questions were answered, and informed consent                            was obtained. Prior Anticoagulants: The patient has                            taken no previous anticoagulant or antiplatelet                            agents. ASA Grade Assessment: II - A patient with  mild systemic disease. After reviewing the risks                            and benefits, the patient was deemed in                            satisfactory condition to undergo the procedure.                           After obtaining informed consent, the colonoscope                            was passed under direct vision. Throughout the                            procedure, the patient's blood pressure,  pulse, and                            oxygen saturations were monitored continuously. The                            Olympus CF-HQ190L [id number]) Colonoscope was                            introduced through the anus and advanced to the the                            terminal ileum, with identification of the                            appendiceal orifice and IC valve. The colonoscopy                            was performed without difficulty. The patient                            tolerated the procedure well. The quality of the                            bowel preparation was adequate. The terminal ileum,                            ileocecal valve, appendiceal orifice, and rectum                            were photographed. The bowel preparation used was                            Miralax via split dose instruction. Scope In: 3:13:04 PM Scope Out: 3:35:38 PM Scope Withdrawal Time: 0 hours 20 minutes 38 seconds  Total Procedure Duration: 0 hours 22 minutes 34 seconds  Findings:                 The perianal and digital rectal examinations were  normal. Pertinent negatives include normal prostate                            (size, shape, and consistency).                           A diminutive polyp was found in the rectum. The                            polyp was semi-pedunculated. The polyp was removed                            with a cold snare. Resection and retrieval were                            complete. Verification of patient identification                            for the specimen was done. Estimated blood loss was                            minimal.                           The terminal ileum appeared normal.                           Diffuse pseudopolyps were found in the sigmoid                            colon, in the descending colon, in the transverse                            colon, at the hepatic flexure and in the ascending                             colon.                           The exam was otherwise without abnormality on                            direct and retroflexion views.                           Two biopsies were taken every 10 cm with a cold                            forceps from the entire colon for Crohn's disease                            surveillance. These biopsy specimens from the cecum                            +ascending colon bottle  3, transverse colon bottle                            4, descending colon + proximal sigmoid colon bottle                            5 and distal sigmoid + rectum bottle 6 were sent to                            Pathology. Verification of patient identification                            for the specimen was done. Estimated blood loss was                            minimal.                           The exam was otherwise without abnormality on                            direct and retroflexion views. Complications:            No immediate complications. Estimated Blood Loss:     Estimated blood loss was minimal. Impression:               - One diminutive polyp in the rectum, removed with                            a cold snare. Resected and retrieved. Including                            surrounding tissue.                           - The examined portion of the ileum was normal.                           - Pseudopolyps in the sigmoid colon, in the                            descending colon, in the transverse colon, at the                            hepatic flexure and in the ascending colon.                           - The examination was otherwise normal on direct                            and retroflexion views.                           - The examination was otherwise normal on direct  and retroflexion views.                           - Biopsies for surveillance were taken from the                            entire colon.                            - Personal history of colonic polyp diminutive                            adenoma 2020. Recommendation:           - Patient has a contact number available for                            emergencies. The signs and symptoms of potential                            delayed complications were discussed with the                            patient. Return to normal activities tomorrow.                            Written discharge instructions were provided to the                            patient.                           - Resume previous diet.                           - Continue present medications.                           - Await pathology results.                           - Repeat colonoscopy is recommended for                            surveillance. The colonoscopy date will be                            determined after pathology results from today's                            exam become available for review.                           - To lab for thiopurine metabolites today Gatha Mayer, MD 10/26/2020 3:54:54 PM This report has been signed electronically.

## 2020-10-26 NOTE — Patient Instructions (Addendum)
There was some reflux esophagitis seen - biopsied.  One small stomach polyp - removed.  No Barrett's esophagus.   The colon revealed one polyp that I removed, pseudopolyps (scars) and otherwise normal - so was ileum.  I will contact you about the biopsies and plans.  Please go to the lab for the metabolite testing.  I appreciate the opportunity to care for you. Gatha Mayer, MD, [facility]   Please read handouts provided. Continue present medications. Await pathology results.   YOU HAD AN ENDOSCOPIC PROCEDURE TODAY AT [facility]:   Refer to the procedure report that was given to you for any specific questions about what was found during the examination.  If the procedure report does not answer your questions, please call your gastroenterologist to clarify.  If you requested that your care partner not be given the details of your procedure findings, then the procedure report has been included in a sealed envelope for you to review at your convenience later.  YOU SHOULD EXPECT: Some feelings of bloating in the abdomen. Passage of more gas than usual.  Walking can help get rid of the air that was put into your GI tract during the procedure and reduce the bloating. If you had a lower endoscopy (such as a colonoscopy or flexible sigmoidoscopy) you may notice spotting of blood in your stool or on the toilet paper. If you underwent a bowel prep for your procedure, you may not have a normal bowel movement for a few days.  Please Note:  You might notice some irritation and congestion in your nose or some drainage.  This is from the oxygen used during your procedure.  There is no need for concern and it should clear up in a day or so.  SYMPTOMS TO REPORT IMMEDIATELY:   Following lower endoscopy (colonoscopy or flexible sigmoidoscopy):  Excessive amounts of blood in the stool  Significant tenderness or worsening of abdominal pains  Swelling of the abdomen that is new,  acute  Fever of 100F or higher   Following upper endoscopy (EGD)  Vomiting of blood or coffee ground material  New chest pain or pain under the shoulder blades  Painful or persistently difficult swallowing  New shortness of breath  Fever of 100F or higher  Black, tarry-looking stools  For urgent or emergent issues, a gastroenterologist can be reached at any hour by calling [phone]. Do not use MyChart messaging for urgent concerns.    DIET:  We do recommend a small meal at first, but then you may proceed to your regular diet.  Drink plenty of fluids but you should avoid alcoholic beverages for 24 hours.  ACTIVITY:  You should plan to take it easy for the rest of today and you should NOT DRIVE or use heavy machinery until tomorrow (because of the sedation medicines used during the test).    FOLLOW UP: Our staff will call the number listed on your records 48-72 hours following your procedure to check on you and address any questions or concerns that you may have regarding the information given to you following your procedure. If we do not reach you, we will leave a message.  We will attempt to reach you two times.  During this call, we will ask if you have developed any symptoms of COVID 19. If you develop any symptoms (ie: fever, flu-like symptoms, shortness of breath, cough etc.) before then, please call [phone].  If you test positive for Covid 19 in the  2 weeks post procedure, please call and report this information to [place].    If any biopsies were taken you will be contacted by phone or by letter within the next 1-3 weeks.  Please call us at [phone] if you have not heard about the biopsies in 3 weeks.    SIGNATURES/CONFIDENTIALITY: You and/or your care partner have signed paperwork which will be entered into your electronic medical record.  These signatures attest to the fact that that the information above on your After Visit Summary has been reviewed and is  understood.  Full responsibility of the confidentiality of this discharge information lies with you and/or your care-partner.

## 2020-10-26 NOTE — Progress Notes (Signed)
VS-CW 

## 2020-10-26 NOTE — Progress Notes (Signed)
1454 Robinul 0.1 mg IV given due large amount of secretions upon assessment.  MD made aware, vss 

## 2020-10-26 NOTE — Progress Notes (Signed)
Report given to PACU, vss 

## 2020-10-28 ENCOUNTER — Telehealth: Payer: Self-pay

## 2020-10-28 ENCOUNTER — Telehealth: Payer: Self-pay | Admitting: *Deleted

## 2020-10-28 NOTE — Telephone Encounter (Signed)
Left message on follow up call. 

## 2020-10-28 NOTE — Telephone Encounter (Signed)
  Follow up Call-  Call back number 10/26/2020 03/16/2019  Post procedure Call Back phone  # [phone]  Permission to leave phone message Yes Yes  Some recent data might be hidden     Patient questions:  Message left to call us if necessary.  Second call.

## 2020-11-01 LAB — THIOPURINE METABOLITES
6 MMP(6-Methylmercaptopurine): <500 pmol/8x10(8)RBC (ref <5700)
6 TG(6-Thioguanine): 237 pmol/8x10(8)RBC (ref 235–400)

## 2020-11-04 ENCOUNTER — Encounter: Payer: Self-pay | Admitting: Internal Medicine

## 2020-11-04 ENCOUNTER — Other Ambulatory Visit: Payer: Self-pay | Admitting: Internal Medicine

## 2020-11-04 DIAGNOSIS — Z79899 Other long term (current) drug therapy: Secondary | ICD-10-CM

## 2020-11-04 DIAGNOSIS — Z7960 Long term (current) use of unspecified immunomodulators and immunosuppressants: Secondary | ICD-10-CM

## 2020-11-04 DIAGNOSIS — K5010 Crohn's disease of large intestine without complications: Secondary | ICD-10-CM

## 2020-12-21 ENCOUNTER — Other Ambulatory Visit: Payer: Self-pay

## 2020-12-21 ENCOUNTER — Other Ambulatory Visit: Payer: Self-pay | Admitting: Internal Medicine

## 2020-12-21 MED ORDER — MERCAPTOPURINE 50 MG PO TABS
ORAL_TABLET | ORAL | 0 refills | Status: DC
  Filled 2020-12-21: qty 270, 90d supply, fill #0

## 2020-12-21 MED FILL — Mesalamine Tab Delayed Release 1.2 GM: ORAL | 90 days supply | Qty: 360 | Fill #0 | Status: AC

## 2020-12-21 MED FILL — Omeprazole Cap Delayed Release 40 MG: ORAL | 90 days supply | Qty: 90 | Fill #0 | Status: AC

## 2020-12-22 ENCOUNTER — Other Ambulatory Visit: Payer: Self-pay

## 2021-02-02 ENCOUNTER — Other Ambulatory Visit: Payer: Self-pay

## 2021-02-02 ENCOUNTER — Encounter: Payer: Self-pay | Admitting: Family Medicine

## 2021-02-02 ENCOUNTER — Ambulatory Visit (INDEPENDENT_AMBULATORY_CARE_PROVIDER_SITE_OTHER): Payer: No Typology Code available for payment source | Admitting: Family Medicine

## 2021-02-02 VITALS — BP 112/71 | HR 80 | Temp 98.1°F | Resp 16 | Ht 73.0 in | Wt 268.6 lb

## 2021-02-02 DIAGNOSIS — Z79899 Other long term (current) drug therapy: Secondary | ICD-10-CM

## 2021-02-02 DIAGNOSIS — K50919 Crohn's disease, unspecified, with unspecified complications: Secondary | ICD-10-CM

## 2021-02-02 DIAGNOSIS — Z0000 Encounter for general adult medical examination without abnormal findings: Secondary | ICD-10-CM | POA: Diagnosis not present

## 2021-02-02 DIAGNOSIS — Z1283 Encounter for screening for malignant neoplasm of skin: Secondary | ICD-10-CM

## 2021-02-02 LAB — LIPID PANEL
Cholesterol: 215 mg/dL — ABNORMAL HIGH (ref 0–200)
HDL: 41.8 mg/dL (ref >39.00)
NonHDL: 172.74
Total CHOL/HDL Ratio: 5
Triglycerides: 247 mg/dL — ABNORMAL HIGH (ref 0.0–149.0)
VLDL: 49.4 mg/dL — ABNORMAL HIGH (ref 0.0–40.0)

## 2021-02-02 LAB — CBC WITH DIFFERENTIAL/PLATELET
Basophils Absolute: 0 10*3/uL (ref 0.0–0.1)
Basophils Relative: 0.3 % (ref 0.0–3.0)
Eosinophils Absolute: 0 10*3/uL (ref 0.0–0.7)
Eosinophils Relative: 0.5 % (ref 0.0–5.0)
HCT: 41.3 % (ref 39.0–52.0)
Hemoglobin: 14 g/dL (ref 13.0–17.0)
Lymphocytes Relative: 22.9 % (ref 12.0–46.0)
Lymphs Abs: 1.7 10*3/uL (ref 0.7–4.0)
MCHC: 34 g/dL (ref 30.0–36.0)
MCV: 87.6 fl (ref 78.0–100.0)
Monocytes Absolute: 0.5 10*3/uL (ref 0.1–1.0)
Monocytes Relative: 6.7 % (ref 3.0–12.0)
Neutro Abs: 5 10*3/uL (ref 1.4–7.7)
Neutrophils Relative %: 69.6 % (ref 43.0–77.0)
Platelets: 390 10*3/uL (ref 150.0–400.0)
RBC: 4.71 Mil/uL (ref 4.22–5.81)
RDW: 15.5 % (ref 11.5–15.5)
WBC: 7.2 10*3/uL (ref 4.0–10.5)

## 2021-02-02 LAB — COMPREHENSIVE METABOLIC PANEL
ALT: 32 U/L (ref 0–53)
AST: 23 U/L (ref 0–37)
Albumin: 4.7 g/dL (ref 3.5–5.2)
Alkaline Phosphatase: 68 U/L (ref 39–117)
BUN: 11 mg/dL (ref 6–23)
CO2: 26 mEq/L (ref 19–32)
Calcium: 9.6 mg/dL (ref 8.4–10.5)
Chloride: 100 mEq/L (ref 96–112)
Creatinine, Ser: 0.88 mg/dL (ref 0.40–1.50)
GFR: 118.09 mL/min (ref >60.00)
Glucose, Bld: 75 mg/dL (ref 70–99)
Potassium: 4 mEq/L (ref 3.5–5.1)
Sodium: 138 mEq/L (ref 135–145)
Total Bilirubin: 0.6 mg/dL (ref 0.2–1.2)
Total Protein: 7.5 g/dL (ref 6.0–8.3)

## 2021-02-02 LAB — TSH: TSH: 0.79 u[IU]/mL (ref 0.35–4.50)

## 2021-02-02 LAB — LDL CHOLESTEROL, DIRECT: Direct LDL: 142 mg/dL

## 2021-02-02 NOTE — Patient Instructions (Signed)
Health Maintenance, Male Adopting a healthy lifestyle and getting preventive care are important in promoting health and wellness. Ask your health care provider about: The right schedule for you to have regular tests and exams. Things you can do on your own to prevent diseases and keep yourself healthy. What should I know about diet, weight, and exercise? Eat a healthy diet  Eat a diet that includes plenty of vegetables, fruits, low-fat dairy products, and lean protein. Do not eat a lot of foods that are high in solid fats, added sugars, or sodium.  Maintain a healthy weight Body mass index (BMI) is a measurement that can be used to identify possible weight problems. It estimates body fat based on height and weight. Your health care provider can help determine your BMI and help you achieve or maintain ahealthy weight. Get regular exercise Get regular exercise. This is one of the most important things you can do for your health. Most adults should: Exercise for at least 150 minutes each week. The exercise should increase your heart rate and make you sweat (moderate-intensity exercise). Do strengthening exercises at least twice a week. This is in addition to the moderate-intensity exercise. Spend less time sitting. Even light physical activity can be beneficial. Watch cholesterol and blood lipids Have your blood tested for lipids and cholesterol at 27 years of age, then havethis test every 5 years. You may need to have your cholesterol levels checked more often if: Your lipid or cholesterol levels are high. You are older than 27 years of age. You are at high risk for heart disease. What should I know about cancer screening? Many types of cancers can be detected early and may often be prevented. Depending on your health history and family history, you may need to have cancer screening at various ages. This may include screening for: Colorectal cancer. Prostate cancer. Skin cancer. Lung  cancer. What should I know about heart disease, diabetes, and high blood pressure? Blood pressure and heart disease High blood pressure causes heart disease and increases the risk of stroke. This is more likely to develop in people who have high blood pressure readings, are of African descent, or are overweight. Talk with your health care provider about your target blood pressure readings. Have your blood pressure checked: Every 3-5 years if you are 18-39 years of age. Every year if you are 40 years old or older. If you are between the ages of 65 and 75 and are a current or former smoker, ask your health care provider if you should have a one-time screening for abdominal aortic aneurysm (AAA). Diabetes Have regular diabetes screenings. This checks your fasting blood sugar level. Have the screening done: Once every three years after age 45 if you are at a normal weight and have a low risk for diabetes. More often and at a younger age if you are overweight or have a high risk for diabetes. What should I know about preventing infection? Hepatitis B If you have a higher risk for hepatitis B, you should be screened for this virus. Talk with your health care provider to find out if you are at risk forhepatitis B infection. Hepatitis C Blood testing is recommended for: Everyone born from 1945 through 1965. Anyone with known risk factors for hepatitis C. Sexually transmitted infections (STIs) You should be screened each year for STIs, including gonorrhea and chlamydia, if: You are sexually active and are younger than 27 years of age. You are older than 27 years of age   and your health care provider tells you that you are at risk for this type of infection. Your sexual activity has changed since you were last screened, and you are at increased risk for chlamydia or gonorrhea. Ask your health care provider if you are at risk. Ask your health care provider about whether you are at high risk for HIV.  Your health care provider may recommend a prescription medicine to help prevent HIV infection. If you choose to take medicine to prevent HIV, you should first get tested for HIV. You should then be tested every 3 months for as long as you are taking the medicine. Follow these instructions at home: Lifestyle Do not use any products that contain nicotine or tobacco, such as cigarettes, e-cigarettes, and chewing tobacco. If you need help quitting, ask your health care provider. Do not use street drugs. Do not share needles. Ask your health care provider for help if you need support or information about quitting drugs. Alcohol use Do not drink alcohol if your health care provider tells you not to drink. If you drink alcohol: Limit how much you have to 0-2 drinks a day. Be aware of how much alcohol is in your drink. In the [place] one drink equals one 12 oz bottle of beer (355 mL), one 5 oz glass of wine (148 mL), or one 1 oz glass of hard liquor (44 mL). General instructions Schedule regular health, dental, and eye exams. Stay current with your vaccines. Tell your health care provider if: You often feel depressed. You have ever been abused or do not feel safe at home. Summary Adopting a healthy lifestyle and getting preventive care are important in promoting health and wellness. Follow your health care provider's instructions about healthy diet, exercising, and getting tested or screened for diseases. Follow your health care provider's instructions on monitoring your cholesterol and blood pressure. This information is not intended to replace advice given to you by your health care provider. Make sure you discuss any questions you have with your healthcare provider. Document Revised: 07/23/2018 Document Reviewed: 07/23/2018 [employer]vier Patient Education  2022 [employer]  

## 2021-02-02 NOTE — Progress Notes (Signed)
Office Note 02/02/2021  CC:  Chief Complaint  Patient presents with   Annual Exam    Pt is fasting    HPI:  Michael Moon is a 25 y.o. White male who is here for annual health maintenance exam. Doing well, no complaints.  Exercise: "trying to". Diet: "60% healthy, 40% not so good".    Past Medical History:  Diagnosis Date   Anal fissure    Anemia    Crohn disease ([place]) dx 2012   Doing well on lialda and mercaptopurine as of 01/2018 (Dr. Carlean Purl). 03/2019 colonoscopy->mild active colitis. Recall 1 yr.   Fundic gland polyps of stomach, benign 10/2020   GERD (gastroesophageal reflux disease)    Hx of adenomatous colonic polyps 03/18/2019   Hyperlipemia, mixed 2019,2020   TLC   Obesity, Class I, BMI 30-34.9    Seasonal allergic rhinitis     Past Surgical History:  Procedure Laterality Date   COLONOSCOPY  2012;03/16/2019; 10/2020   2012 crohns. 03/2019->2 adenomas, mild active colitis, otherwise in remission. 10/2020-normal.   ESOPHAGOGASTRODUODENOSCOPY  2012   2012.  10/26/20->mild acute and chronic esophagitis (eosin neg)   TONSILLECTOMY AND ADENOIDECTOMY  08/13/2005    Family History  Problem Relation Age of Onset   Arthritis Mother    Hypertension Mother    Brain cancer Father    Hyperlipidemia Father    Hypertension Father    Barrett's esophagus Father        last EGD showed high-grade dysplasia   Arthritis Maternal Grandmother    Diabetes Maternal Grandmother    Hypertension Maternal Grandmother    Stroke Maternal Grandmother    Stroke Maternal Grandfather    Hearing loss Maternal Grandfather    Alcohol abuse Maternal Grandfather    Alcohol abuse Paternal Grandmother    Hypertension Paternal Grandmother    Alcohol abuse Paternal Grandfather    Early death Paternal Grandfather        unknown cause   Rectal cancer Neg Hx    Colon cancer Neg Hx    Esophageal cancer Neg Hx    Stomach cancer Neg Hx     Social History   Socioeconomic History   Marital  status: Single    Spouse name: Not on file   Number of children: 0   Years of education: Not on file   Highest education level: Not on file  Occupational History   Occupation: MRI Psychologist, clinical: [employer]  Tobacco Use   Smoking status: Never   Smokeless tobacco: Never  Vaping Use   Vaping Use: Never used  Substance and Sexual Activity   Alcohol use: No   Drug use: No   Sexual activity: Not on file  Other Topics Concern   Not on file  Social History Narrative   Single, no children.   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   No T/A/Ds.   Lives with mom in [place] Strain: Not on file  Food Insecurity: Not on file  Transportation Needs: Not on file  Physical Activity: Not on file  Stress: Not on file  Social Connections: Not on file  Intimate Partner Violence: Not on file    Outpatient Medications Prior to Visit  Medication Sig Dispense Refill   cetirizine (ZYRTEC) 10 MG tablet Take 10 mg by mouth daily.     mercaptopurine (PURINETHOL) 50 MG tablet Take 2-3 tablets by mouth daily  as directed. 270 tablet 0   mesalamine (LIALDA) 1.2 g EC tablet TAKE 4 TABLETS BY MOUTH DAILY WITH BREAKFAST. 360 tablet 3   omeprazole (PRILOSEC) 40 MG capsule TAKE 1 CAPSULE BY MOUTH DAILY. 90 capsule 3   pramoxine-hydrocortisone (PROCTOCREAM-HC) 1-1 % rectal cream Place 1 application rectally 2 (two) times daily as needed for hemorrhoids or anal itching. 30 g 1   No facility-administered medications prior to visit.    Allergies  Allergen Reactions   Codeine Rash    ROS Review of Systems  Constitutional:  Negative for appetite change, chills, fatigue and fever.  HENT:  Negative for congestion, dental problem, ear pain and sore throat.   Eyes:  Negative for discharge, redness and visual disturbance.  Respiratory:  Negative for cough, chest tightness, shortness of breath and wheezing.    Cardiovascular:  Negative for chest pain, palpitations and leg swelling.  Gastrointestinal:  Negative for abdominal pain, blood in stool, diarrhea, nausea and vomiting.  Genitourinary:  Negative for difficulty urinating, dysuria, flank pain, frequency, hematuria and urgency.  Musculoskeletal:  Negative for arthralgias, back pain, joint swelling, myalgias and neck stiffness.  Skin:  Negative for pallor and rash.  Neurological:  Negative for dizziness, speech difficulty, weakness and headaches.  Hematological:  Negative for adenopathy. Does not bruise/bleed easily.  Psychiatric/Behavioral:  Negative for confusion and sleep disturbance. The patient is not nervous/anxious.    PE; Vitals with BMI 02/02/2021 10/26/2020 10/26/2020  Height 6\' 1"  - -  Weight 268 lbs 10 oz - -  BMI 29.79 - -  Systolic 892 119 417  Diastolic 71 89 91  Pulse 80 76 86   Gen: Alert, well appearing.  Patient is oriented to person, place, time, and situation. AFFECT: pleasant, lucid thought and speech. ENT: Ears: EACs clear, normal epithelium.  TMs with good light reflex and landmarks bilaterally.  Eyes: no injection, icteris, swelling, or exudate.  EOMI, PERRLA. Nose: no drainage or turbinate edema/swelling.  No injection or focal lesion.  Mouth: lips without lesion/swelling.  Oral mucosa pink and moist.  Dentition intact and without obvious caries or gingival swelling.  Oropharynx without erythema, exudate, or swelling.  Neck: supple/nontender.  No LAD, mass, or TM.  Carotid pulses 2+ bilaterally, without bruits. CV: RRR, no m/r/g.   LUNGS: CTA bilat, nonlabored resps, good aeration in all lung fields. ABD: soft, NT, ND, BS normal.  No hepatospenomegaly or mass.  No bruits. EXT: no clubbing, cyanosis, or edema.  Musculoskeletal: no joint swelling, erythema, warmth, or tenderness.  ROM of all joints intact. Skin - no sores or suspicious lesions or rashes or color changes  Pertinent labs:  Lab Results  Component  Value Date   TSH 0.67 02/01/2020   Lab Results  Component Value Date   WBC 6.9 09/13/2020   HGB 14.3 09/13/2020   HCT 41.7 09/13/2020   MCV 88.2 09/13/2020   PLT 394.0 09/13/2020   Lab Results  Component Value Date   CREATININE 0.82 09/13/2020   BUN 13 09/13/2020   NA 137 09/13/2020   K 4.0 09/13/2020   CL 102 09/13/2020   CO2 27 09/13/2020   Lab Results  Component Value Date   ALT 34 09/13/2020   AST 20 09/13/2020   ALKPHOS 64 09/13/2020   BILITOT 0.4 09/13/2020   Lab Results  Component Value Date   CHOL 202 (H) 02/01/2020   Lab Results  Component Value Date   HDL 40.30 02/01/2020   Lab Results  Component Value Date  [place] 127 (H) 02/01/2020   Lab Results  Component Value Date   TRIG 176.0 (H) 02/01/2020   Lab Results  Component Value Date   CHOLHDL 5 02/01/2020    ASSESSMENT AND PLAN:   Health maintenance exam: Reviewed age and gender appropriate health maintenance issues (prudent diet, regular exercise, health risks of tobacco and excessive alcohol, use of seatbelts, fire alarms in home, use of sunscreen).  Also reviewed age and gender appropriate health screening as well as vaccine recommendations. Vaccines: ALL UTD. Labs: fasting HP labs ordered. Forward results to GI.  Longterm use of immunosuppressant agent (crohn's dz treatment) --mercaptopurine-->refer to dermatology for skin ca screening.  An After Visit Summary was printed and given to the patient.  FOLLOW UP:  Return in about 1 year (around 02/02/2022) for annual CPE (fasting).  Signed:  Crissie Sickles, MD           02/02/2021

## 2021-03-13 DIAGNOSIS — R9089 Other abnormal findings on diagnostic imaging of central nervous system: Secondary | ICD-10-CM

## 2021-03-13 HISTORY — DX: Other abnormal findings on diagnostic imaging of central nervous system: R90.89

## 2021-03-23 ENCOUNTER — Other Ambulatory Visit: Payer: Self-pay

## 2021-03-23 ENCOUNTER — Ambulatory Visit (INDEPENDENT_AMBULATORY_CARE_PROVIDER_SITE_OTHER): Payer: No Typology Code available for payment source | Admitting: Family Medicine

## 2021-03-23 ENCOUNTER — Encounter: Payer: Self-pay | Admitting: Family Medicine

## 2021-03-23 VITALS — BP 122/83 | HR 90 | Temp 98.5°F | Resp 16 | Ht 73.0 in | Wt 273.0 lb

## 2021-03-23 DIAGNOSIS — W57XXXA Bitten or stung by nonvenomous insect and other nonvenomous arthropods, initial encounter: Secondary | ICD-10-CM | POA: Diagnosis not present

## 2021-03-23 DIAGNOSIS — G379 Demyelinating disease of central nervous system, unspecified: Secondary | ICD-10-CM | POA: Diagnosis not present

## 2021-03-23 DIAGNOSIS — R202 Paresthesia of skin: Secondary | ICD-10-CM

## 2021-03-23 DIAGNOSIS — R200 Anesthesia of skin: Secondary | ICD-10-CM | POA: Diagnosis not present

## 2021-03-23 NOTE — Addendum Note (Signed)
Addended by: Tammi Sou on: 03/23/2021 03:08 PM   Modules accepted: Orders

## 2021-03-23 NOTE — Addendum Note (Signed)
Addended by: Kavin Leech on: 03/23/2021 03:13 PM   Modules accepted: Orders

## 2021-03-23 NOTE — Progress Notes (Signed)
OFFICE VISIT  03/23/2021  CC:  Chief Complaint  Patient presents with   Facial numbness    Started last Thursday; comes and goes only occurring on the L side.   HPI:    Patient is a 25 y.o. Caucasian male who presents for facial numbness. Onset 7 d/a, L side of tongue felt numb, resolved in 5 min. Facial numbness occurred next day and since then facial numbness has come and gone. Involves entire L side of face but sometimes more intense on one part.  Feels kind of like when he has received a shot of novocaine at dentist in the past. No weakness or drooping.  No balance issues or tremors.  No taste, smell, hearing, or vision abnormalities.  No cognitive, awareness, or memory problems. L hand 3rd, 4th, 5th fingers a little tingly one day in the last week, unclear how long. No HAs or vision complaints. He does recall a tick bite in the last month.  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 A radiologist there put him in MR machine and apparently noted periventricular white matter changes possibly c/w demyelination/MS.  FH: Maternal GF: question of MS Question [place]--in uncle.  ROS as above, plus--> no fevers, no CP, no SOB, no wheezing, no cough, no dizziness, no HAs, no rashes, no melena/hematochezia.  No polyuria or polydipsia.  No myalgias or arthralgias.  No focal weakness, paresthesias, or tremors.  No acute vision or hearing abnormalities.  No dysuria or unusual/new urinary urgency or frequency.  No recent changes in lower legs. No n/v/d or abd pain.  No palpitations.     Past Medical History:  Diagnosis Date   Anal fissure    Anemia    Crohn disease ([place]) dx 2012   Doing well on lialda and mercaptopurine as of 01/2018 (Dr. Carlean Purl). 03/2019 colonoscopy->mild active colitis. Recall 1 yr.   Fundic gland polyps of stomach, benign 10/2020   GERD (gastroesophageal reflux disease)    Hx of adenomatous colonic polyps 03/18/2019   Hyperlipemia, mixed 2019,2020   TLC    Obesity, Class I, BMI 30-34.9    Seasonal allergic rhinitis     Past Surgical History:  Procedure Laterality Date   COLONOSCOPY  2012;03/16/2019; 10/2020   2012 crohns. 03/2019->2 adenomas, mild active colitis, otherwise in remission. 10/2020-normal.   ESOPHAGOGASTRODUODENOSCOPY  2012   2012.  10/26/20->mild acute and chronic esophagitis (eosin neg)   TONSILLECTOMY AND ADENOIDECTOMY  08/13/2005    Outpatient Medications Prior to Visit  Medication Sig Dispense Refill   cetirizine (ZYRTEC) 10 MG tablet Take 10 mg by mouth daily.     mercaptopurine (PURINETHOL) 50 MG tablet Take 2-3 tablets by mouth daily as directed. 270 tablet 0   mesalamine (LIALDA) 1.2 g EC tablet TAKE 4 TABLETS BY MOUTH DAILY WITH BREAKFAST. 360 tablet 3   omeprazole (PRILOSEC) 40 MG capsule TAKE 1 CAPSULE BY MOUTH DAILY. 90 capsule 3   pramoxine-hydrocortisone (PROCTOCREAM-HC) 1-1 % rectal cream Place 1 application rectally 2 (two) times daily as needed for hemorrhoids or anal itching. 30 g 1   No facility-administered medications prior to visit.    Allergies  Allergen Reactions   Codeine Rash    ROS As per HPI  PE: Vitals with BMI 03/23/2021 02/02/2021 10/26/2020  Height '6\' 1"'  '6\' 1"'  -  Weight 273 lbs 268 lbs 10 oz -  BMI 01.65 53.74 -  Systolic 827 078 675  Diastolic 83 71 89  Pulse 90 80 76   Gen: Alert,  well appearing.  Patient is oriented to person, place, time, and situation. AFFECT: pleasant, lucid thought and speech. BWG:YKZL: no injection, icteris, swelling, or exudate.  EOMI, PERRLA. Mouth: lips without lesion/swelling.  Oral mucosa pink and moist. Oropharynx without erythema, exudate, or swelling.  CV: RRR, no m/r/g.   LUNGS: CTA bilat, nonlabored resps, good aeration in all lung fields. Neuro: CN 2-12 intact bilaterally, strength 5/5 in proximal and distal upper extremities and lower extremities bilaterally.  No sensory deficits.  No tremor.  No disdiadochokinesis.  No ataxia.  Upper extremity and  lower extremity DTRs symmetric.  No pronator drift.  LABS:  Lab Results  Component Value Date   TSH 0.79 02/02/2021   Lab Results  Component Value Date   WBC 7.2 02/02/2021   HGB 14.0 02/02/2021   HCT 41.3 02/02/2021   MCV 87.6 02/02/2021   PLT 390.0 02/02/2021   Lab Results  Component Value Date   CREATININE 0.88 02/02/2021   BUN 11 02/02/2021   NA 138 02/02/2021   K 4.0 02/02/2021   CL 100 02/02/2021   CO2 26 02/02/2021   Lab Results  Component Value Date   ALT 32 02/02/2021   AST 23 02/02/2021   ALKPHOS 68 02/02/2021   BILITOT 0.6 02/02/2021   Lab Results  Component Value Date   CHOL 215 (H) 02/02/2021   Lab Results  Component Value Date   HDL 41.80 02/02/2021   Lab Results  Component Value Date   LDLCALC 127 (H) 02/01/2020   Lab Results  Component Value Date   TRIG 247.0 (H) 02/02/2021   Lab Results  Component Value Date   CHOLHDL 5 02/02/2021   IMPRESSION AND PLAN:  Transient neurologic deficit: mainly L face numbness, but also L side of tongue numbness and L hand 3rd, 4th, and 5th digits numbness more transiently. "Unofficial" MR brain (diffusion wt'd) showed periventricular white matter changes suggestive of demyelinating dz such as MS. Will proceed with formal MR brain and C spine w/ and w/out contrast. Additionally, with hx of tick bite will eval for Lyme dz->Lyme ab with reflex WB. Will obtain cbc, cmet, esr, and crp today as well. Referral to neurologist ordered today--first available.  An After Visit Summary was printed and given to the patient.  FOLLOW UP: Return for to be determined based on imaging results and timing of neurologist visit.  Signed:  Crissie Sickles, MD           03/23/2021

## 2021-03-24 ENCOUNTER — Ambulatory Visit
Admission: RE | Admit: 2021-03-24 | Discharge: 2021-03-24 | Disposition: A | Discharge status: Home / Self Care | Payer: No Typology Code available for payment source | Source: Ambulatory Visit | Attending: Family Medicine | Admitting: Family Medicine

## 2021-03-24 ENCOUNTER — Ambulatory Visit: Payer: No Typology Code available for payment source

## 2021-03-24 DIAGNOSIS — R202 Paresthesia of skin: Secondary | ICD-10-CM | POA: Diagnosis present

## 2021-03-24 DIAGNOSIS — G379 Demyelinating disease of central nervous system, unspecified: Secondary | ICD-10-CM | POA: Insufficient documentation

## 2021-03-24 DIAGNOSIS — R200 Anesthesia of skin: Secondary | ICD-10-CM | POA: Insufficient documentation

## 2021-03-24 LAB — COMPREHENSIVE METABOLIC PANEL
ALT: 28 U/L (ref 0–53)
AST: 19 U/L (ref 0–37)
Albumin: 4.8 g/dL (ref 3.5–5.2)
Alkaline Phosphatase: 70 U/L (ref 39–117)
BUN: 16 mg/dL (ref 6–23)
CO2: 26 mEq/L (ref 19–32)
Calcium: 9.8 mg/dL (ref 8.4–10.5)
Chloride: 103 mEq/L (ref 96–112)
Creatinine, Ser: 0.9 mg/dL (ref 0.40–1.50)
GFR: 117.18 mL/min (ref >60.00)
Glucose, Bld: 94 mg/dL (ref 70–99)
Potassium: 4.4 mEq/L (ref 3.5–5.1)
Sodium: 142 mEq/L (ref 135–145)
Total Bilirubin: 0.5 mg/dL (ref 0.2–1.2)
Total Protein: 7.7 g/dL (ref 6.0–8.3)

## 2021-03-24 LAB — CBC WITH DIFFERENTIAL/PLATELET
Basophils Absolute: 0.1 10*3/uL (ref 0.0–0.1)
Basophils Relative: 0.9 % (ref 0.0–3.0)
Eosinophils Absolute: 0 10*3/uL (ref 0.0–0.7)
Eosinophils Relative: 0.2 % (ref 0.0–5.0)
HCT: 43.7 % (ref 39.0–52.0)
Hemoglobin: 14.1 g/dL (ref 13.0–17.0)
Lymphocytes Relative: 16.5 % (ref 12.0–46.0)
Lymphs Abs: 1.6 10*3/uL (ref 0.7–4.0)
MCHC: 32.2 g/dL (ref 30.0–36.0)
MCV: 89.7 fl (ref 78.0–100.0)
Monocytes Absolute: 0.6 10*3/uL (ref 0.1–1.0)
Monocytes Relative: 6 % (ref 3.0–12.0)
Neutro Abs: 7.4 10*3/uL (ref 1.4–7.7)
Neutrophils Relative %: 76.4 % (ref 43.0–77.0)
Platelets: 447 10*3/uL — ABNORMAL HIGH (ref 150.0–400.0)
RBC: 4.87 Mil/uL (ref 4.22–5.81)
RDW: 15 % (ref 11.5–15.5)
WBC: 9.7 10*3/uL (ref 4.0–10.5)

## 2021-03-24 LAB — C-REACTIVE PROTEIN: CRP: 1.3 mg/dL (ref 0.5–20.0)

## 2021-03-24 LAB — SEDIMENTATION RATE: Sed Rate: 42 mm/hr — ABNORMAL HIGH (ref 0–15)

## 2021-03-24 LAB — LYME DISEASE SEROLOGY W/REFLEX: Lyme Total Antibody EIA: NEGATIVE

## 2021-03-24 IMAGING — MR MR HEAD WO/W CM
18 series · 45 of 48 positions shown · IV contrast (gadavist)
Comparison: None.

CLINICAL DATA: Left-sided facial tingling and numbness for 1 week.
Tick bite in [REDACTED]. Cranial neuropathy.

EXAM:
MRI HEAD WITHOUT AND WITH CONTRAST
TECHNIQUE: Multiplanar, multiecho pulse sequences of the brain and surrounding
structures were obtained without and with intravenous contrast.
CONTRAST:  10mL GADAVIST GADOBUTROL 1 MMOL/ML IV SOLN

[Series 5: ax dwi_tracew · axial · 3.0mm · 0.65mm/px · z∈[-86,+67]mm · 3 of 48 slices shown]
[im 1/48]
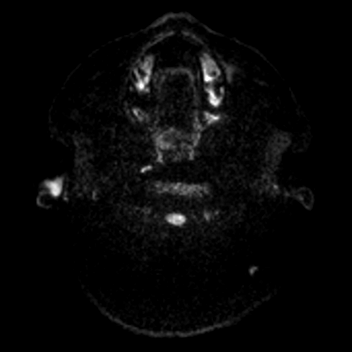
[im 24/48]
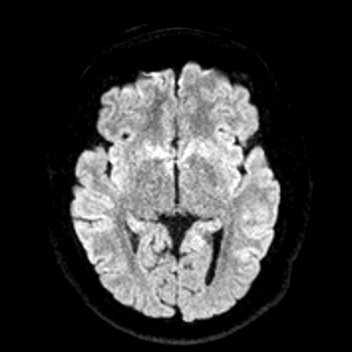
[im 48/48]
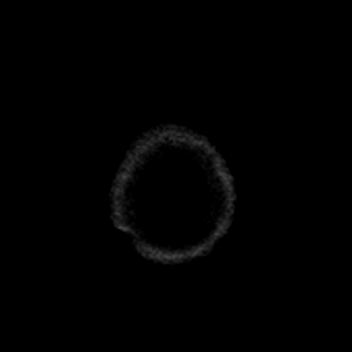

[Series 6: ax dwi_adc · axial · 3.0mm · 0.65mm/px · z∈[-86,+67]mm · 3 of 48 slices shown]
[im 1/48]
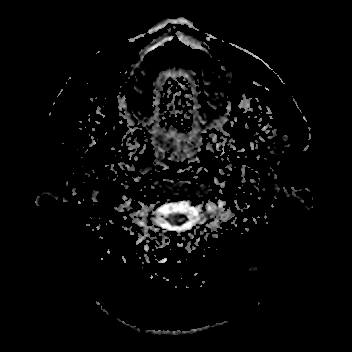
[im 24/48]
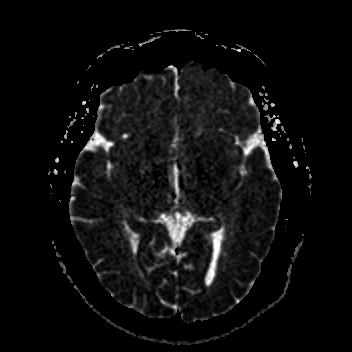
[im 48/48]
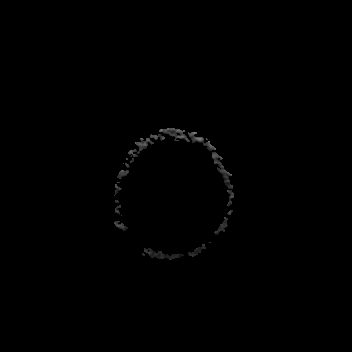

[Series 7: cor dwi_tracew · coronal · 5.0mm · 0.68mm/px · 2 of 40 slices shown]
[im 1/40]
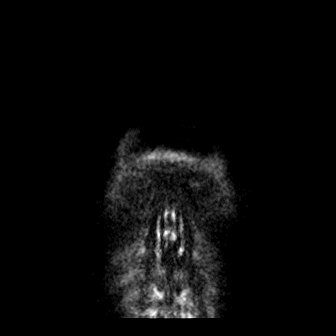
[im 40/40]
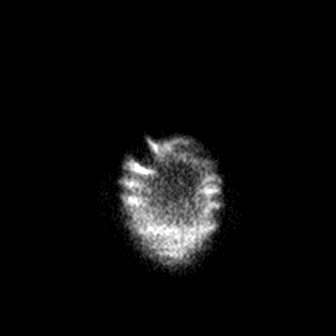

[Series 8: cor dwi_adc · coronal · 5.0mm · 0.68mm/px · 2 of 40 slices shown]
[im 1/40]
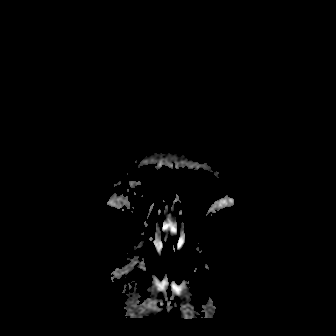
[im 40/40]
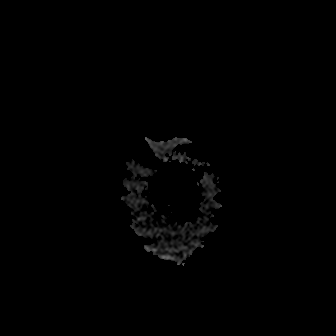

[Series 9: double inversion recovery · sagittal · 1.5mm · 1.46mm/px · 5 of 104 slices shown]
[im 1/104]
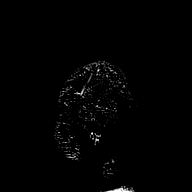
[im 26/104]
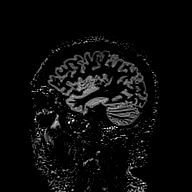
[im 52/104]
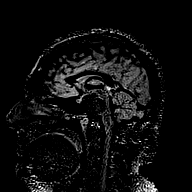
[im 78/104]
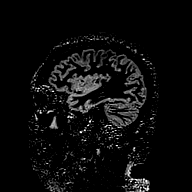
[im 104/104]
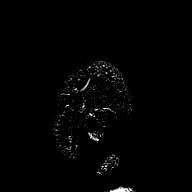

[Series 10: T1 · sagittal · 5.0mm · 0.62mm/px · 1 of 23 slices shown (1 of 2)]
[im 1/23]
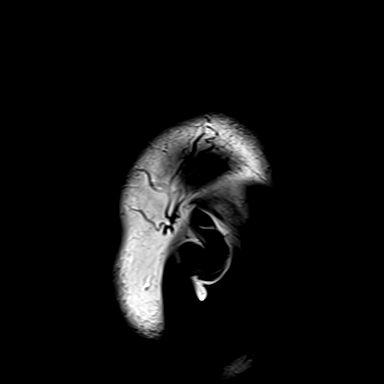

[Series 11: FLAIR · sagittal · 5.0mm · 0.94mm/px · 1 of 23 slices shown (1 of 2)]
[im 1/23]
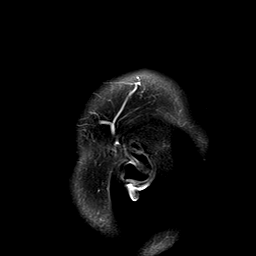

[Series 12: T2 · axial · 5.0mm · 0.53mm/px · 1 of 26 slices shown (1 of 2)]
[im 1/26]
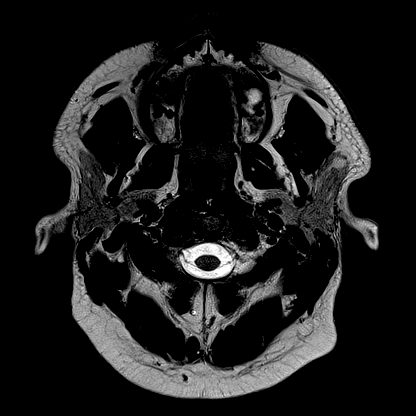

[Series 13: mag_images · axial · 3.0mm · 0.90mm/px · z∈[-85,+66]mm · 2 of 52 slices shown]
[im 1/52]
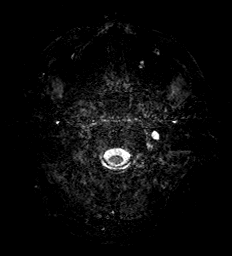
[im 52/52]
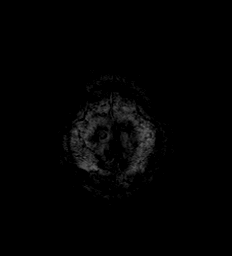

[Series 14: pha_images · axial · 3.0mm · 0.90mm/px · z∈[-85,+66]mm · 2 of 52 slices shown]
[im 1/52]
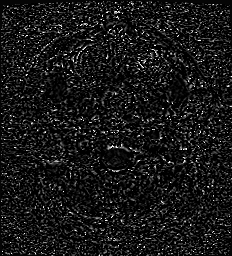
[im 52/52]
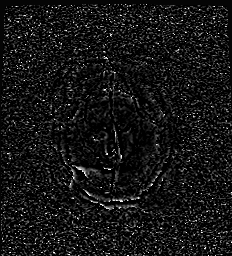

[Series 15: swi_images · axial · 3.0mm · 0.90mm/px · z∈[-85,+66]mm · 2 of 52 slices shown]
[im 1/52]
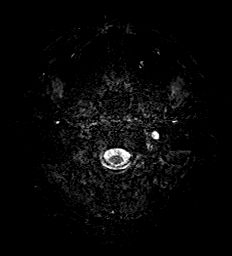
[im 52/52]
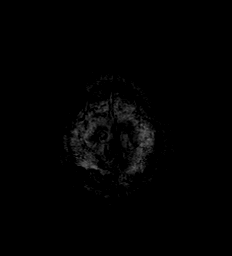

[Series 17: FLAIR · axial · 3.0mm · 0.53mm/px · z∈[-88,+72]mm · 2 of 55 slices shown (2 of 2)]
[im 1/55]
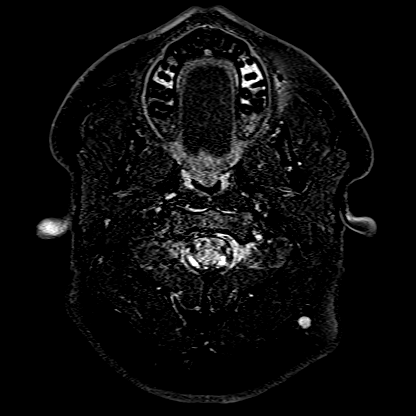
[im 55/55]
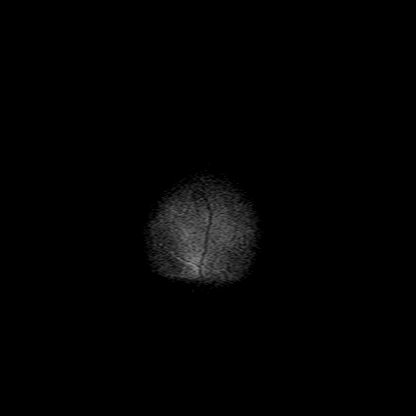

[Series 18: T1 · axial · 1.0mm · 0.98mm/px · z∈[-89,+67]mm · 7 of 160 slices shown (2 of 2)]
[im 1/160]
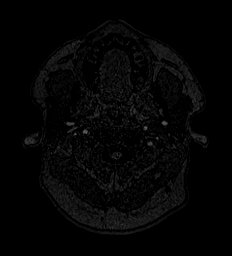
[im 27/160]
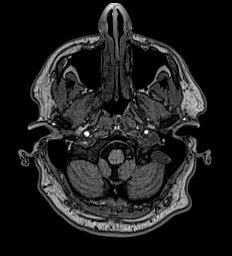
[im 54/160]
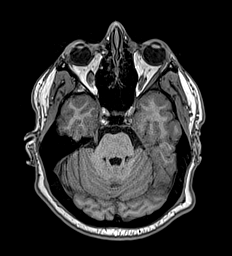
[im 80/160]
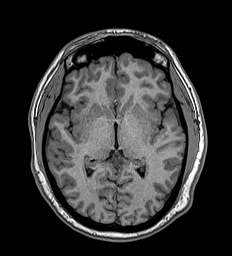
[im 107/160]
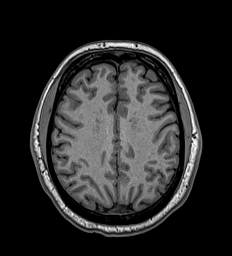
[im 133/160]
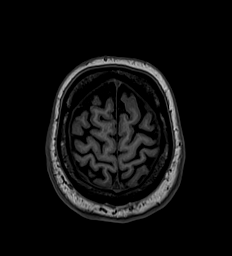
[im 160/160]
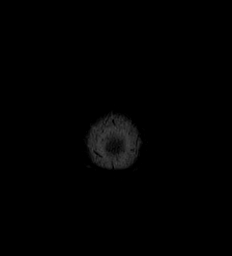

[Series 19: T2 · coronal · 5.0mm · 0.57mm/px · 1 of 29 slices shown (2 of 2)]
[im 1/29]
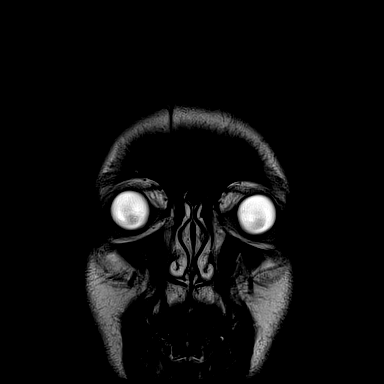

[Series 20: T1 post-contrast · axial · 1.0mm · 0.98mm/px · z∈[-89,+67]mm · 7 of 160 slices shown (1 of 3)]
[im 1/160]
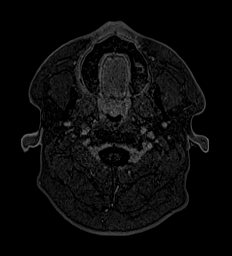
[im 27/160]
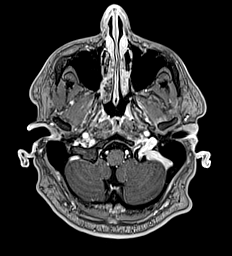
[im 54/160]
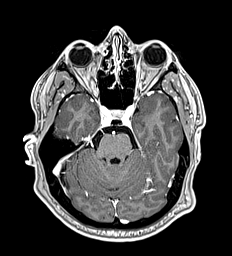
[im 80/160]
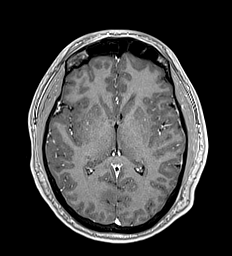
[im 107/160]
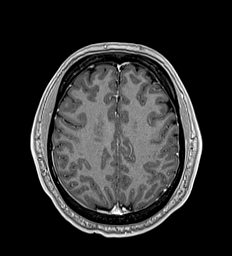
[im 133/160]
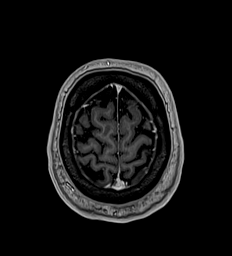
[im 160/160]
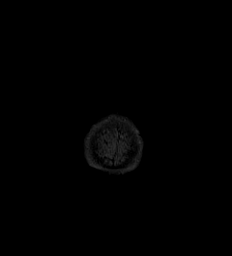

[Series 21: T1 post-contrast · coronal · 5.0mm · 0.57mm/px · 1 of 29 slices shown (2 of 3)]
[im 1/29]
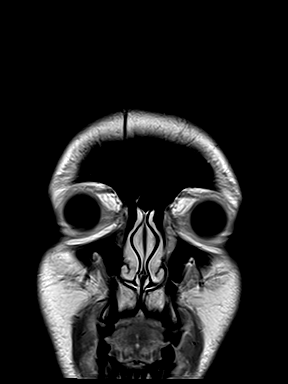

[Series 22: T1 post-contrast · sagittal · 5.0mm · 0.62mm/px · 1 of 23 slices shown (3 of 3)]
[im 1/23]
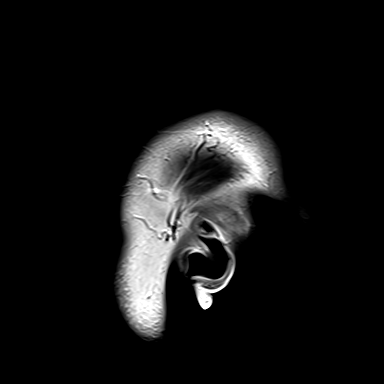

[Series 1039: dir cor · coronal · 1.5mm · 0.32mm/px · 2 of 123 slices shown]
[im 1/123]
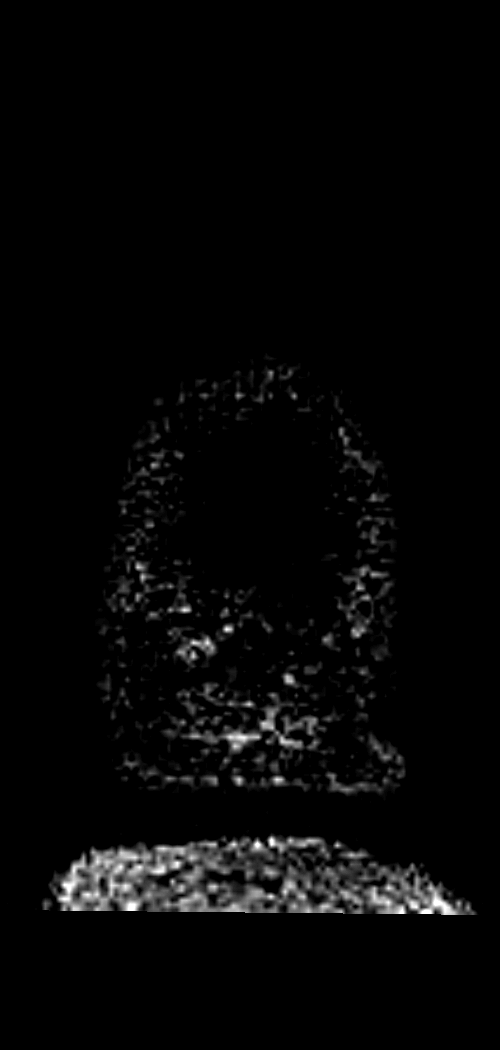
[im 31/123]
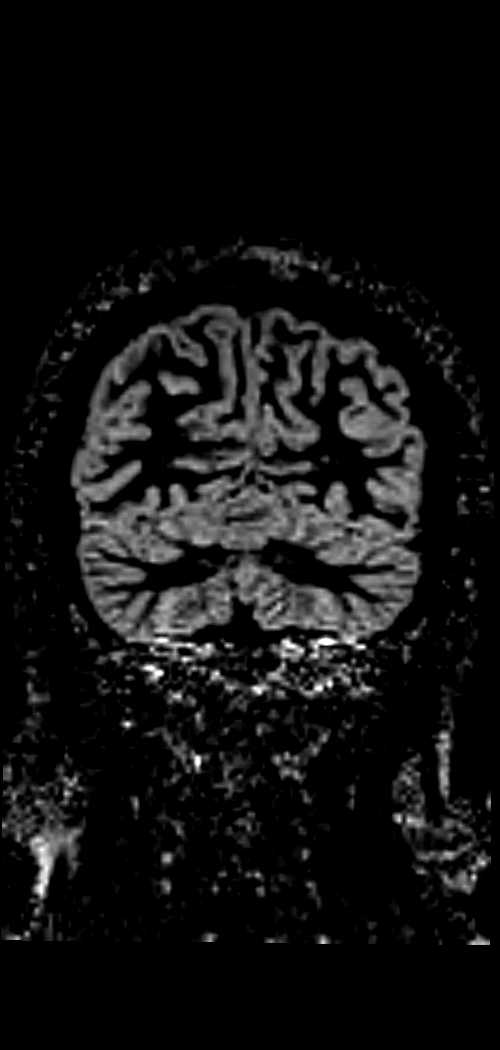

[45 of 48 positions shown; findings below may reference images not displayed]

FINDINGS: Brain: Numerous periventricular FLAIR hyperintensities about the
lateral ventricles, some ovoid and radiating morphology. None of
these are enhancing. Lesion such as this can be seen with
demyelinating disease, premature chronic small vessel ischemia, or
other inflammatory process. Given patient's young age and history of
tick bite, demyelinating disease is a leading consideration. There
is reported history of CADASIL in the family and small-vessel
disease is also a strong consideration. No anterior temporal white
matter involvement. No juxta cortical or infratentorial involvement
to increase chances of multiple sclerosis. Brain volume is normal.
No infarct, hemorrhage, hydrocephalus, or collection.

Vascular: Normal flow voids and vascular enhancements

Skull and upper cervical spine: Normal marrow signal

Sinuses/Orbits: Negative
IMPRESSION: Multiple white matter signal abnormalities around the lateral
ventricles, primarily concerning for demyelinating disease in this
setting, although nonspecific and discussed above. No enhancement or
restricted diffusion typical of acute/active disease.

## 2021-03-24 IMAGING — MR MR CERVICAL SPINE WO/W CM
5 of 9 series · 28 of 48 positions shown · IV contrast (gadavist)
Comparison: None.

CLINICAL DATA: Demyelinating disease.

EXAM:
MRI CERVICAL SPINE WITHOUT AND WITH CONTRAST
TECHNIQUE: Multiplanar and multiecho pulse sequences of the cervical spine, to
include the craniocervical junction and cervicothoracic junction,
were obtained without and with intravenous contrast.
CONTRAST:  10mL GADAVIST GADOBUTROL 1 MMOL/ML IV SOLN

[Series 5: T2 · sagittal · 3.0mm · 0.62mm/px · 3 of 15 slices shown (1 of 2)]
[im 1/15]
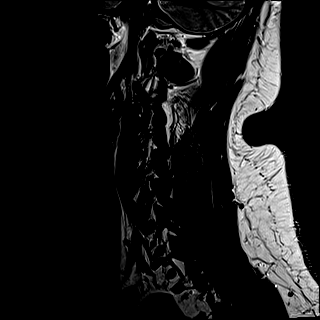
[im 8/15]
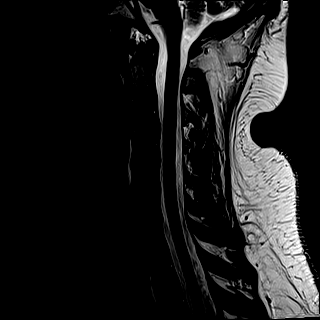
[im 15/15]
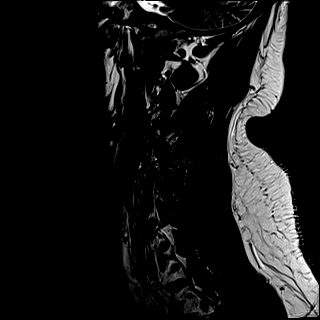

[Series 7: STIR · sagittal · 3.0mm · 0.62mm/px · 3 of 15 slices shown]
[im 1/15]
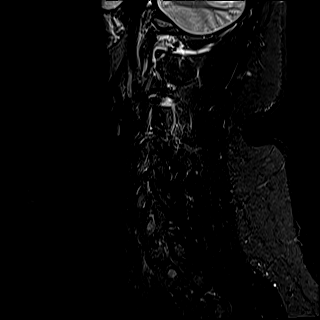
[im 8/15]
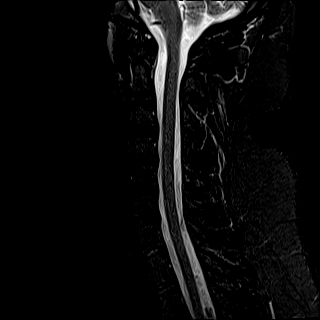
[im 15/15]
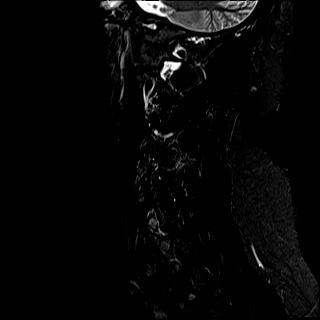

[Series 9: T2 · axial · 3.0mm · 0.70mm/px · z∈[-205,-100]mm · 8 of 32 slices shown (2 of 2)]
[im 1/32]
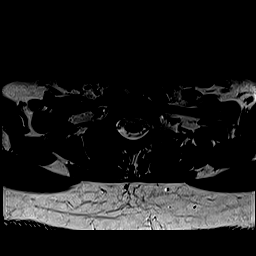
[im 5/32]
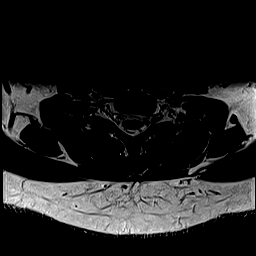
[im 9/32]
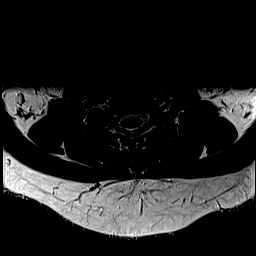
[im 14/32]
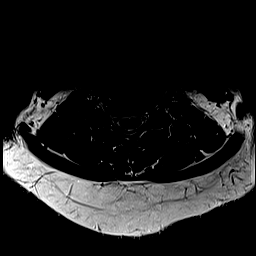
[im 18/32]
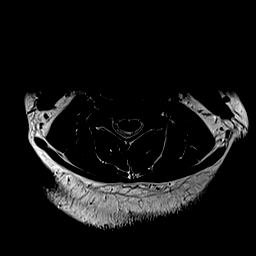
[im 23/32]
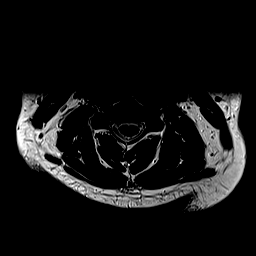
[im 27/32]
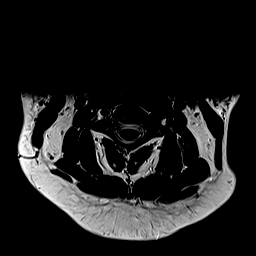
[im 32/32]
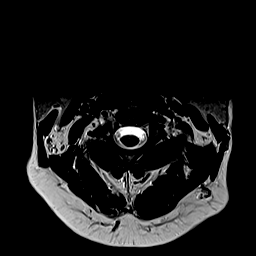

[Series 11: T1 · axial · non-contrast · 3.0mm · 0.35mm/px · z∈[-205,-100]mm · 8 of 32 slices shown]
[im 1/32]
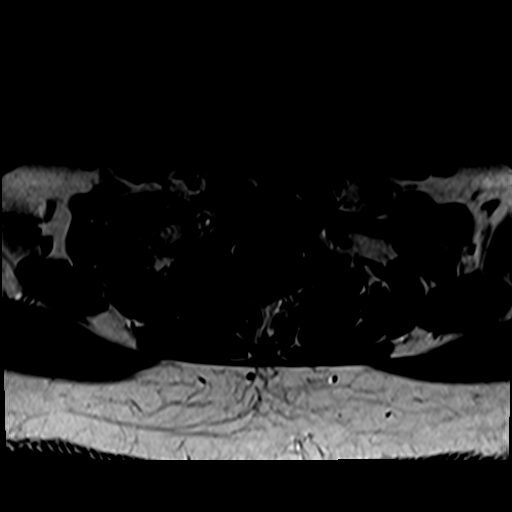
[im 5/32]
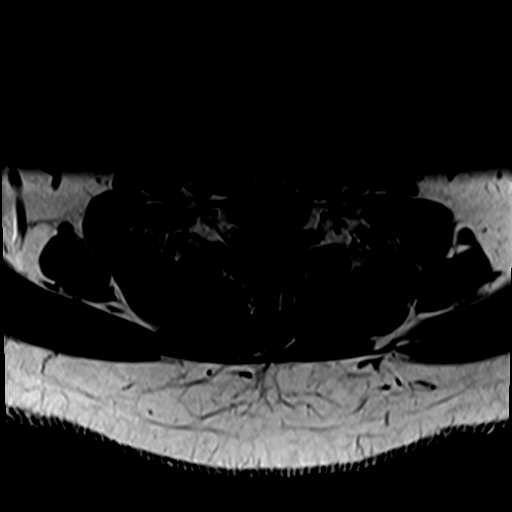
[im 9/32]
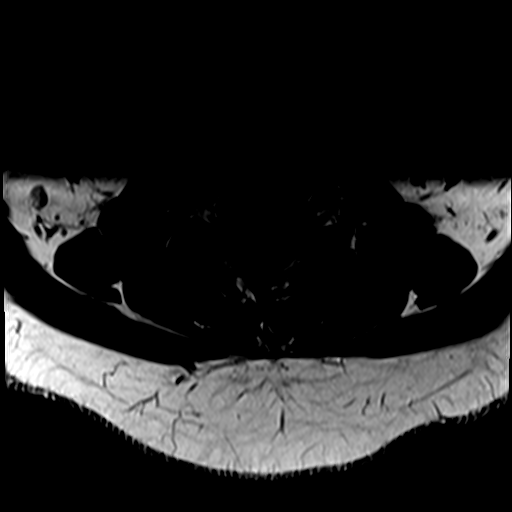
[im 14/32]
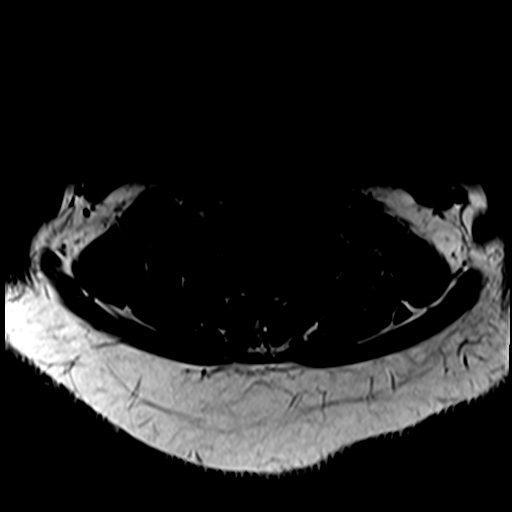
[im 18/32]
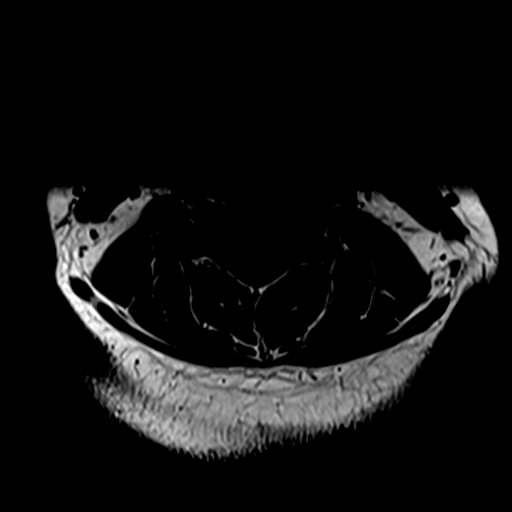
[im 23/32]
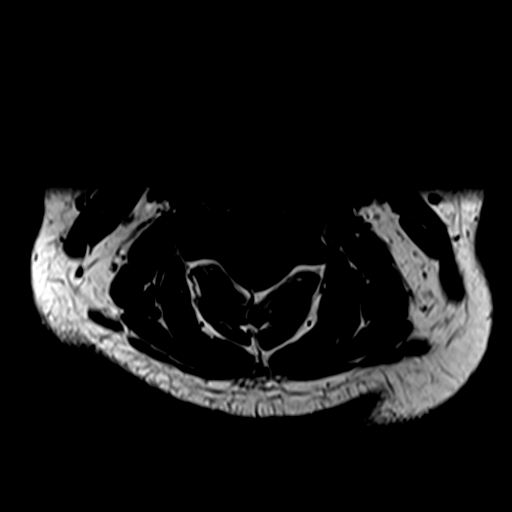
[im 27/32]
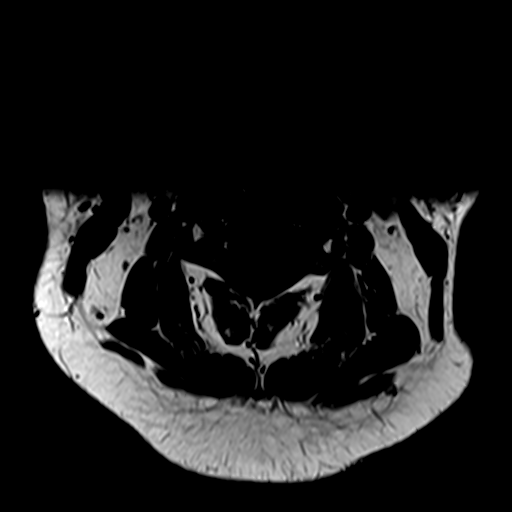
[im 32/32]
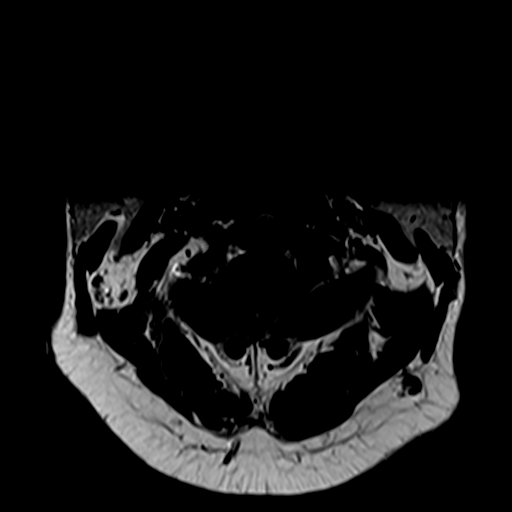

[Series 13: T1 post-contrast · axial · 3.0mm · 0.35mm/px · z∈[-205,-131]mm · 6 of 32 slices shown]
[im 1/32]
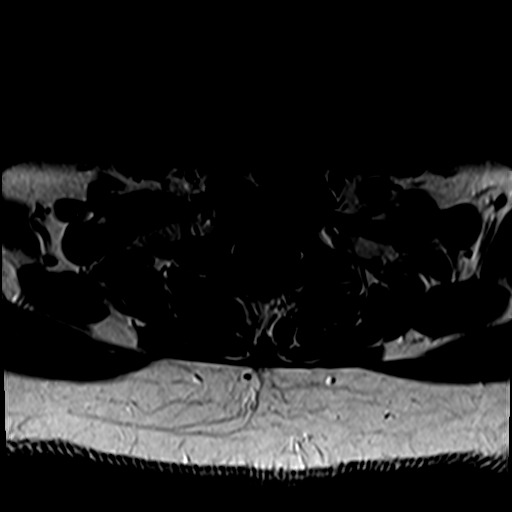
[im 5/32]
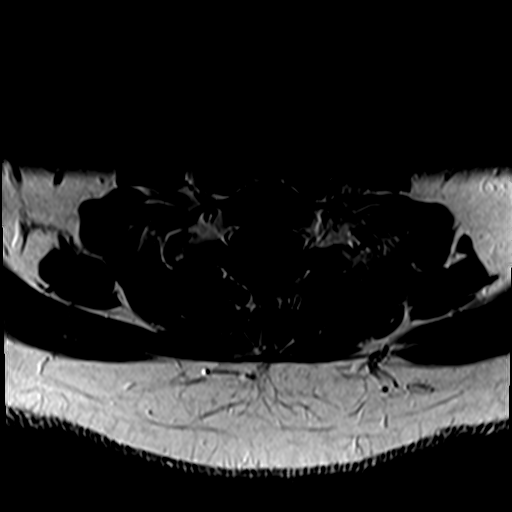
[im 9/32]
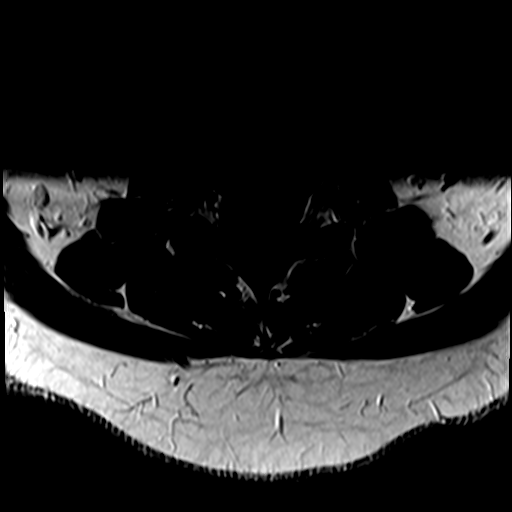
[im 14/32]
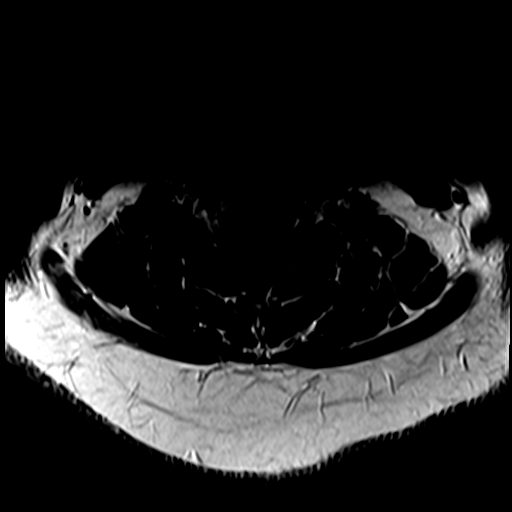
[im 18/32]
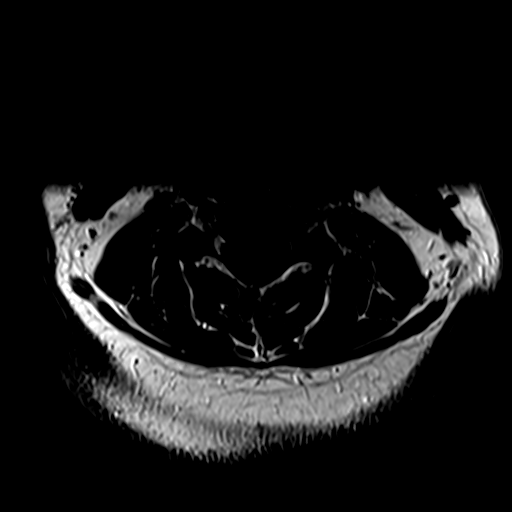
[im 23/32]
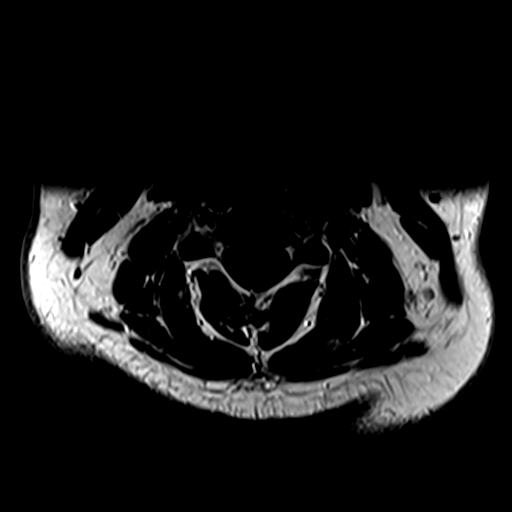

[28 of 48 positions shown; findings below may reference images not displayed]

FINDINGS: Alignment: Normal

Vertebrae: No fracture, evidence of discitis, or bone lesion.

Cord: Normal signal and morphology.

Posterior Fossa, vertebral arteries, paraspinal tissues: Negative.

Disc levels:

No significant degenerative changes or neural impingement.
IMPRESSION: Normal MRI of the cervical cord.

## 2021-03-24 MED ORDER — GADOBUTROL 1 MMOL/ML IV SOLN
10.0000 mL | Freq: Once | INTRAVENOUS | Status: AC | PRN
  Administered 2021-03-24: 10 mL via INTRAVENOUS

## 2021-03-27 ENCOUNTER — Ambulatory Visit: Payer: No Typology Code available for payment source

## 2021-03-29 ENCOUNTER — Encounter: Payer: Self-pay | Admitting: Family Medicine

## 2021-03-31 DIAGNOSIS — R200 Anesthesia of skin: Secondary | ICD-10-CM | POA: Insufficient documentation

## 2021-03-31 DIAGNOSIS — R202 Paresthesia of skin: Secondary | ICD-10-CM | POA: Insufficient documentation

## 2021-04-02 ENCOUNTER — Other Ambulatory Visit: Payer: Self-pay | Admitting: Internal Medicine

## 2021-04-03 ENCOUNTER — Other Ambulatory Visit: Payer: Self-pay

## 2021-04-03 MED ORDER — MERCAPTOPURINE 50 MG PO TABS
ORAL_TABLET | ORAL | 0 refills | Status: DC
  Filled 2021-04-03: qty 270, 90d supply, fill #0

## 2021-04-03 MED ORDER — OMEPRAZOLE 40 MG PO CPDR
DELAYED_RELEASE_CAPSULE | Freq: Every day | ORAL | 3 refills | Status: DC
  Filled 2021-04-03: qty 90, 90d supply, fill #0
  Filled 2021-07-10: qty 90, 90d supply, fill #1
  Filled 2021-10-15: qty 90, 90d supply, fill #2
  Filled 2022-01-09: qty 90, 90d supply, fill #3

## 2021-04-03 MED ORDER — MESALAMINE 1.2 G PO TBEC
DELAYED_RELEASE_TABLET | ORAL | 3 refills | Status: DC
Start: 2021-04-03 — End: 2022-04-19
  Filled 2021-04-03: qty 360, 90d supply, fill #0
  Filled 2021-07-10: qty 360, 90d supply, fill #1
  Filled 2021-10-15: qty 360, 90d supply, fill #2
  Filled 2022-01-09: qty 360, 90d supply, fill #3

## 2021-04-04 ENCOUNTER — Other Ambulatory Visit: Payer: Self-pay

## 2021-04-07 ENCOUNTER — Other Ambulatory Visit: Payer: Self-pay | Admitting: Neurology

## 2021-04-07 DIAGNOSIS — G35 Multiple sclerosis: Secondary | ICD-10-CM

## 2021-04-14 ENCOUNTER — Encounter: Payer: Self-pay | Admitting: Family Medicine

## 2021-04-19 ENCOUNTER — Other Ambulatory Visit: Payer: Self-pay

## 2021-04-19 ENCOUNTER — Other Ambulatory Visit: Payer: Self-pay | Admitting: Radiology

## 2021-04-19 ENCOUNTER — Ambulatory Visit (HOSPITAL_COMMUNITY)
Admission: RE | Admit: 2021-04-19 | Discharge: 2021-04-19 | Disposition: A | Discharge status: Home / Self Care | Payer: No Typology Code available for payment source | Source: Ambulatory Visit | Attending: Neurology | Admitting: Neurology

## 2021-04-19 DIAGNOSIS — Z79899 Other long term (current) drug therapy: Secondary | ICD-10-CM

## 2021-04-19 DIAGNOSIS — G35 Multiple sclerosis: Secondary | ICD-10-CM | POA: Insufficient documentation

## 2021-04-19 DIAGNOSIS — Z7960 Long term (current) use of unspecified immunomodulators and immunosuppressants: Secondary | ICD-10-CM

## 2021-04-19 LAB — CSF CELL COUNT WITH DIFFERENTIAL
RBC Count, CSF: 3000 /mm3 — ABNORMAL HIGH
Tube #: 3
WBC, CSF: 0 /mm3 (ref 0–5)

## 2021-04-19 LAB — GLUCOSE, CSF: Glucose, CSF: 54 mg/dL (ref 40–70)

## 2021-04-19 LAB — PROTEIN, CSF: Total  Protein, CSF: 67 mg/dL — ABNORMAL HIGH (ref 15–45)

## 2021-04-21 ENCOUNTER — Ambulatory Visit: Payer: No Typology Code available for payment source

## 2021-04-21 LAB — IGG CSF INDEX
Albumin CSF-mCnc: 47 mg/dL — ABNORMAL HIGH (ref 10–45)
Albumin: 4.7 g/dL (ref 4.1–5.2)
CSF IgG Index: 0.6 (ref 0.0–0.7)
IgG (Immunoglobin G), Serum: 863 mg/dL (ref 603–1613)
IgG, CSF: 4.9 mg/dL (ref 0.0–10.3)
IgG/Alb Ratio, CSF: 0.1 (ref 0.00–0.25)

## 2021-04-22 LAB — CSF CULTURE W GRAM STAIN: Culture: NO GROWTH

## 2021-04-24 LAB — OLIGOCLONAL BANDS, CSF + SERM

## 2021-05-10 ENCOUNTER — Other Ambulatory Visit: Payer: Self-pay

## 2021-05-10 ENCOUNTER — Encounter: Payer: Self-pay | Admitting: Family Medicine

## 2021-05-10 DIAGNOSIS — U071 COVID-19: Secondary | ICD-10-CM

## 2021-05-10 HISTORY — DX: COVID-19: U07.1

## 2021-05-10 MED ORDER — NIRMATRELVIR/RITONAVIR (PAXLOVID)TABLET: 3.0000 | ORAL_TABLET | Freq: Two times a day (BID) | ORAL | 0 refills | Status: AC

## 2021-05-10 NOTE — Telephone Encounter (Signed)
He had positive covid test yesterday, completed by PCR. Symptoms started Monday,; sinus congestion, HA, fever, body aches, sore throat, loss of appetite. He started taking sudafed and tylenol yesterday.   Please review and advise

## 2021-05-10 NOTE — Telephone Encounter (Signed)
Spoke with patient regarding recommendations,voiced understanding. Pharmacy clarified and updated for paxlovid rx.

## 2021-05-10 NOTE — Telephone Encounter (Signed)
Patient is an employee of [employer] and states health at work told him to reach out to his pcp to see if we have any recommendations for medication or if we can prescribe any for him  Please advise

## 2021-05-10 NOTE — Telephone Encounter (Signed)
Pt fits criteria for high risk for complications from covid->suspected MS. He is w/in the treatment window for antiviral med. GFR 117. Will send in paxlovid now. Continue tylenol 1000mg  every 6 hours for fever/pain. Mucinex DM otc 1 tab q12h as needed for cough/congestion.  Britt: paxlovid order is pended.  Pls clarify pharmacy with pt->[facility] [place] or [facility]?

## 2021-06-16 ENCOUNTER — Ambulatory Visit: Payer: No Typology Code available for payment source | Admitting: Diagnostic Neuroimaging

## 2021-06-19 ENCOUNTER — Telehealth: Payer: Self-pay

## 2021-06-19 DIAGNOSIS — G35 Multiple sclerosis: Secondary | ICD-10-CM

## 2021-06-19 NOTE — Telephone Encounter (Signed)
Cindy from [facility] calling about referral.  Dr. Anitra Lauth referred patient to see Dr. Melrose Nakayama.  Dr. Melrose Nakayama would like patient to be seen by MS Specialist and referred him to [facility] doctor.  [facility] has declined referral, states [facility] is not in network for patient.  [facility] has referred back to PCP to enter referral to a MS Speciality provider with in [facility].  Please call Jenny Reichmann at [facility] for any questions/concerns [phone]

## 2021-06-19 NOTE — Telephone Encounter (Signed)
Please advise 

## 2021-06-19 NOTE — Telephone Encounter (Signed)
Pls call [facility] and [facility] and ask if they have any providers who see patients with multiple sclerosis. Let me know.

## 2021-06-20 NOTE — Telephone Encounter (Signed)
[facility], Metta Clines would be the best provider for this.  Urgent referral, he can get an appt within a couple of weeks otherwise an appt would be not be scheduled for 2-3 months.

## 2021-06-21 NOTE — Telephone Encounter (Signed)
OK, urgent referral has been ordered.

## 2021-06-21 NOTE — Telephone Encounter (Signed)
Noted  

## 2021-06-27 ENCOUNTER — Encounter: Payer: Self-pay | Admitting: Neurology

## 2021-07-10 ENCOUNTER — Other Ambulatory Visit: Payer: Self-pay

## 2021-07-11 ENCOUNTER — Other Ambulatory Visit: Payer: Self-pay

## 2021-07-11 NOTE — Progress Notes (Signed)
NEUROLOGY CONSULTATION NOTE  Michael Moon MRN: [id number] DOB: 18-Dec-1995  Referring provider: Shawnie Dapper, MD Primary care provider: Ricardo Jericho, MD  Reason for consult:  second opinion  Assessment/Plan:   Neurologic symptoms (left-sided hemisensory loss) with white matter abnormalities on brain MRI.  Suspicious for multiple sclerosis but other testing (such as MRI cervical spine and CSF analysis) were unremarkable, not supporting a diagnosis.  He reportedly has a family history of CADASIL but insurance will not cover the test.    I would like for patient to be evaluated by a neuro--immunologist.  I will refer him to Dr. Arlice Colt at [facility] In the meantime, I will order MRI of thoracic spine with and without contrast to evaluate for possible demyelinating lesion I will also check labs for possible vasculitis, pan-ANCA and Sjogren's antibodies   Subjective:  Michael Moon is a 25 year old right-handed male who presents for second opinion regarding diagnosis of multiple sclerosis.  History supplemented by his neurologist's notes.  MRI of brain and cervical spine personally reviewed.  He started having numbness and tingling on the left side of his tongue, which spread to include the entire left side of his face and subsequently involved the last 3 digits of his left hand and foot.  No pain or weakness, no visual disturbance, no balance disorders.  He had an MRI of the brain with and without contrast on 03/24/2021 which showed multiple nonenhancing white matter hyperintensities around the lateral ventricles.  MRI of cervical spine with and without contrast was normal.  Blood work from August included negative ANA, sed rate 42, CRP 1.3, negative Lyme, ACE 18, B12 440, and TSH 1.159.  Vit D level was low at 27.4.  He saw neurology, Dr. Gurney Maxin.  He underwent LP for CSF analysis which cell count 0, mildly elevated protein of 67, glucose 54, negative gram  stain and culture, normal IgG index 0.6 and no oligoclonal bands.  Due to family history of CADASIL, Dr. Melrose Nakayama wanted to check testing for CADASIL, but insurance denied it.  Symptoms lasted about 10 days.  He may still feel numbness in the fingers of his left hand once in a while.     Family history:  Maternal uncle - CADASIL, maternal grandfather - multiple sclerosis diagnosed in 40s but in hindsight, it has been proposed that he actually may have had CADASIL as well.    PAST MEDICAL HISTORY: Past Medical History:  Diagnosis Date   Abnormal brain MRI 03/2021   Multiple white matter signal abnormalities around the lateral ventricles->? MS, ? CADASIL-advanced blood testing and LP planned by neurology as of 03/2021, plus referral to [facility].   Anal fissure    Anemia    COVID-19 virus infection 05/10/2021   paxlovid   Crohn disease ([place]) dx 2012   Doing well on lialda and mercaptopurine as of 01/2018 (Dr. Carlean Purl). 03/2019 colonoscopy->mild active colitis. Recall 1 yr.   Fundic gland polyps of stomach, benign 10/2020   GERD (gastroesophageal reflux disease)    Hx of adenomatous colonic polyps 03/18/2019   Hyperlipemia, mixed 2019,2020   TLC   Obesity, Class I, BMI 30-34.9    Paresthesias    2022->L side of face, L hand and foot.  Lyme neg. See abnormal MRI in [place] section.   Seasonal allergic rhinitis     PAST SURGICAL HISTORY: Past Surgical History:  Procedure Laterality Date   COLONOSCOPY  2012;03/16/2019; 10/2020   2012 crohns.  03/2019->2 adenomas, mild active colitis, otherwise in remission. 10/2020-normal.   ESOPHAGOGASTRODUODENOSCOPY  2012   2012.  10/26/20->mild acute and chronic esophagitis (eosin neg)   TONSILLECTOMY AND ADENOIDECTOMY  08/13/2005    MEDICATIONS: Current Outpatient Medications on File Prior to Visit  Medication Sig Dispense Refill   acetaminophen (TYLENOL) 500 MG tablet Take 1,000 mg by mouth every 6 (six) hours as needed for moderate pain.     cetirizine  (ZYRTEC) 10 MG tablet Take 10 mg by mouth daily as needed for allergies.     famotidine (PEPCID) 10 MG tablet Take 10 mg by mouth daily as needed for heartburn or indigestion.     mercaptopurine (PURINETHOL) 50 MG tablet Take 2-3 tablets by mouth daily as directed. (Patient taking differently: Take 100 mg by mouth daily.) 270 tablet 0   mesalamine (LIALDA) 1.2 g EC tablet TAKE 4 TABLETS BY MOUTH DAILY WITH BREAKFAST. 360 tablet 3   omeprazole (PRILOSEC) 40 MG capsule TAKE 1 CAPSULE BY MOUTH DAILY. 90 capsule 3   No current facility-administered medications on file prior to visit.    ALLERGIES: Allergies  Allergen Reactions   Codeine Rash    FAMILY HISTORY: Family History  Problem Relation Age of Onset   Arthritis Mother    Hypertension Mother    Brain cancer Father    Hyperlipidemia Father    Hypertension Father    Barrett's esophagus Father        last EGD showed high-grade dysplasia   Arthritis Maternal Grandmother    Diabetes Maternal Grandmother    Hypertension Maternal Grandmother    Stroke Maternal Grandmother    Stroke Maternal Grandfather    Hearing loss Maternal Grandfather    Alcohol abuse Maternal Grandfather    Alcohol abuse Paternal Grandmother    Hypertension Paternal Grandmother    Alcohol abuse Paternal Grandfather    Early death Paternal Grandfather        unknown cause   Rectal cancer Neg Hx    Colon cancer Neg Hx    Esophageal cancer Neg Hx    Stomach cancer Neg Hx     Objective:  Blood pressure (!) 129/91, pulse 96, height 6' (1.829 m), weight 270 lb 6.4 oz (122.7 kg), SpO2 97 %. General: No acute distress.  Patient appears well-groomed.   Head:  Normocephalic/atraumatic Eyes:  fundi examined but not visualized Neck: supple, no paraspinal tenderness, full range of motion Back: No paraspinal tenderness Heart: regular rate and rhythm Lungs: Clear to auscultation bilaterally. Vascular: No carotid bruits. Neurological Exam: Mental status: alert  and oriented to person, place, and time, recent and remote memory intact, fund of knowledge intact, attention and concentration intact, speech fluent and not dysarthric, language intact. Cranial nerves: CN I: not tested CN II: pupils equal, round and reactive to light, visual fields intact CN III, IV, VI:  full range of motion, no nystagmus, no ptosis CN V: facial sensation intact. CN VII: upper and lower face symmetric CN VIII: hearing intact CN IX, X: gag intact, uvula midline CN XI: sternocleidomastoid and trapezius muscles intact CN XII: tongue midline Bulk & Tone: normal, no fasciculations. Motor:  muscle strength 5/5 throughout Sensation:  Pinprick, temperature and vibratory sensation intact. Deep Tendon Reflexes:  2+ throughout,  toes downgoing.   Finger to nose testing:  Without dysmetria.   Heel to shin:  Without dysmetria.   Gait:  Normal station and stride.  Romberg negative.    Thank you for allowing me to take part in  the care of this patient.  Metta Clines, DO  CC: Shawnie Dapper, MD

## 2021-07-12 ENCOUNTER — Ambulatory Visit (INDEPENDENT_AMBULATORY_CARE_PROVIDER_SITE_OTHER): Payer: No Typology Code available for payment source | Admitting: Neurology

## 2021-07-12 ENCOUNTER — Encounter: Payer: Self-pay | Admitting: Neurology

## 2021-07-12 ENCOUNTER — Other Ambulatory Visit: Payer: No Typology Code available for payment source

## 2021-07-12 ENCOUNTER — Other Ambulatory Visit: Payer: Self-pay

## 2021-07-12 VITALS — BP 129/91 | HR 96 | Ht 72.0 in | Wt 270.4 lb

## 2021-07-12 DIAGNOSIS — R9082 White matter disease, unspecified: Secondary | ICD-10-CM

## 2021-07-12 NOTE — Patient Instructions (Signed)
Will refer you to Dr. Arlice Colt at [facility]. In meantime, we will check MRI of thoracic spine with and without contrast We will also check ANCA panel and Sjogren's antibodies

## 2021-07-13 ENCOUNTER — Telehealth: Payer: Self-pay

## 2021-07-13 LAB — SJOGREN'S SYNDROME ANTIBODS(SSA + SSB)
SSA (Ro) (ENA) Antibody, IgG: <1 AI
SSB (La) (ENA) Antibody, IgG: <1 AI

## 2021-07-13 LAB — ANCA SCREEN W REFLEX TITER: ANCA Screen: NEGATIVE

## 2021-07-13 NOTE — Telephone Encounter (Signed)
Tired to contact patients insurance, unable to get thru. Will try again. I did reach out to patient to see if he could call as well for information regarding mri.

## 2021-07-14 ENCOUNTER — Telehealth: Payer: Self-pay | Admitting: Neurology

## 2021-07-14 ENCOUNTER — Encounter: Payer: Self-pay | Admitting: Internal Medicine

## 2021-07-14 NOTE — Telephone Encounter (Signed)
Melanie with [facility], she needs a call back regarding pt MRI [phone] ext [phone]

## 2021-07-14 NOTE — Telephone Encounter (Signed)
Per Threasa Beards, She tried calling the patient insurance no answer.  Spoke to [place] she was able to get through this morning.  Auth code added to referral and appt note.

## 2021-07-17 ENCOUNTER — Other Ambulatory Visit: Payer: Self-pay

## 2021-07-17 DIAGNOSIS — K5010 Crohn's disease of large intestine without complications: Secondary | ICD-10-CM

## 2021-07-17 DIAGNOSIS — Z7960 Long term (current) use of unspecified immunomodulators and immunosuppressants: Secondary | ICD-10-CM

## 2021-07-18 ENCOUNTER — Other Ambulatory Visit: Payer: Self-pay

## 2021-07-18 ENCOUNTER — Ambulatory Visit
Admission: RE | Admit: 2021-07-18 | Discharge: 2021-07-18 | Disposition: A | Discharge status: Home / Self Care | Payer: No Typology Code available for payment source | Source: Ambulatory Visit | Attending: Neurology | Admitting: Neurology

## 2021-07-18 DIAGNOSIS — R9082 White matter disease, unspecified: Secondary | ICD-10-CM | POA: Diagnosis present

## 2021-07-18 MED ORDER — GADOBUTROL 1 MMOL/ML IV SOLN
10.0000 mL | Freq: Once | INTRAVENOUS | Status: AC | PRN
  Administered 2021-07-18: 10 mL via INTRAVENOUS

## 2021-07-19 ENCOUNTER — Other Ambulatory Visit
Admission: RE | Admit: 2021-07-19 | Discharge: 2021-07-19 | Disposition: A | Discharge status: Home / Self Care | Payer: No Typology Code available for payment source | Attending: Internal Medicine | Admitting: Internal Medicine

## 2021-07-19 DIAGNOSIS — K5010 Crohn's disease of large intestine without complications: Secondary | ICD-10-CM | POA: Insufficient documentation

## 2021-07-19 DIAGNOSIS — Z7960 Long term (current) use of unspecified immunomodulators and immunosuppressants: Secondary | ICD-10-CM | POA: Diagnosis present

## 2021-07-19 LAB — CBC WITH DIFFERENTIAL/PLATELET
Abs Immature Granulocytes: 0.02 10*3/uL (ref 0.00–0.07)
Basophils Absolute: 0 10*3/uL (ref 0.0–0.1)
Basophils Relative: 1 %
Eosinophils Absolute: 0 10*3/uL (ref 0.0–0.5)
Eosinophils Relative: 1 %
HCT: 41.6 % (ref 39.0–52.0)
Hemoglobin: 14 g/dL (ref 13.0–17.0)
Immature Granulocytes: 0 %
Lymphocytes Relative: 26 %
Lymphs Abs: 1.7 10*3/uL (ref 0.7–4.0)
MCH: 29.5 pg (ref 26.0–34.0)
MCHC: 33.7 g/dL (ref 30.0–36.0)
MCV: 87.8 fL (ref 80.0–100.0)
Monocytes Absolute: 0.4 10*3/uL (ref 0.1–1.0)
Monocytes Relative: 6 %
Neutro Abs: 4.3 10*3/uL (ref 1.7–7.7)
Neutrophils Relative %: 66 %
Platelets: 373 10*3/uL (ref 150–400)
RBC: 4.74 MIL/uL (ref 4.22–5.81)
RDW: 14.2 % (ref 11.5–15.5)
WBC: 6.6 10*3/uL (ref 4.0–10.5)
nRBC: 0 % (ref 0.0–0.2)

## 2021-07-19 LAB — COMPREHENSIVE METABOLIC PANEL
ALT: 35 U/L (ref 0–44)
AST: 23 U/L (ref 15–41)
Albumin: 4.5 g/dL (ref 3.5–5.0)
Alkaline Phosphatase: 61 U/L (ref 38–126)
Anion gap: 7 (ref 5–15)
BUN: 15 mg/dL (ref 6–20)
CO2: 25 mmol/L (ref 22–32)
Calcium: 9.3 mg/dL (ref 8.9–10.3)
Chloride: 101 mmol/L (ref 98–111)
Creatinine, Ser: 0.76 mg/dL (ref 0.61–1.24)
GFR, Estimated: >60 mL/min (ref >60)
Glucose, Bld: 92 mg/dL (ref 70–99)
Potassium: 3.6 mmol/L (ref 3.5–5.1)
Sodium: 133 mmol/L — ABNORMAL LOW (ref 135–145)
Total Bilirubin: 0.7 mg/dL (ref 0.3–1.2)
Total Protein: 8 g/dL (ref 6.5–8.1)

## 2021-07-19 NOTE — Progress Notes (Signed)
Pt advised of his MRI results.

## 2021-07-24 ENCOUNTER — Encounter: Payer: Self-pay | Admitting: Neurology

## 2021-07-24 ENCOUNTER — Ambulatory Visit: Payer: No Typology Code available for payment source | Admitting: Neurology

## 2021-07-24 VITALS — BP 165/85 | HR 79 | Ht 72.0 in | Wt 272.0 lb

## 2021-07-24 DIAGNOSIS — R200 Anesthesia of skin: Secondary | ICD-10-CM

## 2021-07-24 DIAGNOSIS — Z8489 Family history of other specified conditions: Secondary | ICD-10-CM

## 2021-07-24 DIAGNOSIS — R9082 White matter disease, unspecified: Secondary | ICD-10-CM | POA: Diagnosis not present

## 2021-07-24 DIAGNOSIS — K5010 Crohn's disease of large intestine without complications: Secondary | ICD-10-CM

## 2021-07-24 NOTE — Progress Notes (Signed)
[facility]  PATIENT: Michael Moon DOB: 09-10-1995  REFERRING DOCTOR OR PCP: Michael Dapper MD SOURCE: Patient, notes from primary care, notes from Drs. Michael Moon (Neurology)  _________________________________   HISTORICAL  CHIEF COMPLAINT:  Chief Complaint  Patient presents with   New Patient (Initial Visit)    RM 16, alone. Internal referral from Michael Clines, MD for MS. MRI reports in Epic.  Per PCP notes: "He reportedly has a family history of CADASIL but insurance will not cover the test". States Maternal Uncle has this.  Reports tingling in left hand intermittently. Started back in August of this year. First time occurrence. Saw [facility]. [facility] in August who tried ordering Cadasil test but insurance denied even after appeal. Tried seeing [facility] but insurance denied (has [facility] plan).   vision    W/o correction: R: 20/50, L 20/50, both: 20/30. Has never had eye exam via eye doctor.    HISTORY OF PRESENT ILLNESS:  I had the pleasure of seeing patient, Michael Moon, at [facility] for neurologic consultation regarding his episode of numbness, abnormal brain MRI and family history of CADASIL      He is a 25 year old man who had an episode of left facial and hand numbness.   He had an MRI performed and a brain MRI was performed showing multiple non-enhancing lesions in his brain, mostly in the pericallosal white matter and in the deep white matter with small periatrial confluencies.    I showed him the images.  The foci are nonspecific.  There were only a couple of periventricular foci and no foci in the infratentorial white matter.  Therefore, the pattern would not be considered classic for MS.  There were no foci in the anterior temporal lobes or in the external capsules, hence the nonspecific foci would be atypical for CADASIL.    He has a FH of CADASIL (maternal uncle now deceased in an accident).   His mother has not  been tested and has no symptoms (no HA or memory issues).     He denies migraines and other headaches are rare.    He denies other neurologic symptoms besides the left sided numbness  He was referred to Dr. Melrose Moon in [place] who ordered an LP and C-spine MRI There were no OCB and normal IgG Index.   Protein was mildly elevated in the CSF.  Sed rate was mildly elevated at 42.  CRP was normal.  Lyme serology was normal.  Cervical spine shows minimal bulges and is otherwise normal.     He was referred to Dr. Tomi Moon who ordered a thoracic spine MRI, SSA/SSB and ANCA, all normal.    When his mother delivered him, she had a fever of 103 F.  He has no history of significant trauma.  He has Crohn's Disease and is on mesalamine and mercaptopurine and omeprazole.    Imaging: MRI of the brain 03/24/2021 shows multiple non-enhancing lesions in his brain, mostly in the pericallosal white matter and in the deep white matter with small periatrial confluencies.     The foci are nonspecific.  There were only a couple of periventricular foci and no foci in the infratentorial white matter. There were no foci in the anterior temporal lobes or in the external capsules.  Characteristics are not classic for MS or for CADASIL.  MRI of the cervical spine 03/24/2021 showed a normal spinal cord.  Had a couple of very small disc bulges that  would not be symptomatic  MRI of the thoracic spine 07/18/2021 was normal.  REVIEW OF SYSTEMS: Constitutional: No fevers, chills, sweats, or change in appetite Eyes: No visual changes, double vision, eye pain Ear, nose and throat: No hearing loss, ear pain, nasal congestion, sore throat Cardiovascular: No chest pain, palpitations Respiratory:  No shortness of breath at rest or with exertion.   No wheezes GastrointestinaI: He has Crohn's disease  genitourinary:  No dysuria, urinary retention or frequency.  No nocturia. Musculoskeletal:  No neck pain, back pain Integumentary: No rash,  pruritus, skin lesions Neurological: as above Psychiatric: No depression at this time.  No anxiety Endocrine: No palpitations, diaphoresis, change in appetite, change in weigh or increased thirst Hematologic/Lymphatic:  No anemia, purpura, petechiae. Allergic/Immunologic: No itchy/runny eyes, nasal congestion, recent allergic reactions, rashes  ALLERGIES: Allergies  Allergen Reactions   Codeine Rash    HOME MEDICATIONS:  Current Outpatient Medications:    acetaminophen (TYLENOL) 500 MG tablet, Take 1,000 mg by mouth every 6 (six) hours as needed for moderate pain., Disp: , Rfl:    cetirizine (ZYRTEC) 10 MG tablet, Take 10 mg by mouth daily as needed for allergies., Disp: , Rfl:    famotidine (PEPCID) 10 MG tablet, Take 10 mg by mouth daily as needed for heartburn or indigestion., Disp: , Rfl:    mercaptopurine (PURINETHOL) 50 MG tablet, Take 2-3 tablets by mouth daily as directed. (Patient taking differently: Take 100 mg by mouth daily.), Disp: 270 tablet, Rfl: 0   mesalamine (LIALDA) 1.2 g EC tablet, TAKE 4 TABLETS BY MOUTH DAILY WITH BREAKFAST., Disp: 360 tablet, Rfl: 3   omeprazole (PRILOSEC) 40 MG capsule, TAKE 1 CAPSULE BY MOUTH DAILY., Disp: 90 capsule, Rfl: 3  PAST MEDICAL HISTORY: Past Medical History:  Diagnosis Date   Abnormal brain MRI 03/2021   Multiple white matter signal abnormalities around the lateral ventricles->? MS, ? CADASIL-advanced blood testing and LP planned by neurology as of 03/2021, plus referral to [facility].   Anal fissure    Anemia    COVID-19 virus infection 05/10/2021   paxlovid   Crohn disease ([place]) dx 2012   Doing well on lialda and mercaptopurine as of 01/2018 (Michael Moon). 03/2019 colonoscopy->mild active colitis. Recall 1 yr.   Fundic gland polyps of stomach, benign 10/2020   GERD (gastroesophageal reflux disease)    Hx of adenomatous colonic polyps 03/18/2019   Hyperlipemia, mixed 2019,2020   TLC   Obesity, Class I, BMI 30-34.9     Paresthesias    2022->L side of face, L hand and foot.  Lyme neg. See abnormal MRI in [place] section.   Seasonal allergic rhinitis     PAST SURGICAL HISTORY: Past Surgical History:  Procedure Laterality Date   COLONOSCOPY  2012;03/16/2019; 10/2020   2012 crohns. 03/2019->2 adenomas, mild active colitis, otherwise in remission. 10/2020-normal.   ESOPHAGOGASTRODUODENOSCOPY  2012   2012.  10/26/20->mild acute and chronic esophagitis (eosin neg)   TONSILLECTOMY AND ADENOIDECTOMY  08/13/2005    FAMILY HISTORY: Family History  Problem Relation Age of Onset   Arthritis Mother    Hypertension Mother    Brain cancer Father    Hyperlipidemia Father    Hypertension Father    Barrett's esophagus Father        last EGD showed high-grade dysplasia   Arthritis Maternal Grandmother    Diabetes Maternal Grandmother    Hypertension Maternal Grandmother    Stroke Maternal Grandmother    Stroke Maternal Grandfather  Hearing loss Maternal Grandfather    Alcohol abuse Maternal Grandfather    Alcohol abuse Paternal Grandmother    Hypertension Paternal Grandmother    Alcohol abuse Paternal Grandfather    Early death Paternal Grandfather        unknown cause   Rectal cancer Neg Hx    Colon cancer Neg Hx    Esophageal cancer Neg Hx    Stomach cancer Neg Hx     SOCIAL HISTORY:  Social History   Socioeconomic History   Marital status: Single    Spouse name: Not on file   Number of children: 0   Years of education: Not on file   Highest education level: Not on file  Occupational History   Occupation: MRI Psychologist, clinical: [employer]  Tobacco Use   Smoking status: Never   Smokeless tobacco: Never  Vaping Use   Vaping Use: Never used  Substance and Sexual Activity   Alcohol use: No   Drug use: No   Sexual activity: Not on file  Other Topics Concern   Not on file  Social History Narrative   Single, no children.   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   No T/A/Ds.   Lives with mom in [place]   Right handed   Caffeine use: coffee 3-4 times per week, sweet tea sometimes, little soda   Social Determinants of Health   Financial Resource Strain: Not on file  Food Insecurity: Not on file  Transportation Needs: Not on file  Physical Activity: Not on file  Stress: Not on file  Social Connections: Not on file  Intimate Partner Violence: Not on file     PHYSICAL EXAM  Vitals:   07/24/21 0829  BP: (!) 165/85  Pulse: 79  Weight: 272 lb (123.4 kg)  Height: 6' (1.829 m)    Body mass index is 36.89 kg/m.   General: The patient is well-developed and well-nourished and in no acute distress  HEENT:  Head is /AT.  Sclera are anicteric.  Funduscopic exam shows normal optic discs and retinal vessels.  Neck: No carotid bruits are noted.  The neck is nontender.  Cardiovascular: The heart has a regular rate and rhythm with a normal S1 and S2. There were no murmurs, gallops or rubs.    Skin: Extremities are without rash or  edema.  Musculoskeletal:  Back is nontender  Neurologic Exam  Mental status: The patient is alert and oriented x 3 at the time of the examination. The patient has apparent normal recent and remote memory, with an apparently normal attention span and concentration ability.   Speech is normal.  Cranial nerves: Extraocular movements are full. Pupils are equal, round, and reactive to light and accomodation.  Visual fields are full.  Facial symmetry is present. There is good facial sensation to soft touch bilaterally.Facial strength is normal.  Trapezius and sternocleidomastoid strength is normal. No dysarthria is noted.  The tongue is midline, and the patient has symmetric elevation of the soft palate. No obvious hearing deficits are noted.  Motor:  Muscle bulk is normal.   Tone is normal. Strength is  5 / 5 in all 4 extremities.   Sensory: No Tinel's signs at wrist or elbow.   No Phalen's sign.  Sensory  testing is intact to pinprick, soft touch and vibration sensation in all 4 extremities.  Coordination: Cerebellar testing reveals good finger-nose-finger and heel-to-shin bilaterally.  Gait and station: Station  is normal.   Gait is normal. Tandem gait is normal. Romberg is negative.   Reflexes: Deep tendon reflexes are symmetric and normal in arms and mildly increased in legs (spread at knees, no ankle clonus)..   Plantar responses are flexor.    DIAGNOSTIC DATA (LABS, IMAGING, TESTING) - I reviewed patient records, labs, notes, testing and imaging myself where available.  Lab Results  Component Value Date   WBC 6.6 07/19/2021   HGB 14.0 07/19/2021   HCT 41.6 07/19/2021   MCV 87.8 07/19/2021   PLT 373 07/19/2021      Component Value Date/Time   NA 133 (L) 07/19/2021 0849   K 3.6 07/19/2021 0849   CL 101 07/19/2021 0849   CO2 25 07/19/2021 0849   GLUCOSE 92 07/19/2021 0849   BUN 15 07/19/2021 0849   CREATININE 0.76 07/19/2021 0849   CALCIUM 9.3 07/19/2021 0849   PROT 8.0 07/19/2021 0849   ALBUMIN 4.5 07/19/2021 0849   ALBUMIN 4.7 04/19/2021 1023   AST 23 07/19/2021 0849   ALT 35 07/19/2021 0849   ALKPHOS 61 07/19/2021 0849   BILITOT 0.7 07/19/2021 0849   GFRNONAA >60 07/19/2021 0849   GFRAA >60 03/03/2018 1446   Lab Results  Component Value Date   CHOL 215 (H) 02/02/2021   HDL 41.80 02/02/2021   LDLCALC 127 (H) 02/01/2020   LDLDIRECT 142.0 02/02/2021   TRIG 247.0 (H) 02/02/2021   CHOLHDL 5 02/02/2021   No results found for: HGBA1C No results found for: VITAMINB12 Lab Results  Component Value Date   TSH 0.79 02/02/2021       ASSESSMENT AND PLAN  White matter abnormality on MRI of brain - Plan: Notch3(Cadasil)Sequencing  Family history of genetic disease - Plan: Notch3(Cadasil)Sequencing  Numbness  Crohn's disease of colon without complication ([place])  In summary, Mr. Cartwright is a 25 year old man with an episode of numbness that has since mostly  resolved who was found on MRI scan to have multiple T2/FLAIR foci in the hemispheres.  Although the pattern was read as worrisome for MS, review of the images show that most of the foci are pericallosal rather than periventricular and this is a nonspecific pattern.  Because of his family history of CADASIL, that is also a concern.  The foci are also nonspecific for that disorder as there are no lesions in the anterior temporal lobes of the external capsules.  His mother had a severe fever when he was born and I cannot rule out that the foci or congenital due to an infection.  Because of the family history, I feel we need to get the CADASIL DNA test.  I will see if we can get the test done at [facility] as that is more likely to be run through his insurance and a more regular way.  If his co-pay will still be high we can run it through [facility] and I gave him information on their co-pay assistance program.  If the test is negative, we will need to do a follow-up MRI in the spring.  I will see him after that MRI is performed and I will do a side-by-side comparison.  He will call sooner if new or worsening neurologic symptoms.  Thank you for asking me to see Mr. Agyeman.  Please let me know if I can be of further assistance with him or other patients in the future.   Repeat in 6-9 months CADASIL  Regana Kemple A. Felecia Shelling, MD, [facility] 02/72/5366, 4:40 AM Certified in Neurology,  Clinical Neurophysiology, Sleep Medicine and Neuroimaging  [facility] [address], [place], [place] [address] [phone]

## 2021-07-27 LAB — MISC LABCORP TEST (SEND OUT): Labcorp test code: 503800

## 2021-08-01 ENCOUNTER — Encounter: Payer: Self-pay | Admitting: Neurology

## 2021-08-10 ENCOUNTER — Other Ambulatory Visit: Payer: Self-pay | Admitting: Neurology

## 2021-08-24 ENCOUNTER — Encounter: Payer: Self-pay | Admitting: Physician Assistant

## 2021-08-24 ENCOUNTER — Other Ambulatory Visit: Payer: Self-pay

## 2021-08-24 ENCOUNTER — Ambulatory Visit: Payer: No Typology Code available for payment source | Admitting: Physician Assistant

## 2021-08-24 DIAGNOSIS — Z1283 Encounter for screening for malignant neoplasm of skin: Secondary | ICD-10-CM

## 2021-08-24 NOTE — Progress Notes (Signed)
° °  New Patient   Subjective  Michael Moon is a 26 y.o. male who presents for the following: Annual Exam (General skin check per gastro Dr due to his medication for crohn's. No personal history of skin cancer possible bcc for his grandparents.). He has Rodney Booze type 1 skin and red hair.  He didn't get in the sun much throughout his life but has had some bad sunburns on the back of his necl.    The following portions of the chart were reviewed this encounter and updated as appropriate:  Tobacco   Allergies   Meds   Problems   Med Hx   Surg Hx   Fam Hx       Objective  Well appearing patient in no apparent distress; mood and affect are within normal limits.  A full examination was performed including scalp, head, eyes, ears, nose, lips, neck, chest, axillae, abdomen, back, buttocks, bilateral upper extremities, bilateral lower extremities, hands, feet, fingers, toes, fingernails, and toenails. All findings within normal limits unless otherwise noted below.  Head to toe No atypical nevi. No signs of non-mole skin cancer.    Assessment & Plan  Screening exam for skin cancer Head to toe  Yearly skin examinations.     I, Ansh Fauble, PA-C, have reviewed all documentation's for this visit.  The documentation on 08/24/21 for the exam, diagnosis, procedures and orders are all accurate and complete.

## 2021-08-28 ENCOUNTER — Other Ambulatory Visit (HOSPITAL_BASED_OUTPATIENT_CLINIC_OR_DEPARTMENT_OTHER): Payer: Self-pay

## 2021-08-28 ENCOUNTER — Other Ambulatory Visit: Payer: Self-pay

## 2021-08-28 ENCOUNTER — Emergency Department
Admission: RE | Admit: 2021-08-28 | Discharge: 2021-08-28 | Disposition: A | Discharge status: Home / Self Care | Payer: No Typology Code available for payment source | Source: Ambulatory Visit | Attending: Family Medicine | Admitting: Family Medicine

## 2021-08-28 VITALS — BP 141/92 | HR 102 | Temp 98.6°F | Resp 18 | Ht 72.0 in | Wt 270.0 lb

## 2021-08-28 DIAGNOSIS — J069 Acute upper respiratory infection, unspecified: Secondary | ICD-10-CM | POA: Diagnosis not present

## 2021-08-28 LAB — POCT RAPID STREP A (OFFICE): Rapid Strep A Screen: NEGATIVE

## 2021-08-28 LAB — POC SARS CORONAVIRUS 2 AG -  ED: SARS Coronavirus 2 Ag: NEGATIVE

## 2021-08-28 MED ORDER — AMOXICILLIN 875 MG PO TABS: 875.0000 mg | ORAL_TABLET | Freq: Two times a day (BID) | ORAL | 0 refills | Status: DC

## 2021-08-28 MED ORDER — AMOXICILLIN 875 MG PO TABS
875.0000 mg | ORAL_TABLET | Freq: Two times a day (BID) | ORAL | 0 refills | Status: DC
  Filled 2021-08-28: qty 14, 7d supply, fill #0

## 2021-08-28 NOTE — ED Triage Notes (Signed)
Pt c/o post nasal drip causing sore throat and cough x 3 days. Reports temp in 22F range.

## 2021-08-28 NOTE — Discharge Instructions (Signed)
Drink lots of water May take over-the-counter cough and cold medicines Your COVID test is negative, the strep test is negative.  This is caused by a respiratory virus You may return to work as soon as you have improvement in symptoms and your fever is gone If you fail to improve after 7 to 10 days, fill and take the antibiotic

## 2021-08-28 NOTE — ED Provider Notes (Signed)
[name]    CSN: [id number] Arrival date & time: 08/28/21  1245      History   Chief Complaint Chief Complaint  Patient presents with   Cough   Nasal Congestion    HPI FARRON WATROUS is a 26 y.o. male.   HPI  Pleasant 26 year old.  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  Is here for an upper respiratory infection.  He has runny stuffy nose, postnasal drip, and sore throat for 3 days.  Also cough.  Has had a low-grade fever.  Feels tired.  No known exposure to illness.  He has not done any testing.  Needs COVID testing for work. He does have a history of Crohn's disease and is on chronic immunosuppressive medicine  Past Medical History:  Diagnosis Date   Abnormal brain MRI 03/2021   Multiple white matter signal abnormalities around the lateral ventricles->? MS, ? CADASIL-advanced blood testing and LP planned by neurology as of 03/2021, plus referral to [place] [facility].   Anal fissure    Anemia    COVID-19 virus infection 05/10/2021   paxlovid   Crohn disease ([place]) dx 2012   Doing well on lialda and mercaptopurine as of 01/2018 (Dr. Carlean Purl). 03/2019 colonoscopy->mild active colitis. Recall 1 yr.   Fundic gland polyps of stomach, benign 10/2020   GERD (gastroesophageal reflux disease)    Hx of adenomatous colonic polyps 03/18/2019   Hyperlipemia, mixed 2019,2020   TLC   Obesity, Class I, BMI 30-34.9    Paresthesias    2022->L side of face, L hand and foot.  Lyme neg. See abnormal MRI in [place] section.   Seasonal allergic rhinitis     Patient Active Problem List   Diagnosis Date Noted   Family history of genetic disease 07/24/2021   White matter abnormality on MRI of brain 07/24/2021   Numbness 07/24/2021   Hx of adenomatous colonic polyps 03/18/2019   Class 2 obesity due to excess calories with body mass index (BMI) of 38.0 to 38.9 in adult 02/25/2019   Long-term use of immunosuppressant medication - 6 mercaptopurine 01/23/2019   Crohn's disease of colon  ([place]) 11/01/2017   Iron deficiency anemia 01/03/2016   GERD (gastroesophageal reflux disease) 03/13/2012   Allergic rhinitis 08/15/2007    Past Surgical History:  Procedure Laterality Date   COLONOSCOPY  2012;03/16/2019; 10/2020   2012 crohns. 03/2019->2 adenomas, mild active colitis, otherwise in remission. 10/2020-normal.   ESOPHAGOGASTRODUODENOSCOPY  2012   2012.  10/26/20->mild acute and chronic esophagitis (eosin neg)   TONSILLECTOMY AND ADENOIDECTOMY  08/13/2005       Home Medications    Prior to Admission medications   Medication Sig Start Date End Date Taking? Authorizing Provider  acetaminophen (TYLENOL) 500 MG tablet Take 1,000 mg by mouth every 6 (six) hours as needed for moderate pain.   Yes [provider]  cetirizine (ZYRTEC) 10 MG tablet Take 10 mg by mouth daily as needed for allergies.   Yes [provider]  famotidine (PEPCID) 10 MG tablet Take 10 mg by mouth daily as needed for heartburn or indigestion.   Yes [provider]  mercaptopurine (PURINETHOL) 50 MG tablet Take 2-3 tablets by mouth daily as directed. Patient taking differently: Take 100 mg by mouth daily. 04/03/21  Yes Gatha Mayer, MD  mesalamine (LIALDA) 1.2 g EC tablet TAKE 4 TABLETS BY MOUTH DAILY WITH BREAKFAST. 04/03/21 04/03/22 Yes Gatha Mayer, MD  omeprazole (PRILOSEC) 40 MG capsule TAKE 1 CAPSULE BY  MOUTH DAILY. 04/03/21 04/03/22 Yes Gatha Mayer, MD  amoxicillin (AMOXIL) 875 MG tablet Take 1 tablet (875 mg total) by mouth 2 (two) times daily. 08/28/21   Raylene Everts, MD    Family History Family History  Problem Relation Age of Onset   Arthritis Mother    Hypertension Mother    Brain cancer Father    Hyperlipidemia Father    Hypertension Father    Barrett's esophagus Father        last EGD showed high-grade dysplasia   Arthritis Maternal Grandmother    Diabetes Maternal Grandmother    Hypertension Maternal Grandmother    Stroke Maternal Grandmother     Stroke Maternal Grandfather    Hearing loss Maternal Grandfather    Alcohol abuse Maternal Grandfather    Alcohol abuse Paternal Grandmother    Hypertension Paternal Grandmother    Alcohol abuse Paternal Grandfather    Early death Paternal Grandfather        unknown cause   Rectal cancer Neg Hx    Colon cancer Neg Hx    Esophageal cancer Neg Hx    Stomach cancer Neg Hx     Social History Social History   Tobacco Use   Smoking status: Never   Smokeless tobacco: Never  Vaping Use   Vaping Use: Never used  Substance Use Topics   Alcohol use: No   Drug use: No     Allergies   Codeine   Review of Systems Review of Systems  See HPI Physical Exam Triage Vital Signs ED Triage Vitals  Enc Vitals Group     BP 08/28/21 1258 (!) 141/92     Pulse Rate 08/28/21 1258 (!) 102     Resp 08/28/21 1258 18     Temp 08/28/21 1258 98.6 F (37 C)     Temp Source 08/28/21 1258 Oral     SpO2 08/28/21 1258 97 %     Weight 08/28/21 1255 270 lb (122.5 kg)     Height 08/28/21 1255 6' (1.829 m)     Head Circumference --      Peak Flow --      Pain Score 08/28/21 1254 5     Pain Loc --      Pain Edu? --      Excl. in [place]? --    No data found.  Updated Vital Signs BP (!) 141/92 (BP Location: Right Arm)    Pulse (!) 102    Temp 98.6 F (37 C) (Oral)    Resp 18    Ht 6' (1.829 m)    Wt 122.5 kg    SpO2 97%    BMI 36.62 kg/m     Physical Exam Constitutional:      General: He is not in acute distress.    Appearance: He is well-developed. He is not ill-appearing.     Comments: Stocky, heavyset  HENT:     Head: Normocephalic and atraumatic.     Right Ear: Tympanic membrane and ear canal normal.     Left Ear: Tympanic membrane and ear canal normal.     Nose: Nose normal.     Mouth/Throat:     Mouth: Mucous membranes are moist.     Pharynx: Posterior oropharyngeal erythema present.     Comments: Tonsils surgically absent.  Posterior pharynx with erythema.  No sinus tenderness Eyes:      Conjunctiva/sclera: Conjunctivae normal.     Pupils: Pupils are equal, round, and reactive to light.  Cardiovascular:     Rate and Rhythm: Normal rate and regular rhythm.     Heart sounds: Normal heart sounds.  Pulmonary:     Effort: Pulmonary effort is normal. No respiratory distress.     Breath sounds: Normal breath sounds.  Abdominal:     General: There is no distension.     Palpations: Abdomen is soft.  Musculoskeletal:        General: Normal range of motion.     Cervical back: Normal range of motion and neck supple.  Skin:    General: Skin is warm and dry.  Neurological:     Mental Status: He is alert.  Psychiatric:        Mood and Affect: Mood normal.        Behavior: Behavior normal.     UC Treatments / Results  Labs (all labs ordered are listed, but only abnormal results are displayed) Labs Reviewed  POC SARS CORONAVIRUS 2 AG -  ED  POCT RAPID STREP A (OFFICE)   Rapid strep negative, rapid coronavirus test is also negative EKG   Radiology No results found.  Procedures Procedures (including critical care time)  Medications Ordered in UC Medications - No data to display  Initial Impression / Assessment and Plan / UC Course  I have reviewed the triage vital signs and the nursing notes.  Pertinent labs & imaging results that were available during my care of the patient were reviewed by me and considered in my medical decision making (see chart for details).     I explained to the patient that a large proportion of "sinus infection" are caused by viruses.  I feel like he has a respiratory virus.  If he fails to improve over the next few days, or if he is in a state of immunocompromise because of his medication, he can fill and take the amoxicillin.  Otherwise over-the-counter medicines are appropriate Final Clinical Impressions(s) / UC Diagnoses   Final diagnoses:  Acute upper respiratory infection     Discharge Instructions      Drink lots of  water May take over-the-counter cough and cold medicines Your COVID test is negative, the strep test is negative.  This is caused by a respiratory virus You may return to work as soon as you have improvement in symptoms and your fever is gone If you fail to improve after 7 to 10 days, fill and take the antibiotic     ED Prescriptions     Medication Sig Dispense Auth. Provider   amoxicillin (AMOXIL) 875 MG tablet Take 1 tablet (875 mg total) by mouth 2 (two) times daily. 14 tablet Raylene Everts, MD      PDMP not reviewed this encounter.   Raylene Everts, MD 08/28/21 [phone]

## 2021-09-01 ENCOUNTER — Ambulatory Visit: Payer: No Typology Code available for payment source | Admitting: Internal Medicine

## 2021-09-07 NOTE — Telephone Encounter (Signed)
Called [facility] at [phone] to see if they have copy of results they can send [place]. [facility] account # [id number]. Nothing showing up on portal online. Spoke w/ Rennie Natter.  Confirmed they see Notch3(Cadasil)Sequencing was drawn on 08/10/21. It was a send out test. This is genetics testing. Therefore, turn around 35-42 days. They received sample 08/13/2021. They will fax copy of results once back to [phone].

## 2021-09-11 LAB — NOTCH3 CADASIL SEQUENCING

## 2021-09-12 ENCOUNTER — Other Ambulatory Visit: Payer: Self-pay | Admitting: Neurology

## 2021-09-12 ENCOUNTER — Telehealth: Payer: Self-pay | Admitting: Neurology

## 2021-09-12 MED ORDER — TRAZODONE HCL 50 MG PO TABS
50.0000 mg | ORAL_TABLET | Freq: Every evening | ORAL | 1 refills | Status: DC | PRN
  Filled 2021-09-12: qty 90, 90d supply, fill #0

## 2021-09-12 MED ORDER — ROSUVASTATIN CALCIUM 10 MG PO TABS
10.0000 mg | ORAL_TABLET | Freq: Every day | ORAL | 3 refills | Status: DC
  Filled 2021-09-12: qty 90, 90d supply, fill #0

## 2021-09-12 NOTE — Telephone Encounter (Signed)
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  His uncle has CADASIL.  His mom has never been tested.  I discussed with him that there are not targeted treatments towards CADASIL.  However, it is recommended that he continue the baby aspirin that we started at the last visit.  Additionally, to possibly help to slow progression he should eliminate any vascular risk factors.  He does not smoke and does not have hypertension or diabetes mellitus.  We discussed exercising more to keep his weight down.  Additionally, he has had borderline lipid panels in the past so I will start a statin drug to take daily.Marland Kitchen

## 2021-09-13 ENCOUNTER — Other Ambulatory Visit: Payer: Self-pay

## 2021-09-13 ENCOUNTER — Other Ambulatory Visit: Payer: Self-pay | Admitting: *Deleted

## 2021-09-13 DIAGNOSIS — E559 Vitamin D deficiency, unspecified: Secondary | ICD-10-CM

## 2021-09-15 ENCOUNTER — Other Ambulatory Visit
Admission: RE | Admit: 2021-09-15 | Discharge: 2021-09-15 | Disposition: A | Discharge status: Home / Self Care | Payer: No Typology Code available for payment source | Attending: Neurology | Admitting: Neurology

## 2021-09-15 DIAGNOSIS — E559 Vitamin D deficiency, unspecified: Secondary | ICD-10-CM | POA: Insufficient documentation

## 2021-09-15 LAB — VITAMIN D 25 HYDROXY (VIT D DEFICIENCY, FRACTURES): Vit D, 25-Hydroxy: 57.1 ng/mL (ref 30–100)

## 2021-09-26 ENCOUNTER — Other Ambulatory Visit: Payer: Self-pay | Admitting: *Deleted

## 2021-09-26 ENCOUNTER — Encounter: Payer: Self-pay | Admitting: Neurology

## 2021-09-26 DIAGNOSIS — Z8489 Family history of other specified conditions: Secondary | ICD-10-CM

## 2021-09-26 DIAGNOSIS — R9082 White matter disease, unspecified: Secondary | ICD-10-CM

## 2021-09-26 DIAGNOSIS — E559 Vitamin D deficiency, unspecified: Secondary | ICD-10-CM

## 2021-09-26 DIAGNOSIS — R200 Anesthesia of skin: Secondary | ICD-10-CM

## 2021-09-26 DIAGNOSIS — K5010 Crohn's disease of large intestine without complications: Secondary | ICD-10-CM

## 2021-09-27 ENCOUNTER — Telehealth: Payer: Self-pay | Admitting: Neurology

## 2021-09-27 DIAGNOSIS — R9082 White matter disease, unspecified: Secondary | ICD-10-CM

## 2021-09-27 DIAGNOSIS — Z8489 Family history of other specified conditions: Secondary | ICD-10-CM

## 2021-09-27 DIAGNOSIS — K5010 Crohn's disease of large intestine without complications: Secondary | ICD-10-CM

## 2021-09-27 DIAGNOSIS — R200 Anesthesia of skin: Secondary | ICD-10-CM

## 2021-09-27 NOTE — Telephone Encounter (Signed)
I put in new order, thank you

## 2021-09-27 NOTE — Telephone Encounter (Signed)
Can you switch the MRA Head to MRA Head without contrast?

## 2021-09-27 NOTE — Telephone Encounter (Signed)
Thank you, order was sent to GI. They will reach out to the patient to schedule.  [facility] no auth req.

## 2021-10-02 ENCOUNTER — Encounter: Payer: Self-pay | Admitting: Internal Medicine

## 2021-10-02 ENCOUNTER — Other Ambulatory Visit: Payer: No Typology Code available for payment source

## 2021-10-02 ENCOUNTER — Ambulatory Visit: Payer: No Typology Code available for payment source | Admitting: Internal Medicine

## 2021-10-02 VITALS — BP 128/82 | HR 76 | Ht 73.0 in | Wt 269.0 lb

## 2021-10-02 DIAGNOSIS — Z7960 Long term (current) use of unspecified immunomodulators and immunosuppressants: Secondary | ICD-10-CM | POA: Diagnosis not present

## 2021-10-02 DIAGNOSIS — R194 Change in bowel habit: Secondary | ICD-10-CM | POA: Diagnosis not present

## 2021-10-02 DIAGNOSIS — K219 Gastro-esophageal reflux disease without esophagitis: Secondary | ICD-10-CM | POA: Diagnosis not present

## 2021-10-02 DIAGNOSIS — I67850 Cerebral autosomal dominant arteriopathy with subcortical infarcts and leukoencephalopathy: Secondary | ICD-10-CM | POA: Insufficient documentation

## 2021-10-02 DIAGNOSIS — K50118 Crohn's disease of large intestine with other complication: Secondary | ICD-10-CM

## 2021-10-02 NOTE — Progress Notes (Signed)
Michael Moon 25 y.o. 09/02/1995 [id number]  Assessment & Plan:   Encounter Diagnoses  Name Primary?   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  His 6-MP is barely therapeutic.  He had no active inflammation on colonoscopy last year.  I am going to check fecal calprotectin.  If elevated we should consider changing therapy probably by increasing dose of 6-MP perhaps.  Strangely in the past when I went up on his 6-MP his 6-TG level went down.  I do not think there is any relationship between his cerebrovascular arteriopathy issue but we will always need to keep this in mind.  I think aspirin is appropriate to take and I do not think it is going to trigger Crohn's flare necessarily.  That is typically with NSAIDs and not salicylates.  Continue GERD therapy as he is.  Plan to recheck labs for 6-MP (CBC and t) after I see the fecal calprotectin testing.  He had it in December so March April would be the next time.  CC: McGowen, Adrian Blackwater, MD   Subjective:   Chief Complaint: Follow-up of Crohn's disease and reflux  HPI Michael Moon is a 26 year old man with a history of Crohn's colitis, last colonoscopy March 2022, here for follow-up.  He is treated with 6-MP 100 mg daily and mesalamine in the form of Lialda 4.8 g daily.  That colonoscopy in 2022 showed numerous pseudopolyps, a normal terminal ileum and no evidence for active disease or dysplasia on surveillance biopsies.  There was a distal diminutive rectal hyperplastic polyp removed as well.  An EGD at that time showed grade a esophagitis from reflux proven by biopsy and a small fundic gland  polyp.  He is on PPI with good results.    He is reporting an increase in bowel movement frequency with solid stools but some urgency.  From 1-2 a day to 3 or 4 a day and once or twice a month and will interrupt sleep.  No bleeding or mucus.  No perianal problems.  He did use Analpram about 2 to 3 months ago for a day or 2 when he had some soreness he says when his fissure acts up.  But he goes months in between episodes. He had labs in December.    He experienced neurologic symptoms last year and an evaluation has determined he has CADASIL which is cerebral autosomal dominant arteriopathy with infarcts and leukoencephalopathy.  He is now on a baby aspirin to reduce problems.  Turns out that his mother most likely has it and an uncle had it.  He had labs in December.  Thiopurine metabolites were 243 which is in the therapeutic range of 235-450 and metabolites were 202 which were well below the threshold for toxicity.  CBC and LFTs are normal.  His vitamin D had been in the lower range and his neurologist is pushing that.  Last level 57.10.  He is to take 1 to 2000 units daily.  Wt Readings from Last 3 Encounters:  10/02/21 269 lb (122 kg)  08/28/21 270 lb (122.5 kg)  07/24/21 272 lb (123.4 kg)    Allergies  Allergen Reactions   Codeine  Rash   Current Meds  Medication Sig   acetaminophen (TYLENOL) 500 MG tablet Take 1,000 mg by mouth every 6 (six) hours as needed for moderate pain.   aspirin EC 81 MG tablet Take 81 mg by mouth daily. Swallow whole.   cetirizine (ZYRTEC) 10 MG tablet Take 10 mg by mouth daily as needed for allergies.   famotidine (PEPCID) 10 MG tablet Take 10 mg by mouth daily as needed for heartburn or indigestion.   mercaptopurine (PURINETHOL) 50 MG tablet Take 100 mg by mouth daily. Give on an empty stomach 1 hour before or 2 hours after meals. Caution: Chemotherapy.   mesalamine (LIALDA) 1.2 g EC tablet TAKE 4 TABLETS BY MOUTH DAILY WITH BREAKFAST.   omeprazole  (PRILOSEC) 40 MG capsule TAKE 1 CAPSULE BY MOUTH DAILY.   rosuvastatin (CRESTOR) 10 MG tablet Take 1 tablet (10 mg total) by mouth daily.           Past Medical History:  Diagnosis Date   Abnormal brain MRI 03/2021   Multiple white matter signal abnormalities around the lateral ventricles->? MS, ? CADASIL-advanced blood testing and LP planned by neurology as of 03/2021, plus referral to [place] [facility].   Anal fissure    Anemia    CADASIL    COVID-19 virus infection 05/10/2021   paxlovid   Crohn disease ([place]) dx 2012   Doing well on lialda and mercaptopurine as of 01/2018 (Dr. Carlean Purl). 03/2019 colonoscopy->mild active colitis. Recall 1 yr.   Fundic gland polyps of stomach, benign 10/2020   GERD (gastroesophageal reflux disease)    Hx of adenomatous colonic polyps 03/18/2019   Hyperlipemia, mixed 2019,2020   TLC   Obesity, Class I, BMI 30-34.9    Paresthesias    2022->L side of face, L hand and foot.  Lyme neg. See abnormal MRI in [place] section.   Seasonal allergic rhinitis    Past Surgical History:  Procedure Laterality Date   COLONOSCOPY  2012;03/16/2019; 10/2020   2012 crohns. 03/2019->2 adenomas, mild active colitis, otherwise in remission. 10/2020-normal.   ESOPHAGOGASTRODUODENOSCOPY  2012   2012.  10/26/20->mild acute and chronic esophagitis (eosin neg)   TONSILLECTOMY AND ADENOIDECTOMY  08/13/2005   Social History   Social History Narrative   Single, no children.   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   No T/A/Ds.   Lives with mom in [place]   Right handed   Caffeine use: coffee 3-4 times per week, sweet tea sometimes, little soda   family history includes Alcohol abuse in his maternal grandfather, paternal grandfather, and paternal grandmother; Arthritis in his maternal grandmother and mother; Barrett's esophagus in his father; Brain cancer in his father; Diabetes in his maternal grandmother; Early death in his paternal grandfather;  Hearing loss in his maternal grandfather; Hyperlipidemia in his father; Hypertension in his father, maternal grandmother, mother, and paternal grandmother; Stroke in his maternal grandfather and maternal grandmother.   Review of Systems As per HPI  Objective:   Physical Exam BP 128/82    Pulse 76    Ht 6\' 1"  (1.854 m)    Wt 269 lb (122 kg)    SpO2 98%    BMI 35.49 kg/m  Well-developed well-nourished white man in no acute distress Lungs clear Heart sounds normal Abdomen soft and nontender bowel sounds are present no mass He is alert noted x3

## 2021-10-02 NOTE — Patient Instructions (Signed)
Your provider has requested that you go to the basement level for lab work before leaving today. Press "B" on the elevator. The lab is located at the first door on the left as you exit the elevator.  If you are age 28 or younger, your body mass index should be between 19-25. Your Body mass index is 35.49 kg/m. If this is out of the aformentioned range listed, please consider follow up with your Primary Care Provider.   ________________________________________________________  The [facility] providers would like to encourage you to use [facility] to communicate with providers for non-urgent requests or questions.  Due to long hold times on the telephone, sending your provider a message by [facility] may be a faster and more efficient way to get a response.  Please allow 48 business hours for a response.  Please remember that this is for non-urgent requests.  _______________________________________________________  Thank you for choosing me and [facility].  Dr. Silvano Rusk

## 2021-10-05 ENCOUNTER — Other Ambulatory Visit: Payer: No Typology Code available for payment source

## 2021-10-05 DIAGNOSIS — R194 Change in bowel habit: Secondary | ICD-10-CM

## 2021-10-05 DIAGNOSIS — K219 Gastro-esophageal reflux disease without esophagitis: Secondary | ICD-10-CM

## 2021-10-05 DIAGNOSIS — Z7960 Long term (current) use of unspecified immunomodulators and immunosuppressants: Secondary | ICD-10-CM

## 2021-10-05 DIAGNOSIS — K50118 Crohn's disease of large intestine with other complication: Secondary | ICD-10-CM

## 2021-10-09 LAB — CALPROTECTIN, FECAL: Calprotectin, Fecal: 70 ug/g (ref 0–120)

## 2021-10-15 ENCOUNTER — Other Ambulatory Visit: Payer: Self-pay | Admitting: Family Medicine

## 2021-10-15 DIAGNOSIS — Z79899 Other long term (current) drug therapy: Secondary | ICD-10-CM

## 2021-10-16 ENCOUNTER — Other Ambulatory Visit: Payer: Self-pay

## 2021-10-16 NOTE — Telephone Encounter (Signed)
Please fill, if appropriate. Last OV 10/02/21 ? ?Med pending ?

## 2021-10-17 ENCOUNTER — Other Ambulatory Visit: Payer: Self-pay

## 2021-10-17 MED ORDER — MERCAPTOPURINE 50 MG PO TABS
100.0000 mg | ORAL_TABLET | Freq: Every day | ORAL | 0 refills | Status: DC
Start: 2021-10-17 — End: 2022-02-04
  Filled 2021-10-17: qty 180, 90d supply, fill #0

## 2021-10-17 NOTE — Telephone Encounter (Signed)
Lab orders put into epic and Michael Moon to come have them done. I was unable to reach him by phone. ?

## 2021-10-28 ENCOUNTER — Ambulatory Visit
Admission: RE | Admit: 2021-10-28 | Discharge: 2021-10-28 | Disposition: A | Discharge status: Home / Self Care | Payer: No Typology Code available for payment source | Source: Ambulatory Visit | Attending: Neurology | Admitting: Neurology

## 2021-10-28 ENCOUNTER — Other Ambulatory Visit: Payer: Self-pay

## 2021-10-28 DIAGNOSIS — Z8489 Family history of other specified conditions: Secondary | ICD-10-CM

## 2021-10-28 DIAGNOSIS — R200 Anesthesia of skin: Secondary | ICD-10-CM | POA: Diagnosis not present

## 2021-10-28 DIAGNOSIS — K5010 Crohn's disease of large intestine without complications: Secondary | ICD-10-CM

## 2021-10-28 DIAGNOSIS — E559 Vitamin D deficiency, unspecified: Secondary | ICD-10-CM

## 2021-10-28 DIAGNOSIS — R9082 White matter disease, unspecified: Secondary | ICD-10-CM

## 2021-10-28 IMAGING — MR MR MRA HEAD W/O CM
1 series · 9 of 48 positions shown · IV contrast (gadavist)
Comparison: MRI brain [DATE].

CLINICAL DATA: Vitamin D deficiency [R1] ([R1]-CM). Family
history of CADASIL. White matter abnormality on MRI of brain [R1]
([R1]-CM). Family history of genetic disease [R1] ([R1]-CM).
Numbness [R1] ([R1]-CM). Crohn's disease of colon without
complication (HCC) [R1] ([R1]-CM).

EXAM:
MRI HEAD WITHOUT AND WITH CONTRAST
MRA HEAD WITHOUT CONTRAST
TECHNIQUE: Multiplanar, multi-echo pulse sequences of the brain and surrounding
structures were acquired without and with intravenous contrast.
Angiographic images of the Circle of Willis were acquired using MRA
technique without intravenous contrast.
CONTRAST:  10mL GADAVIST GADOBUTROL 1 MMOL/ML IV SOLN

[Series 2: [id number] · axial · 0.5mm · 0.26mm/px · z∈[-16,+52]mm · 9 of 175 slices shown]
[im 12/175]
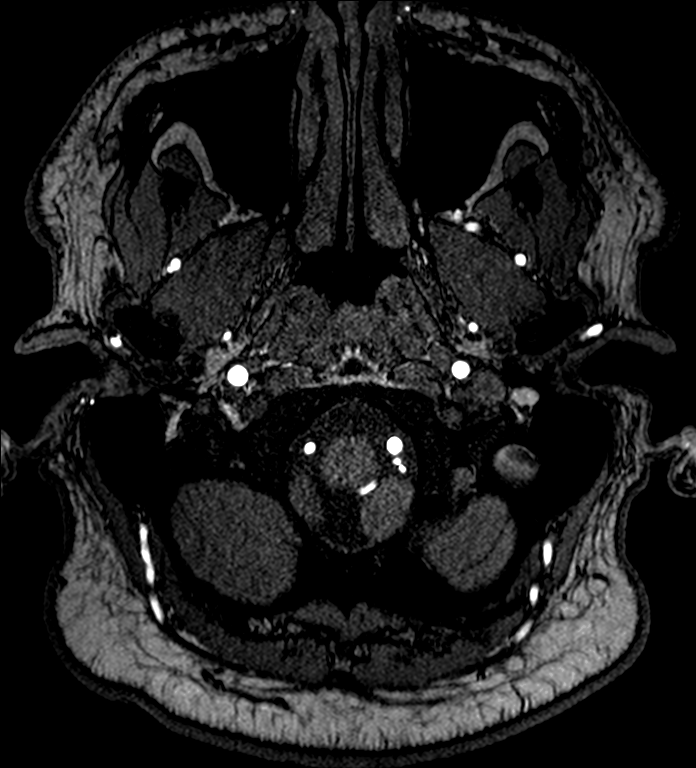
[im 30/175]
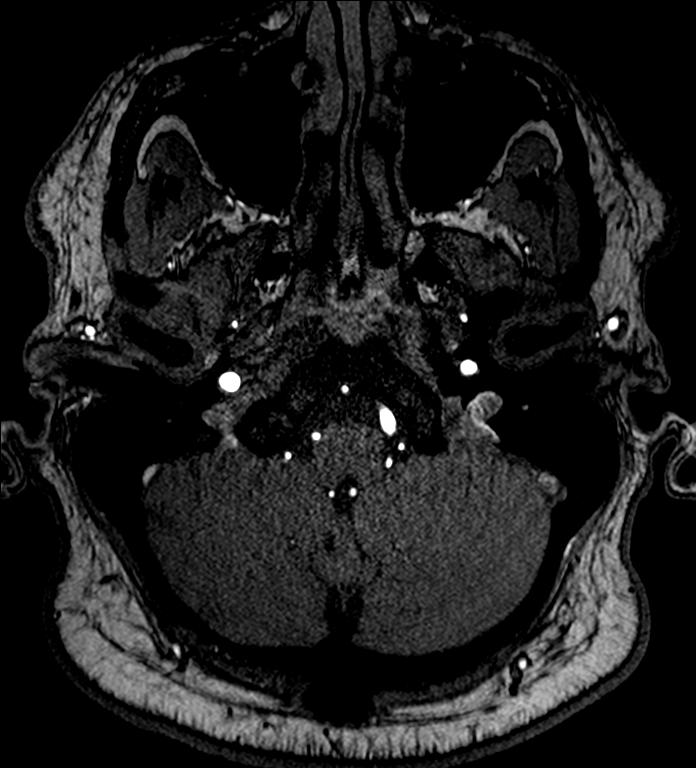
[im 34/175]
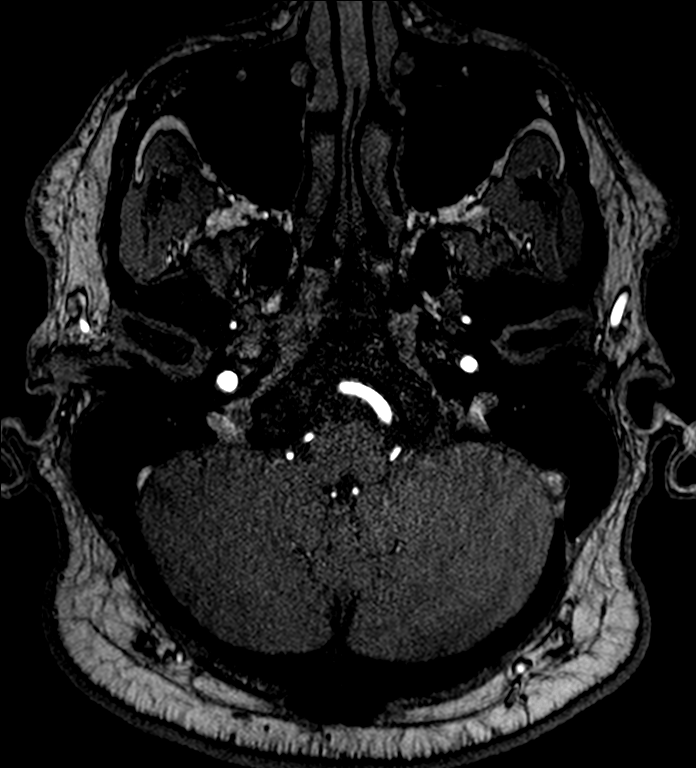
[im 56/175]
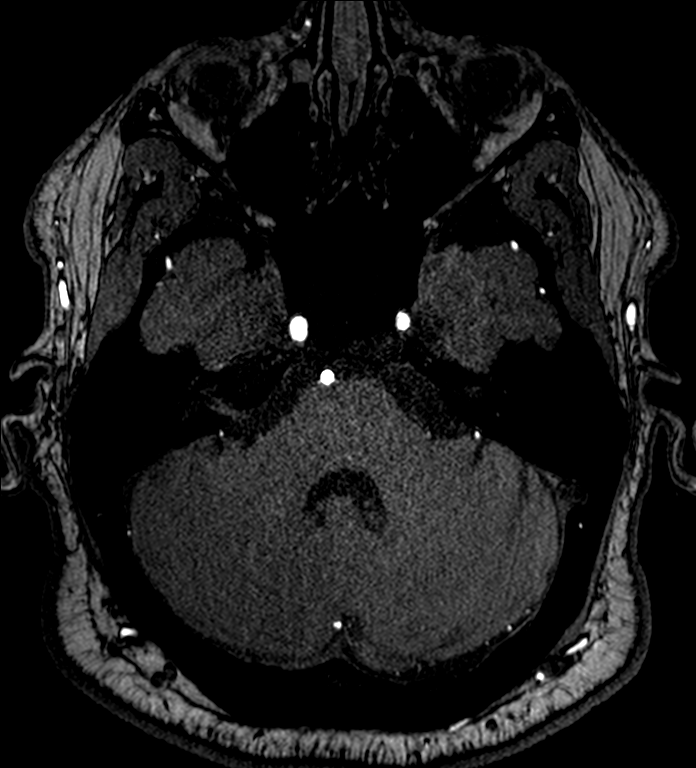
[im 78/175]
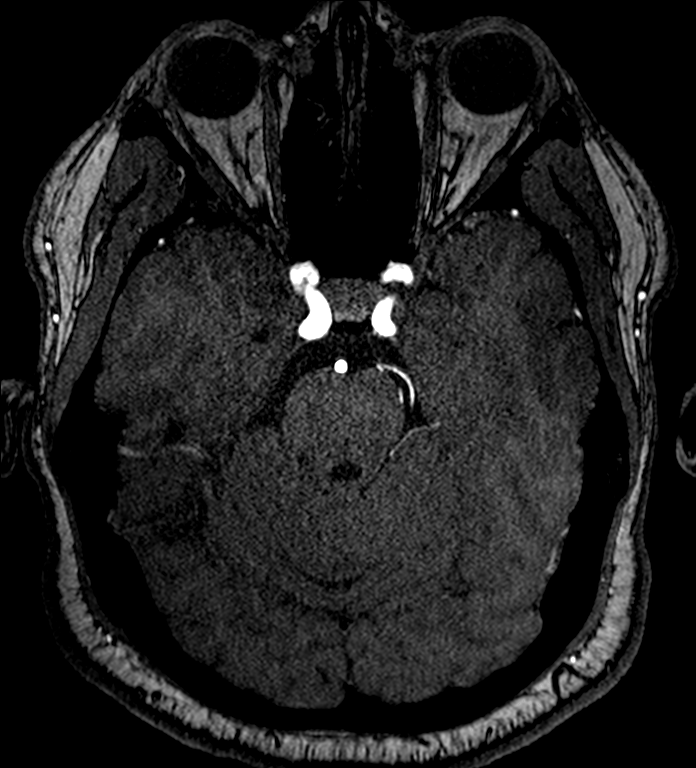
[im 89/175]
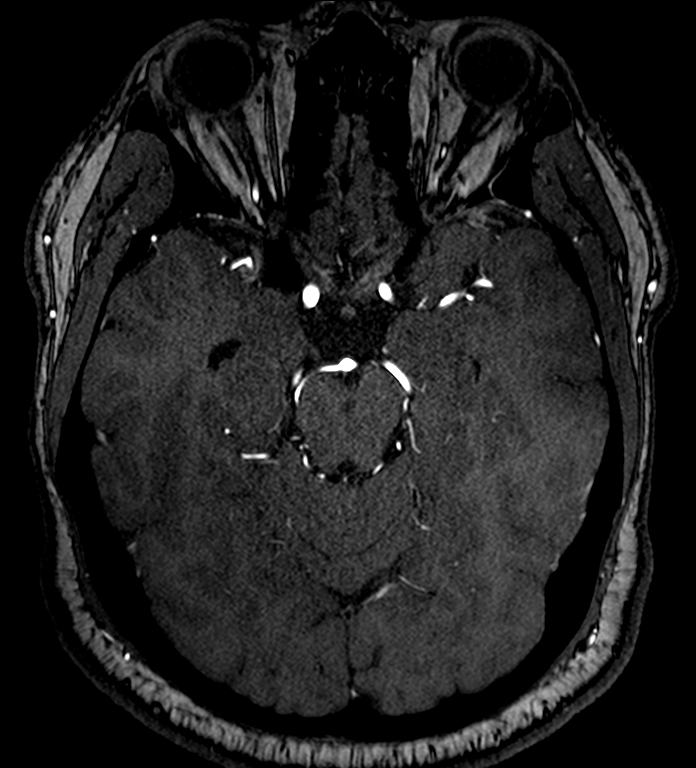
[im 100/175]
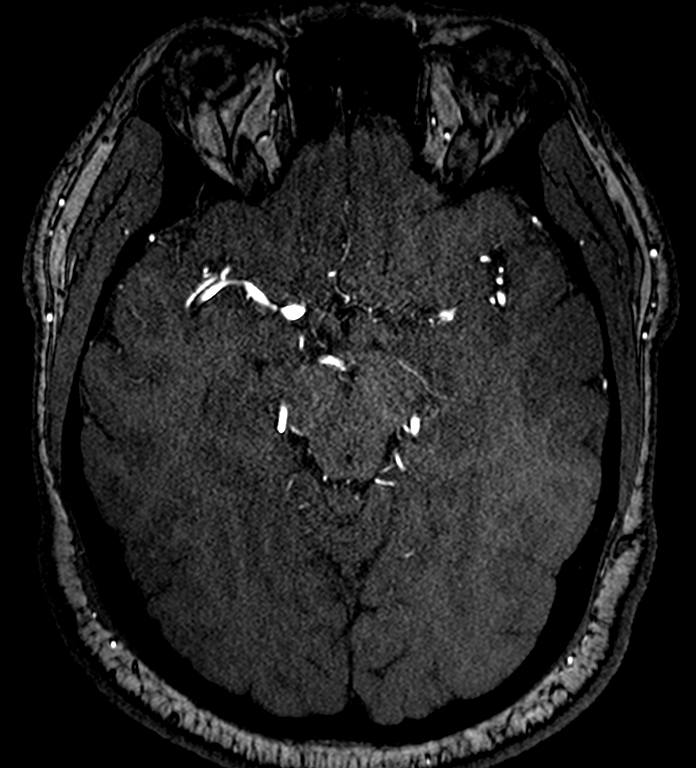
[im 123/175]
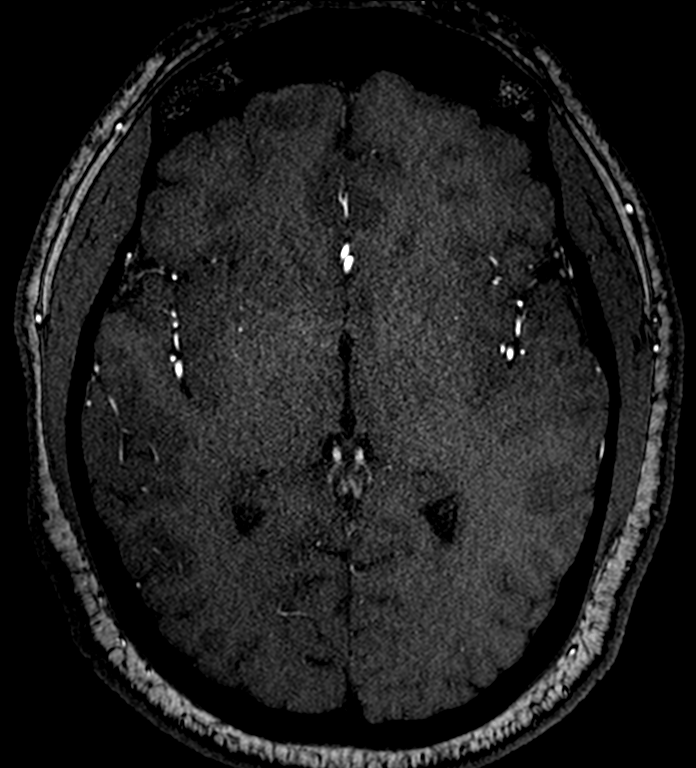
[im 149/175]
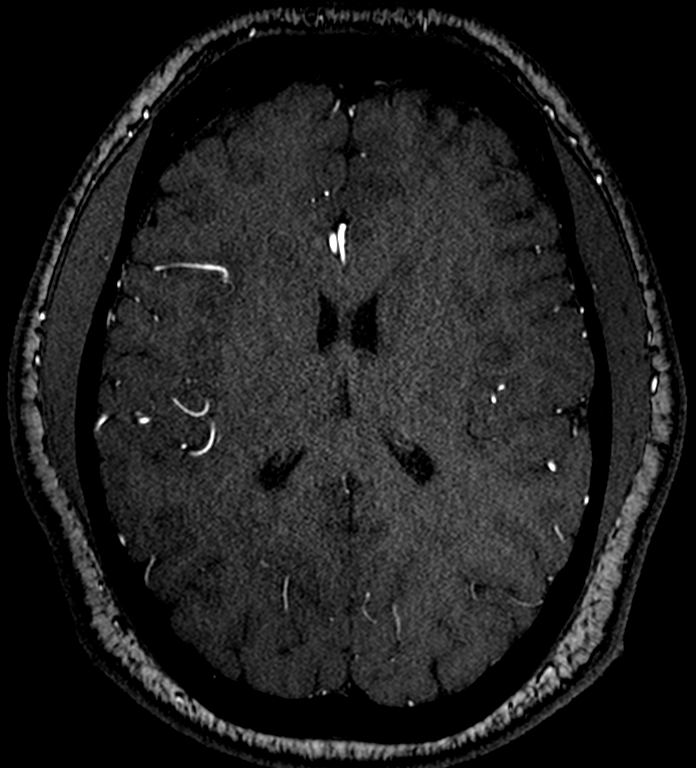

[9 of 48 positions shown; findings below may reference images not displayed]

FINDINGS: MRI HEAD FINDINGS

Brain: No acute infarction, hemorrhage, hydrocephalus, extra-axial
collection or mass lesion. No pathologic intracranial enhancement.
Scattered foci of T2 hyperintensity are seen within the white matter
of the cerebral hemispheres in a nonspecific distribution. No
anterior temporal lobe lesions for external capsule lesion
identified. A few punctate lesions in the bilateral anterior
superior frontal gyri. No focus of abnormal contrast enhancement.
When compared to prior MRI, no significant changes noted.

Vascular: Normal flow voids.

Skull and upper cervical spine: Normal marrow signal.

Sinuses/Orbits: No acute or significant finding.

Other: None.

MRA HEAD FINDINGS

Anterior circulation: The visualized portions of the distal cervical
and intracranial internal carotid arteries are widely patent with
normal flow related enhancement. The bilateral anterior cerebral
arteries and middle cerebral arteries are widely patent with
antegrade flow without high-grade flow-limiting stenosis or proximal
branch occlusion. No intracranial aneurysm within the anterior
circulation.

Posterior circulation: The vertebral arteries are widely patent with
antegrade flow. The posterior inferior cerebral arteries are normal.
Vertebrobasilar junction and basilar artery are widely patent with
antegrade flow without evidence of basilar stenosis or aneurysm.
Posterior cerebral arteries are normal bilaterally. No intracranial
aneurysm within the posterior circulation.

Anatomic variants: Severely hypoplastic versus aplastic left A1/ACA
segment with both A2 segments supplied by a prominent right A2/ACA
segment.
IMPRESSION: 1. Stable appearance of T2 hyperintense lesions of the white matter
of the cerebral hemispheres. While the distribution pattern of the
lesions is nonspecific, given positive [R1] and family history,
CADASIL is a strong consideration. Differential diagnosis include
demyelinating, autoimmune disease and post inflammatory/infectious
process.
2. Unremarkable MR angiogram of the brain.

## 2021-10-28 IMAGING — MR MR HEAD WO/W CM
15 series · 48 of 48 positions shown · IV contrast (GADAVIST 10)
Comparison: MRI brain [DATE].

CLINICAL DATA: Vitamin D deficiency [R1] ([R1]-CM). Family
history of CADASIL. White matter abnormality on MRI of brain [R1]
([R1]-CM). Family history of genetic disease [R1] ([R1]-CM).
Numbness [R1] ([R1]-CM). Crohn's disease of colon without
complication (HCC) [R1] ([R1]-CM).

EXAM:
MRI HEAD WITHOUT AND WITH CONTRAST
MRA HEAD WITHOUT CONTRAST
TECHNIQUE: Multiplanar, multi-echo pulse sequences of the brain and surrounding
structures were acquired without and with intravenous contrast.
Angiographic images of the Circle of Willis were acquired using MRA
technique without intravenous contrast.
CONTRAST:  10mL GADAVIST GADOBUTROL 1 MMOL/ML IV SOLN

[Series 5: T1 · sagittal · 4.0mm · 0.75mm/px · 1 of 31 slices shown (1 of 3)]
[im 1/31]
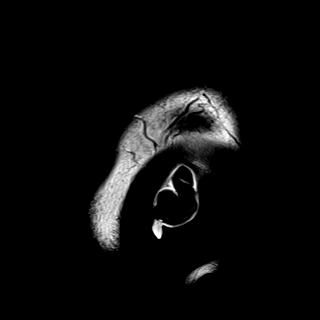

[Series 6: DWI · axial · 3.0mm · 0.94mm/px · z∈[-31,+109]mm · 8 of 160 slices shown (1 of 3)]
[im 1/160]
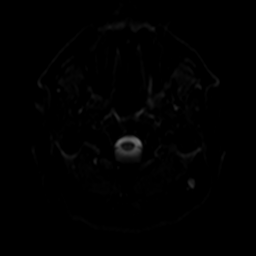
[im 23/160]
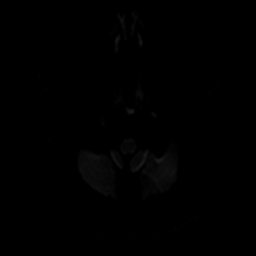
[im 46/160]
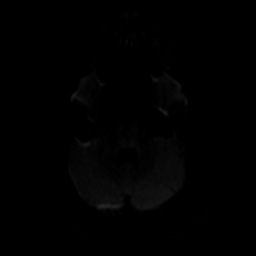
[im 69/160]
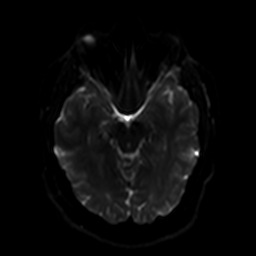
[im 91/160]
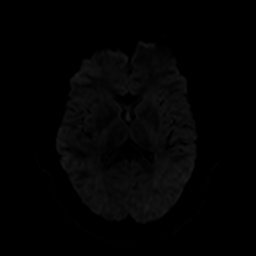
[im 114/160]
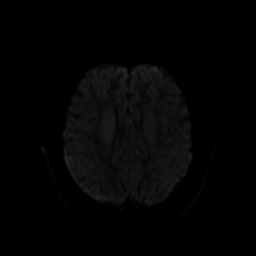
[im 137/160]
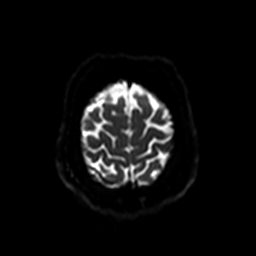
[im 160/160]
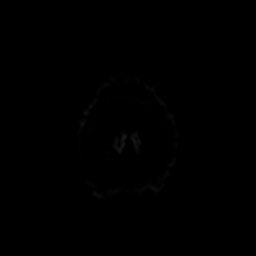

[Series 7: ax dwi_tracew · axial · 3.0mm · 0.94mm/px · z∈[-31,+109]mm · 4 of 80 slices shown]
[im 1/80]
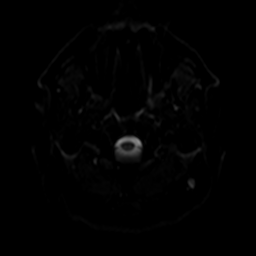
[im 27/80]
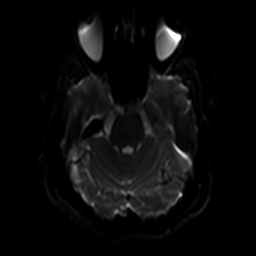
[im 53/80]
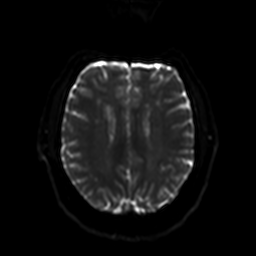
[im 80/80]
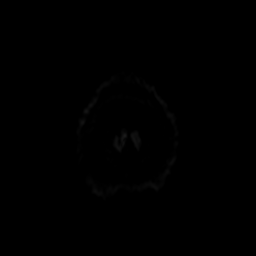

[Series 8: ax dwi_adc · axial · 3.0mm · 0.94mm/px · z∈[-31,+109]mm · 2 of 40 slices shown]
[im 1/40]
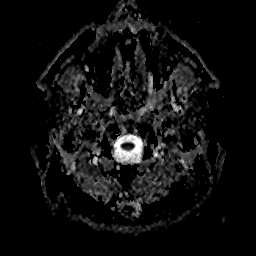
[im 40/40]
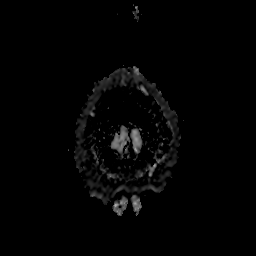

[Series 9: DWI · coronal · 5.0mm · 1.44mm/px · 3 of 64 slices shown (2 of 3)]
[im 1/64]
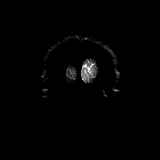
[im 32/64]
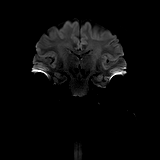
[im 64/64]
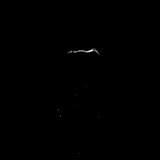

[Series 10: DWI · coronal · 5.0mm · 1.44mm/px · 2 of 32 slices shown (3 of 3)]
[im 1/32]
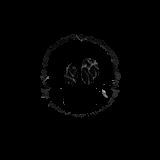
[im 32/32]
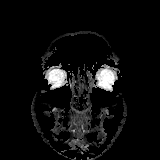

[Series 16: FLAIR · sagittal · 4.0mm · 0.72mm/px · 1 of 28 slices shown (1 of 2)]
[im 1/28]
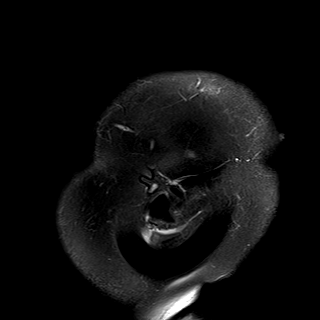

[Series 17: T2 · axial · 4.0mm · 0.36mm/px · 1 of 27 slices shown (1 of 2)]
[im 1/27]
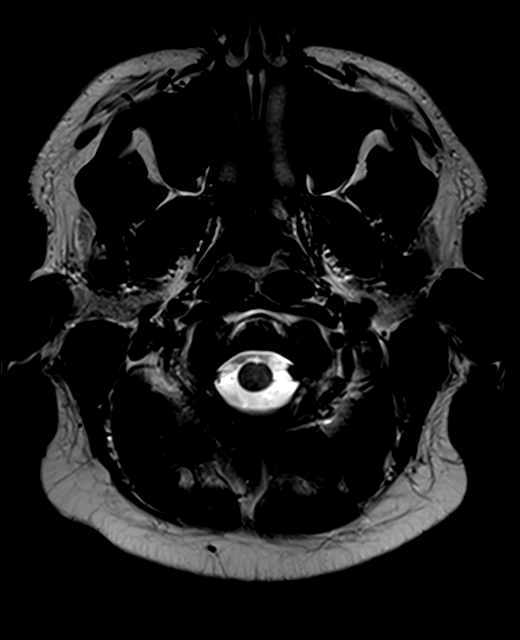

[Series 18: FLAIR · axial · 3.0mm · 0.72mm/px · 1 of 26 slices shown (2 of 2)]
[im 1/26]
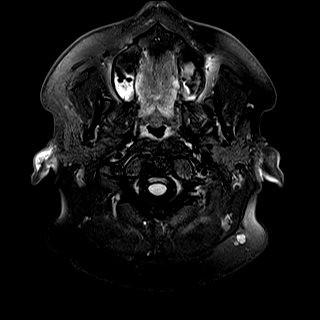

[Series 19: swi_images · axial · 2.0mm · 0.90mm/px · z∈[-25,+116]mm · 3 of 72 slices shown]
[im 1/72]
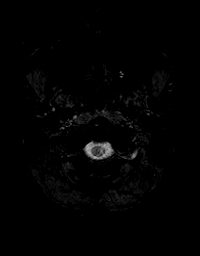
[im 36/72]
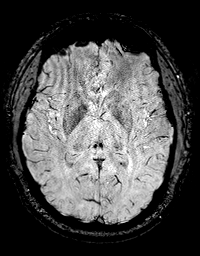
[im 72/72]
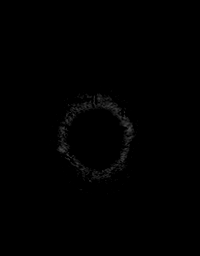

[Series 20: mip_images(sw) · axial · 16.0mm · 0.90mm/px · z∈[-18,+109]mm · 3 of 65 slices shown]
[im 1/65]
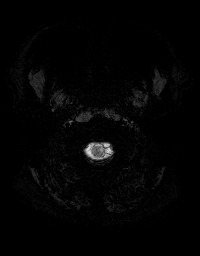
[im 33/65]
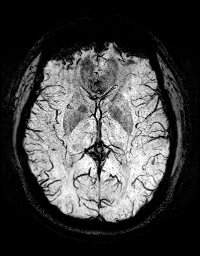
[im 65/65]
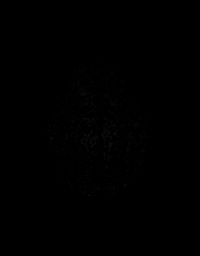

[Series 21: T1 · axial · 1.0mm · 0.94mm/px · z∈[-49,+110]mm · 8 of 160 slices shown (2 of 3)]
[im 1/160]
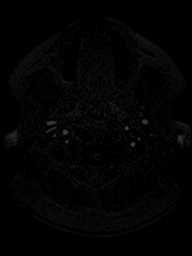
[im 23/160]
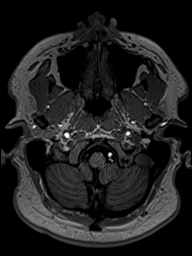
[im 46/160]
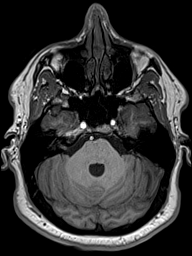
[im 69/160]
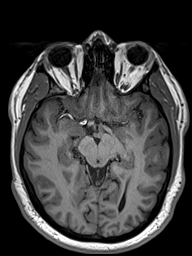
[im 91/160]
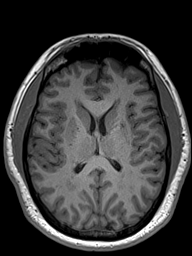
[im 114/160]
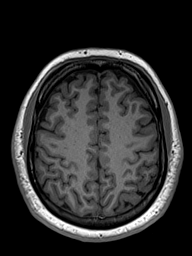
[im 137/160]
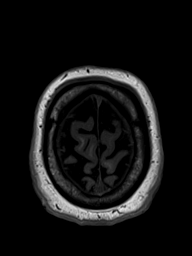
[im 160/160]
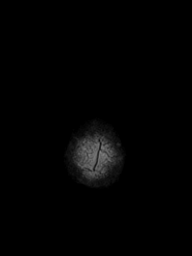

[Series 22: T2 · coronal · 4.5mm · 0.36mm/px · 1 of 30 slices shown (2 of 2)]
[im 1/30]
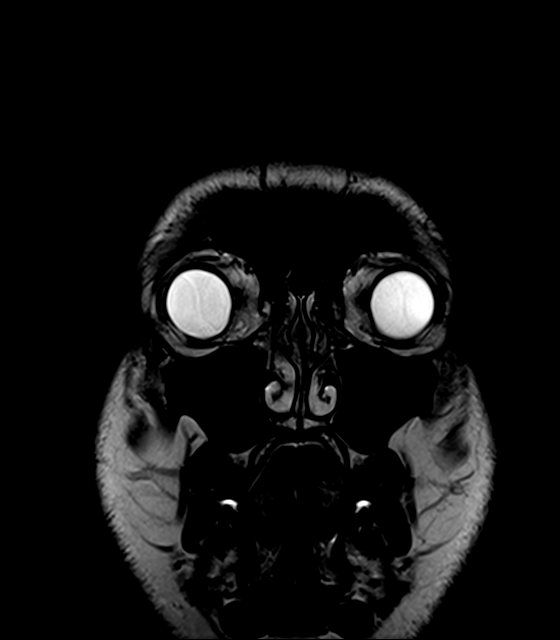

[Series 23: T1 · axial · 1.0mm · 0.94mm/px · z∈[-49,+110]mm · 8 of 160 slices shown (3 of 3)]
[im 1/160]
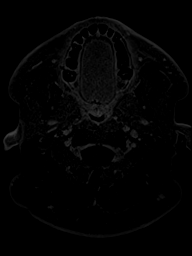
[im 23/160]
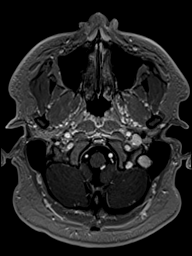
[im 46/160]
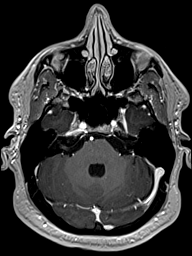
[im 69/160]
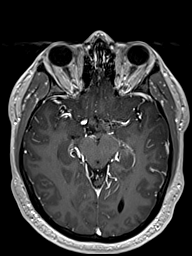
[im 91/160]
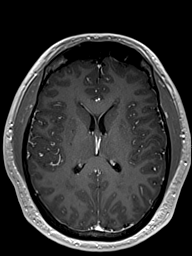
[im 114/160]
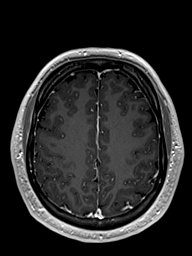
[im 137/160]
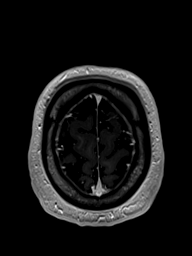
[im 160/160]
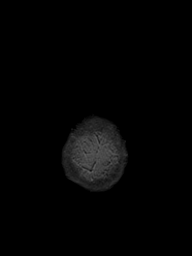

[Series 24: T1 post-contrast · coronal · 4.0mm · 0.72mm/px · 2 of 34 slices shown]
[im 1/34]
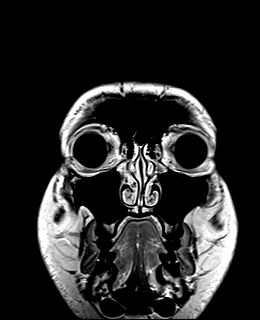
[im 34/34]
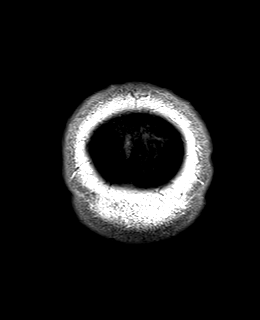

[48 of 48 positions shown; findings below may reference images not displayed]

FINDINGS: MRI HEAD FINDINGS

Brain: No acute infarction, hemorrhage, hydrocephalus, extra-axial
collection or mass lesion. No pathologic intracranial enhancement.
Scattered foci of T2 hyperintensity are seen within the white matter
of the cerebral hemispheres in a nonspecific distribution. No
anterior temporal lobe lesions for external capsule lesion
identified. A few punctate lesions in the bilateral anterior
superior frontal gyri. No focus of abnormal contrast enhancement.
When compared to prior MRI, no significant changes noted.

Vascular: Normal flow voids.

Skull and upper cervical spine: Normal marrow signal.

Sinuses/Orbits: No acute or significant finding.

Other: None.

MRA HEAD FINDINGS

Anterior circulation: The visualized portions of the distal cervical
and intracranial internal carotid arteries are widely patent with
normal flow related enhancement. The bilateral anterior cerebral
arteries and middle cerebral arteries are widely patent with
antegrade flow without high-grade flow-limiting stenosis or proximal
branch occlusion. No intracranial aneurysm within the anterior
circulation.

Posterior circulation: The vertebral arteries are widely patent with
antegrade flow. The posterior inferior cerebral arteries are normal.
Vertebrobasilar junction and basilar artery are widely patent with
antegrade flow without evidence of basilar stenosis or aneurysm.
Posterior cerebral arteries are normal bilaterally. No intracranial
aneurysm within the posterior circulation.

Anatomic variants: Severely hypoplastic versus aplastic left A1/ACA
segment with both A2 segments supplied by a prominent right A2/ACA
segment.
IMPRESSION: 1. Stable appearance of T2 hyperintense lesions of the white matter
of the cerebral hemispheres. While the distribution pattern of the
lesions is nonspecific, given positive [R1] and family history,
CADASIL is a strong consideration. Differential diagnosis include
demyelinating, autoimmune disease and post inflammatory/infectious
process.
2. Unremarkable MR angiogram of the brain.

## 2021-10-28 MED ORDER — GADOBUTROL 1 MMOL/ML IV SOLN
10.0000 mL | Freq: Once | INTRAVENOUS | Status: AC | PRN
  Administered 2021-10-28: 10 mL via INTRAVENOUS

## 2021-11-13 ENCOUNTER — Encounter: Payer: Self-pay | Admitting: Neurology

## 2021-11-13 ENCOUNTER — Ambulatory Visit: Payer: No Typology Code available for payment source | Admitting: Neurology

## 2021-11-13 VITALS — BP 151/93 | HR 71 | Ht 72.0 in | Wt 271.0 lb

## 2021-11-13 DIAGNOSIS — I67850 Cerebral autosomal dominant arteriopathy with subcortical infarcts and leukoencephalopathy: Secondary | ICD-10-CM | POA: Diagnosis not present

## 2021-11-13 DIAGNOSIS — R9082 White matter disease, unspecified: Secondary | ICD-10-CM

## 2021-11-13 NOTE — Progress Notes (Signed)
? ?[facility] ? ?PATIENT: Michael Moon ?DOB: Jun 20, 1996 ? ?[place] PCP: Shawnie Dapper MD ?SOURCE: Patient, notes from primary care, notes from Drs. Rueben Bash (Neurology) ? ?_________________________________ ? ? ?HISTORICAL ? ?CHIEF COMPLAINT:  ?Chief Complaint  ?Patient presents with  ? Follow-up  ?  Rm 2, alone. Here to f/u from recent MRI's.   ? ? 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    ? ? ?Update 11/13/2021: ? ? ? ? year old man who had an episode of left facial and hand numbness.   He had an MRI performed and a brain MRI was performed showing multiple non-enhancing lesions in his brain, mostly in the pericallosal white matter and in the deep white matter with small periatrial confluencies.    I showed him the images.  The foci are nonspecific.  There were only a couple of periventricular foci and no foci in the infratentorial white matter.  Therefore, the pattern would not be considered classic for MS.  There were no foci in the anterior temporal lobes or in the external capsules, hence the nonspecific foci would be atypical for CADASIL.   ? ?He has a FH of CADASIL (maternal uncle now deceased in an accident).  He had headaches and a stroke prompting the diagnosis   His mother has not been tested and has no definite symptoms (no HA or memory issues) but has had mild cognitive decline.        He denies migraines and other headaches are rare.    He denies other neurologic symptoms besides the left sided numbness ? ?He was referred to Dr. Melrose Nakayama in [place] who ordered an LP and C-spine MRI There were no OCB and normal IgG Index.   Protein was mildly elevated in the CSF.  Sed rate was mildly elevated at 42.  CRP was normal.  Lyme serology was normal.  Cervical spine shows minimal bulges and is otherwise normal.     He was referred to Dr. Tomi Likens who ordered a thoracic spine MRI, SSA/SSB and ANCA, all normal.   ? ?When his mother delivered  him, she had a fever of 103 F.  He has no history of significant trauma. ? ?He has Crohn's Disease and is on mesalamine and mercaptopurine and omeprazole.   ? ?Imaging: ?MRI of the brain 03/24/2021 shows multiple non-enhancing lesions in his brain, mostly in the pericallosal white matter and in the deep white matter with small periatrial confluencies.     The foci are nonspecific.  There were only a couple of periventricular foci and no foci in the infratentorial white matter. There were no foci in the anterior temporal lobes or in the external capsules.  Characteristics are not classic for MS or for CADASIL. ? ?MRI of the cervical spine 03/24/2021 showed a normal spinal cord.  Had a couple of very small disc bulges that would not be symptomatic ? ?MRI of the thoracic spine 07/18/2021 was normal. ? ?REVIEW OF SYSTEMS: ?Constitutional: No fevers, chills, sweats, or change in appetite ?Eyes: No visual changes, double vision, eye pain ?Ear, nose and throat: No hearing loss, ear pain, nasal congestion, sore throat ?Cardiovascular: No chest pain, palpitations ?Respiratory:  No shortness of breath at rest or with exertion.   No wheezes ?GastrointestinaI: He has Crohn's disease  ?genitourinary:  No dysuria, urinary retention or frequency.  No nocturia. ?Musculoskeletal:  No neck pain, back pain ?Integumentary: No rash, pruritus, skin lesions ?Neurological:  as above ?Psychiatric: No depression at this time.  No anxiety ?Endocrine: No palpitations, diaphoresis, change in appetite, change in weigh or increased thirst ?Hematologic/Lymphatic:  No anemia, purpura, petechiae. ?Allergic/Immunologic: No itchy/runny eyes, nasal congestion, recent allergic reactions, rashes ? ?ALLERGIES: ?Allergies  ?Allergen Reactions  ? Codeine Rash  ? ? ?HOME MEDICATIONS: ? ?Current Outpatient Medications:  ?  acetaminophen (TYLENOL) 500 MG tablet, Take 1,000 mg by mouth every 6 (six) hours as needed for moderate pain., Disp: , Rfl:  ?  aspirin EC 81  MG tablet, Take 81 mg by mouth daily. Swallow whole., Disp: , Rfl:  ?  cetirizine (ZYRTEC) 10 MG tablet, Take 10 mg by mouth daily as needed for allergies., Disp: , Rfl:  ?  famotidine (PEPCID) 10 MG tablet, Take 10 mg by mouth daily as needed for heartburn or indigestion., Disp: , Rfl:  ?  mercaptopurine (PURINETHOL) 50 MG tablet, Take 2 tablets (100 mg total) by mouth daily. Give on an empty stomach 1 hour before or 2 hours after meals. Caution: Chemotherapy., Disp: 180 tablet, Rfl: 0 ?  mesalamine (LIALDA) 1.2 g EC tablet, TAKE 4 TABLETS BY MOUTH DAILY WITH BREAKFAST., Disp: 360 tablet, Rfl: 3 ?  omeprazole (PRILOSEC) 40 MG capsule, TAKE 1 CAPSULE BY MOUTH DAILY., Disp: 90 capsule, Rfl: 3 ?  rosuvastatin (CRESTOR) 10 MG tablet, Take 1 tablet (10 mg total) by mouth daily., Disp: 90 tablet, Rfl: 3 ? ?PAST MEDICAL HISTORY: ?Past Medical History:  ?Diagnosis Date  ? Abnormal brain MRI 03/2021  ? 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 MS, ? CADASIL-advanced blood testing and LP planned by neurology as of 03/2021, plus referral to [place] [facility].  ? Anal fissure   ? Anemia   ? CADASIL   ? COVID-19 virus infection 05/10/2021  ? paxlovid  ? Crohn disease ([place]) dx 2012  ? Doing well on lialda and mercaptopurine as of 01/2018 (Dr. Carlean Purl). 03/2019 colonoscopy->mild active colitis. Recall 1 yr.  ? Fundic gland polyps of stomach, benign 10/2020  ? GERD (gastroesophageal reflux disease)   ? Hx of adenomatous colonic polyps 03/18/2019  ? Hyperlipemia, mixed 2019,2020  ? TLC  ? Obesity, Class I, BMI 30-34.9   ? Paresthesias   ? 2022->L side of face, L hand and foot.  Lyme neg. See abnormal MRI in [place] section.  ? Seasonal allergic rhinitis   ? ? ?PAST SURGICAL HISTORY: ?Past Surgical History:  ?Procedure Laterality Date  ? COLONOSCOPY  2012;03/16/2019; 10/2020  ? 2012 crohns. 03/2019->2 adenomas, mild active colitis, otherwise in remission. 10/2020-normal.  ? ESOPHAGOGASTRODUODENOSCOPY  2012  ?  2012.  10/26/20->mild acute and chronic esophagitis (eosin neg)  ? TONSILLECTOMY AND ADENOIDECTOMY  08/13/2005  ? ? ?FAMILY HISTORY: ?Family History  ?Problem Relation Age of Onset  ? Arthritis Mother   ? Hypertension Mother   ? Brain cancer Father   ? Hyperlipidemia Father   ? Hypertension Father   ? Barrett's esophagus Father   ?     last EGD showed high-grade dysplasia  ? Arthritis Maternal Grandmother   ? Diabetes Maternal Grandmother   ? Hypertension Maternal Grandmother   ? Stroke Maternal Grandmother   ? Stroke Maternal Grandfather   ? Hearing loss Maternal Grandfather   ? Alcohol abuse Maternal Grandfather   ? Alcohol abuse Paternal Grandmother   ? Hypertension Paternal Grandmother   ? Alcohol abuse Paternal Grandfather   ? Early death Paternal Grandfather   ?     unknown cause  ?  Rectal cancer Neg Hx   ? Colon cancer Neg Hx   ? Esophageal cancer Neg Hx   ? Stomach cancer Neg Hx   ? ? ?SOCIAL HISTORY: ? ?Social History  ? ?Socioeconomic History  ? Marital status: Single  ?  Spouse name: Not on file  ? Number of children: 0  ? Years of education: Not on file  ? Highest education level: Not on file  ?Occupational History  ? Occupation: MRI Editor, commissioning  ?  Employer: [employer]  ?Tobacco Use  ? Smoking status: Never  ? Smokeless tobacco: Never  ?Vaping Use  ? Vaping Use: Never used  ?Substance and Sexual Activity  ? Alcohol use: No  ? Drug use: No  ? Sexual activity: Not on file  ?Other Topics Concern  ? Not on file  ?Social History Narrative  ? Single, no children.  ? Educ: Associate degree  ? 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  ? No T/A/Ds.  ? Lives with mom in [place]  ? Right handed  ? Caffeine use: coffee 3-4 times per week, sweet tea sometimes, little soda  ? ?Social Determinants of Health  ? ?Financial Resource Strain: Not on file  ?Food Insecurity: Not on file  ?Transportation Needs: Not on file  ?Physical Activity: Not on file  ?Stress: Not on file  ?Social Connections: Not on file   ?Intimate Partner Violence: Not on file  ? ? ? ?PHYSICAL EXAM ? ?Vitals:  ? 11/13/21 1014  ?BP: (!) 151/93  ?Pulse: 71  ?Weight: 271 lb (122.9 kg)  ?Height: 6' (1.829 m)  ? ? ?Body mass index is 36.75 kg/m?Marland Kitchen

## 2021-11-13 NOTE — Patient Instructions (Signed)
[email or web address]?cond=Cadasil&draw=3&rank=13 ?NIH   Natural History Study of CADASIL ? ?Contacts ?Contact: Wonda Cheng [phone] [email or web address]  ?Contact: Theodis Aguas, M.D. [phone] [email or web address] ? ? ?____________________________________________________ ? ? ?[email or web address] ?[place] Registry for CADASIL ? ? ?Contact: CADASIL Consortium [phone] [email or web address]  ?

## 2021-11-28 ENCOUNTER — Other Ambulatory Visit (INDEPENDENT_AMBULATORY_CARE_PROVIDER_SITE_OTHER): Payer: No Typology Code available for payment source

## 2021-11-28 ENCOUNTER — Telehealth: Payer: Self-pay | Admitting: Internal Medicine

## 2021-11-28 ENCOUNTER — Other Ambulatory Visit: Payer: Self-pay

## 2021-11-28 DIAGNOSIS — K50118 Crohn's disease of large intestine with other complication: Secondary | ICD-10-CM

## 2021-11-28 DIAGNOSIS — R197 Diarrhea, unspecified: Secondary | ICD-10-CM

## 2021-11-28 DIAGNOSIS — R194 Change in bowel habit: Secondary | ICD-10-CM

## 2021-11-28 LAB — CBC WITH DIFFERENTIAL/PLATELET
Basophils Absolute: 0.1 10*3/uL (ref 0.0–0.1)
Basophils Relative: 0.9 % (ref 0.0–3.0)
Eosinophils Absolute: 0.1 10*3/uL (ref 0.0–0.7)
Eosinophils Relative: 1.2 % (ref 0.0–5.0)
HCT: 42.3 % (ref 39.0–52.0)
Hemoglobin: 14.3 g/dL (ref 13.0–17.0)
Lymphocytes Relative: 24.7 % (ref 12.0–46.0)
Lymphs Abs: 1.4 10*3/uL (ref 0.7–4.0)
MCHC: 33.9 g/dL (ref 30.0–36.0)
MCV: 85.6 fl (ref 78.0–100.0)
Monocytes Absolute: 0.3 10*3/uL (ref 0.1–1.0)
Monocytes Relative: 5.4 % (ref 3.0–12.0)
Neutro Abs: 3.8 10*3/uL (ref 1.4–7.7)
Neutrophils Relative %: 67.8 % (ref 43.0–77.0)
Platelets: 356 10*3/uL (ref 150.0–400.0)
RBC: 4.94 Mil/uL (ref 4.22–5.81)
RDW: 15.4 % (ref 11.5–15.5)
WBC: 5.6 10*3/uL (ref 4.0–10.5)

## 2021-11-28 LAB — COMPREHENSIVE METABOLIC PANEL
ALT: 31 U/L (ref 0–53)
AST: 25 U/L (ref 0–37)
Albumin: 4.7 g/dL (ref 3.5–5.2)
Alkaline Phosphatase: 63 U/L (ref 39–117)
BUN: 11 mg/dL (ref 6–23)
CO2: 30 mEq/L (ref 19–32)
Calcium: 9.6 mg/dL (ref 8.4–10.5)
Chloride: 101 mEq/L (ref 96–112)
Creatinine, Ser: 0.91 mg/dL (ref 0.40–1.50)
GFR: 115.09 mL/min (ref >60.00)
Glucose, Bld: 83 mg/dL (ref 70–99)
Potassium: 3.4 mEq/L — ABNORMAL LOW (ref 3.5–5.1)
Sodium: 140 mEq/L (ref 135–145)
Total Bilirubin: 0.4 mg/dL (ref 0.2–1.2)
Total Protein: 7.9 g/dL (ref 6.0–8.3)

## 2021-11-28 LAB — C-REACTIVE PROTEIN: CRP: 1.7 mg/dL (ref 0.5–20.0)

## 2021-11-28 LAB — SEDIMENTATION RATE: Sed Rate: 32 mm/hr — ABNORMAL HIGH (ref 0–15)

## 2021-11-28 NOTE — Telephone Encounter (Signed)
Inbound call from patient reports he have had watery stool for about 5 days. He have been taking imodium and Pepto to help temporary  ?

## 2021-11-28 NOTE — Telephone Encounter (Signed)
Reviewed the patient's chart. ?Patient has history of Crohn's disease as well as possible irritable bowel syndrome. ?We would plan stool studies to better define an infectious issue that could require other therapies. ?Please send stool GI pathogen panel, fecal calprotectin, fecal elastase. ?He may take Imodium 4 mg at first dose in morning and use up to 10 mg/day.  How much is he currently using of Imodium? ?If he feels he is having significant issues and wants to have some blood work to ensure things are okay then I would also have him do a CBC/CMP/ESR/CRP. ?Thanks. ?GM ? ?

## 2021-11-28 NOTE — Telephone Encounter (Signed)
Dr. Carlean Purl Pt ?Pt stated that starting last Thursday that he started having diarrhea: No N/V, nor Pain. Pt stated that starting Sunday and Monday that he had a low grade temp: 99.7; No Fever today; Pt states that he is currently having 2 to 3 Diarrhea stools a day along with being bloated and gassy; Pt questions if this is a "stomach Flu or his Crohn's" . Pt stated that he is taking Imodium with only temporally relief: ?Please advise as DOD ? ?

## 2021-11-28 NOTE — Telephone Encounter (Signed)
Pt was made aware of Dr. Rush Landmark recommendations: Pt states that he has been taking 6 mg of Imodium daily: ?Pt stated the would request the stool labs along with the Blood work labs: Orders placed in Epic: ?Pt given location to our Lab: Pt made aware. ?Pt verbalized understanding with all questions answered.  ? ? ?

## 2021-11-29 ENCOUNTER — Other Ambulatory Visit (HOSPITAL_BASED_OUTPATIENT_CLINIC_OR_DEPARTMENT_OTHER): Payer: Self-pay

## 2021-11-29 ENCOUNTER — Other Ambulatory Visit: Payer: Self-pay

## 2021-11-29 DIAGNOSIS — E876 Hypokalemia: Secondary | ICD-10-CM

## 2021-11-29 MED ORDER — POTASSIUM CHLORIDE CRYS ER 20 MEQ PO TBCR
20.0000 meq | EXTENDED_RELEASE_TABLET | Freq: Every day | ORAL | 0 refills | Status: DC
  Filled 2021-11-29: qty 14, 14d supply, fill #0

## 2021-11-30 ENCOUNTER — Other Ambulatory Visit: Payer: Self-pay

## 2021-11-30 ENCOUNTER — Other Ambulatory Visit (HOSPITAL_BASED_OUTPATIENT_CLINIC_OR_DEPARTMENT_OTHER): Payer: Self-pay

## 2021-11-30 DIAGNOSIS — E876 Hypokalemia: Secondary | ICD-10-CM

## 2021-11-30 MED ORDER — POTASSIUM CHLORIDE CRYS ER 20 MEQ PO TBCR
20.0000 meq | EXTENDED_RELEASE_TABLET | Freq: Every day | ORAL | 0 refills | Status: DC
  Filled 2021-11-30 (×2): qty 14, 14d supply, fill #0

## 2021-12-03 LAB — GI PROFILE, STOOL, PCR

## 2021-12-04 ENCOUNTER — Telehealth: Payer: Self-pay | Admitting: Gastroenterology

## 2021-12-04 ENCOUNTER — Encounter: Payer: Self-pay | Admitting: Family Medicine

## 2021-12-04 LAB — CALPROTECTIN, FECAL: Calprotectin, Fecal: 40 ug/g (ref 0–120)

## 2021-12-04 NOTE — Telephone Encounter (Signed)
Left message for pt to call back  °

## 2021-12-04 NOTE — Telephone Encounter (Signed)
Patient is returning your call.  

## 2021-12-05 NOTE — Telephone Encounter (Signed)
Left message for pt to call back  °

## 2021-12-05 NOTE — Telephone Encounter (Signed)
Patient is returning your call. Patient states, he can be contacted on mychart as well.  ?

## 2021-12-06 NOTE — Telephone Encounter (Signed)
My Chart message sent to pt in regard to recent Lab results and Dr. Carlean Purl recommendation:  ?

## 2021-12-07 ENCOUNTER — Ambulatory Visit: Payer: No Typology Code available for payment source | Admitting: Internal Medicine

## 2021-12-07 ENCOUNTER — Encounter: Payer: Self-pay | Admitting: Internal Medicine

## 2021-12-07 VITALS — BP 108/84 | HR 85 | Ht 72.0 in | Wt 268.4 lb

## 2021-12-07 DIAGNOSIS — E876 Hypokalemia: Secondary | ICD-10-CM

## 2021-12-07 DIAGNOSIS — A084 Viral intestinal infection, unspecified: Secondary | ICD-10-CM | POA: Diagnosis not present

## 2021-12-07 DIAGNOSIS — K5010 Crohn's disease of large intestine without complications: Secondary | ICD-10-CM

## 2021-12-07 DIAGNOSIS — Z7960 Long term (current) use of unspecified immunomodulators and immunosuppressants: Secondary | ICD-10-CM | POA: Diagnosis not present

## 2021-12-07 LAB — PANCREATIC ELASTASE, FECAL: Pancreatic Elastase-1, Stool: >500 mcg/g

## 2021-12-07 NOTE — Patient Instructions (Addendum)
If you are age 28 or younger, your body mass index should be between 19-25. Your Body mass index is 36.4 kg/m?Marland Kitchen If this is out of the aformentioned range listed, please consider follow up with your Primary Care Provider.  ?________________________________________________________ ? ?The [facility] providers would like to encourage you to use [facility] to communicate with providers for non-urgent requests or questions.  Due to long hold times on the telephone, sending your provider a message by [facility] may be a faster and more efficient way to get a response.  Please allow 48 business hours for a response.  Please remember that this is for non-urgent requests.  ?_______________________________________________________ ? ?Get your FMLA paperwork to [place] to complete. ? ?Your provider has requested that you go to the basement level for lab work in August 2023.  Press "B" on the elevator. The lab is located at the first door on the left as you exit the elevator. ? ?Due to recent changes in healthcare laws, you may see the results of your imaging and laboratory studies on MyChart before your provider has had a chance to review them.  We understand that in some cases there may be results that are confusing or concerning to you. Not all laboratory results come back in the same time frame and the provider may be waiting for multiple results in order to interpret others.  Please give [place] 48 hours in order for your provider to thoroughly review all the results before contacting the office for clarification of your results.  ? ?Thank you for entrusting me with your care and choosing [facility]. ? ?Dr Carlean Purl ? ?

## 2021-12-07 NOTE — Progress Notes (Signed)
? ?Michael Moon 26 y.o. 22-Sep-1995 [id number] ? ?Assessment & Plan:  ? ?Encounter Diagnoses  ?Name Primary?  ? Viral enteritis Yes  ? Crohn's disease of colon without complication ([place])   ? Hypokalemia   ? Long-term use of immunosuppressant medication - 6 mercaptopurine   ? ?No additional intervention is necessary.  He will continue his current treatment for his Crohn's disease.  He needs routine LFTs and CBC because of 6-MP therapy in August. ? ?Return to see me 1 year approximately sooner if needed.  We anticipate a repeat colonoscopy in 2024. ? ? ? ? ?Subjective:  ? ?Chief Complaint: Follow-up of diarrhea ? ?HPI ?26 year old white man with a history of Crohn's colitis on 6-MP and Lialda who developed diarrhea and had stool studies and calprotectin ordered.  He ended up having astrovirus, up on his GI pathogen panel-PCR.  Calprotectin was normal.  He had CBC and CMET that were okay other than slight hypokalemia that was treated for 2 weeks.  He feels completely well now. ?Allergies  ?Allergen Reactions  ? Codeine Rash  ? ?Current Meds  ?Medication Sig  ? acetaminophen (TYLENOL) 500 MG tablet Take 1,000 mg by mouth every 6 (six) hours as needed for moderate pain.  ? aspirin EC 81 MG tablet Take 81 mg by mouth daily. Swallow whole.  ? cetirizine (ZYRTEC) 10 MG tablet Take 10 mg by mouth daily as needed for allergies.  ? famotidine (PEPCID) 10 MG tablet Take 10 mg by mouth daily as needed for heartburn or indigestion.  ? mercaptopurine (PURINETHOL) 50 MG tablet Take 2 tablets (100 mg total) by mouth daily. Give on an empty stomach 1 hour before or 2 hours after meals. Caution: Chemotherapy.  ? mesalamine (LIALDA) 1.2 g EC tablet TAKE 4 TABLETS BY MOUTH DAILY WITH BREAKFAST.  ? omeprazole (PRILOSEC) 40 MG capsule TAKE 1 CAPSULE BY MOUTH DAILY.  ? potassium chloride SA (KLOR-CON M) 20 MEQ tablet Take 1 tablet (20 mEq total) by mouth daily for 14 days.  ? rosuvastatin (CRESTOR) 10 MG tablet Take 1 tablet (10 mg  total) by mouth daily.  ? ?Past Medical History:  ?Diagnosis Date  ? Abnormal brain MRI 03/2021  ? 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 MS, ? CADASIL-advanced blood testing and LP planned by neurology as of 03/2021, plus referral to [place] [facility].  ? Anal fissure   ? Anemia   ? CADASIL   ? 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 COVID-19 virus infection 05/10/2021  ? paxlovid  ? Crohn disease ([place]) dx 2012  ? Doing well on lialda and mercaptopurine as of 01/2018 (Dr. Carlean Purl). 03/2019 colonoscopy->mild active colitis. Recall 1 yr.  ? Fundic gland polyps of stomach, benign 10/2020  ? GERD (gastroesophageal reflux disease)   ? Hx of adenomatous colonic polyps 03/18/2019  ? Hyperlipemia, mixed 2019,2020  ? TLC  ? Obesity, Class I, BMI 30-34.9   ? Paresthesias   ? 2022->L side of face, L hand and foot.  Lyme neg. See abnormal MRI in [place] section.  ? Seasonal allergic rhinitis   ? ?Past Surgical History:  ?Procedure Laterality Date  ? COLONOSCOPY  2012;03/16/2019; 10/2020  ? 2012 crohns. 03/2019->2 adenomas, mild active colitis, otherwise in remission. 10/2020-normal.  ? ESOPHAGOGASTRODUODENOSCOPY  2012  ? 2012.  10/26/20->mild acute and chronic esophagitis (eosin neg)  ? TONSILLECTOMY AND ADENOIDECTOMY  08/13/2005  ? ?Social History  ? ?Social History Narrative  ? Single, no  children.  ? Educ: Associate degree  ? 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  ? No T/A/Ds.  ? Lives with mom in [place]  ? Right handed  ? Caffeine use: coffee 3-4 times per week, sweet tea sometimes, little soda  ? ?family history includes Alcohol abuse in his maternal grandfather, paternal grandfather, and paternal grandmother; Arthritis in his maternal grandmother and mother; Barrett's esophagus in his father; Brain cancer in his father; Diabetes in his maternal grandmother; Early death in his paternal grandfather; Hearing loss in his maternal grandfather;  Hyperlipidemia in his father; Hypertension in his father, maternal grandmother, mother, and paternal grandmother; Stroke in his maternal grandfather and maternal grandmother. ? ? ?Review of Systems ?As per HPI ? ?Objective:  ? Physical Exam ?BP 108/84   Pulse 85   Ht 6' (1.829 m)   Wt 268 lb 6 oz (121.7 kg)   SpO2 98%   BMI 36.40 kg/m?  ? ? ?

## 2021-12-11 ENCOUNTER — Encounter: Payer: Self-pay | Admitting: Family Medicine

## 2021-12-11 ENCOUNTER — Ambulatory Visit (INDEPENDENT_AMBULATORY_CARE_PROVIDER_SITE_OTHER): Payer: No Typology Code available for payment source | Admitting: Family Medicine

## 2021-12-11 VITALS — BP 123/77 | HR 77 | Temp 98.9°F | Ht 74.0 in | Wt 272.2 lb

## 2021-12-11 DIAGNOSIS — E782 Mixed hyperlipidemia: Secondary | ICD-10-CM

## 2021-12-11 DIAGNOSIS — K5090 Crohn's disease, unspecified, without complications: Secondary | ICD-10-CM

## 2021-12-11 DIAGNOSIS — I67850 Cerebral autosomal dominant arteriopathy with subcortical infarcts and leukoencephalopathy: Secondary | ICD-10-CM

## 2021-12-11 DIAGNOSIS — Z0000 Encounter for general adult medical examination without abnormal findings: Secondary | ICD-10-CM | POA: Diagnosis not present

## 2021-12-11 LAB — LIPID PANEL
Cholesterol: 156 mg/dL (ref 0–200)
HDL: 56.2 mg/dL (ref >39.00)
NonHDL: 99.93
Total CHOL/HDL Ratio: 3
Triglycerides: 208 mg/dL — ABNORMAL HIGH (ref 0.0–149.0)
VLDL: 41.6 mg/dL — ABNORMAL HIGH (ref 0.0–40.0)

## 2021-12-11 LAB — TSH: TSH: 1.02 u[IU]/mL (ref 0.35–5.50)

## 2021-12-11 LAB — LDL CHOLESTEROL, DIRECT: Direct LDL: 86 mg/dL

## 2021-12-11 NOTE — Progress Notes (Signed)
Office Note ?12/11/2021 ? ?CC:  ?Chief Complaint  ?Patient presents with  ? Annual Exam  ?  Pt is fasting  ? ? ?HPI:  ?Patient is a 26 y.o. male who is here for annual health maintenance exam. ? ?Michael Moon feels well. ?He is dieting and exercising the best he can. ? 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 ?Neurology started him on rosuvastatin 10 mg a day about 3 months ago. ?He takes an aspirin daily. ? ?Past Medical History:  ?Diagnosis Date  ? Abnormal brain MRI 03/2021  ? 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 MS, ? CADASIL-advanced blood testing and LP planned by neurology as of 03/2021, plus referral to [facility].  ? Anal fissure   ? Anemia   ? 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 COVID-19 virus infection 05/10/2021  ? paxlovid  ? Crohn disease ([place]) dx 2012  ? Doing well on lialda and mercaptopurine as of 01/2018 (Dr. Carlean Purl). 03/2019 colonoscopy->mild active colitis. Recall 1 yr.  ? Fundic gland polyps of stomach, benign 10/2020  ? GERD (gastroesophageal reflux disease)   ? Hx of adenomatous colonic polyps 03/18/2019  ? Hyperlipemia, mixed 2019,2020  ? TLC  ? Obesity, Class I, BMI 30-34.9   ? Paresthesias   ? 2022->L side of face, L hand and foot.  Lyme neg. See abnormal MRI in [place] section.  ? Seasonal allergic rhinitis   ? ? ?Past Surgical History:  ?Procedure Laterality Date  ? COLONOSCOPY  2012;03/16/2019; 10/2020  ? 2012 crohns. 03/2019->2 adenomas, mild active colitis, otherwise in remission. 10/2020-normal.  ? ESOPHAGOGASTRODUODENOSCOPY  2012  ? 2012.  10/26/20->mild acute and chronic esophagitis (eosin neg)  ? TONSILLECTOMY AND ADENOIDECTOMY  08/13/2005  ? ? ?Family History  ?Problem Relation Age of Onset  ? Arthritis Mother   ? Hypertension Mother   ? Brain cancer Father   ? Hyperlipidemia Father   ? Hypertension Father   ?  Barrett's esophagus Father   ?     last EGD showed high-grade dysplasia  ? Arthritis Maternal Grandmother   ? Diabetes Maternal Grandmother   ? Hypertension Maternal Grandmother   ? Stroke Maternal Grandmother   ? Stroke Maternal Grandfather   ? Hearing loss Maternal Grandfather   ? Alcohol abuse Maternal Grandfather   ? Alcohol abuse Paternal Grandmother   ? Hypertension Paternal Grandmother   ? Alcohol abuse Paternal Grandfather   ? Early death Paternal Grandfather   ?     unknown cause  ? Rectal cancer Neg Hx   ? Colon cancer Neg Hx   ? Esophageal cancer Neg Hx   ? Stomach cancer Neg Hx   ? Pancreatic cancer Neg Hx   ? ? ?Social History  ? ?Socioeconomic History  ? Marital status: Single  ?  Spouse name: Not on file  ? Number of children: 0  ? Years of education: Not on file  ? Highest education level: Not on file  ?Occupational History  ? Occupation: MRI Editor, commissioning  ?  Employer: [employer]  ?Tobacco Use  ? Smoking status: Never  ? Smokeless tobacco: Never  ?Vaping Use  ? Vaping Use: Never used  ?Substance and Sexual Activity  ? Alcohol use: No  ? Drug use: No  ? Sexual activity: Not on file  ?Other Topics Concern  ? Not on file  ?Social History Narrative  ? Single, no children.  ?  Educ: Associate degree  ? 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  ? No T/A/Ds.  ? Lives with mom in [place]  ? Right handed  ? Caffeine use: coffee 3-4 times per week, sweet tea sometimes, little soda  ? ?Social Determinants of Health  ? ?Financial Resource Strain: Not on file  ?Food Insecurity: Not on file  ?Transportation Needs: Not on file  ?Physical Activity: Not on file  ?Stress: Not on file  ?Social Connections: Not on file  ?Intimate Partner Violence: Not on file  ? ? ?Outpatient Medications Prior to Visit  ?Medication Sig Dispense Refill  ? acetaminophen (TYLENOL) 500 MG tablet Take 1,000 mg by mouth every 6 (six) hours as needed for moderate pain.    ? aspirin EC 81 MG tablet Take 81 mg by mouth daily. Swallow  whole.    ? cetirizine (ZYRTEC) 10 MG tablet Take 10 mg by mouth daily as needed for allergies.    ? famotidine (PEPCID) 10 MG tablet Take 10 mg by mouth daily as needed for heartburn or indigestion.    ? mercaptopurine (PURINETHOL) 50 MG tablet Take 2 tablets (100 mg total) by mouth daily. Give on an empty stomach 1 hour before or 2 hours after meals. Caution: Chemotherapy. 180 tablet 0  ? mesalamine (LIALDA) 1.2 g EC tablet TAKE 4 TABLETS BY MOUTH DAILY WITH BREAKFAST. 360 tablet 3  ? omeprazole (PRILOSEC) 40 MG capsule TAKE 1 CAPSULE BY MOUTH DAILY. 90 capsule 3  ? potassium chloride SA (KLOR-CON M) 20 MEQ tablet Take 1 tablet (20 mEq total) by mouth daily for 14 days. 14 tablet 0  ? rosuvastatin (CRESTOR) 10 MG tablet Take 1 tablet (10 mg total) by mouth daily. 90 tablet 3  ? ?No facility-administered medications prior to visit.  ? ? ?Allergies  ?Allergen Reactions  ? Codeine Rash  ? ? ?ROS ?Review of Systems  ?Constitutional:  Negative for appetite change, chills, fatigue and fever.  ?HENT:  Negative for congestion, dental problem, ear pain and sore throat.   ?Eyes:  Negative for discharge, redness and visual disturbance.  ?Respiratory:  Negative for cough, chest tightness, shortness of breath and wheezing.   ?Cardiovascular:  Negative for chest pain, palpitations and leg swelling.  ?Gastrointestinal:  Negative for abdominal pain, blood in stool, diarrhea, nausea and vomiting.  ?Genitourinary:  Negative for difficulty urinating, dysuria, flank pain, frequency, hematuria and urgency.  ?Musculoskeletal:  Negative for arthralgias, back pain, joint swelling, myalgias and neck stiffness.  ?Skin:  Negative for pallor and rash.  ?Neurological:  Negative for dizziness, speech difficulty, weakness and headaches.  ?Hematological:  Negative for adenopathy. Does not bruise/bleed easily.  ?Psychiatric/Behavioral:  Negative for confusion and sleep disturbance. The patient is not nervous/anxious.   ? ?PE; ? ?  12/11/2021  ?   9:55 AM 12/07/2021  ?  3:08 PM 11/13/2021  ? 10:14 AM  ?Vitals with BMI  ?Height '6\' 2"'$  '6\' 0"'$  '6\' 0"'$   ?Weight 272 lbs 3 oz 268 lbs 6 oz 271 lbs  ?BMI 34.93 36.39 36.75  ?Systolic 376 283 151  ?Diastolic 77 84 93  ?Pulse 77 85 71  ? ?Gen: Alert, well appearing.  Patient is oriented to person, place, time, and situation. ?AFFECT: pleasant, lucid thought and speech. ?ENT: Ears: EACs clear, normal epithelium.  TMs with good light reflex and landmarks bilaterally.  Eyes: no injection, icteris, swelling, or exudate.  EOMI, PERRLA. ?Nose: no drainage or turbinate edema/swelling.  No injection or focal lesion.  Mouth:  lips without lesion/swelling.  Oral mucosa pink and moist.  Dentition intact and without obvious caries or gingival swelling.  Oropharynx without erythema, exudate, or swelling.  ?Neck: supple/nontender.  No LAD, mass, or TM.  Carotid pulses 2+ bilaterally, without bruits. ?CV: RRR, no m/r/g.   ?LUNGS: CTA bilat, nonlabored resps, good aeration in all lung fields. ?ABD: soft, NT, ND, BS normal.  No hepatospenomegaly or mass.  No bruits. ?EXT: no clubbing, cyanosis, or edema.  ?Musculoskeletal: no joint swelling, erythema, warmth, or tenderness.  ROM of all joints intact. ?Skin - no sores or suspicious lesions or rashes or color changes ? ? ?Pertinent labs:  ?Lab Results  ?Component Value Date  ? TSH 0.79 02/02/2021  ? ?Lab Results  ?Component Value Date  ? WBC 5.6 11/28/2021  ? HGB 14.3 11/28/2021  ? HCT 42.3 11/28/2021  ? MCV 85.6 11/28/2021  ? PLT 356.0 11/28/2021  ? ?Lab Results  ?Component Value Date  ? CREATININE 0.91 11/28/2021  ? BUN 11 11/28/2021  ? NA 140 11/28/2021  ? K 3.4 (L) 11/28/2021  ? CL 101 11/28/2021  ? CO2 30 11/28/2021  ? ?Lab Results  ?Component Value Date  ? ALT 31 11/28/2021  ? AST 25 11/28/2021  ? ALKPHOS 63 11/28/2021  ? BILITOT 0.4 11/28/2021  ? ?Lab Results  ?Component Value Date  ? CHOL 215 (H) 02/02/2021  ? ?Lab Results  ?Component Value Date  ? HDL 41.80 02/02/2021  ? ?Lab Results   ?Component Value Date  ? LDLCALC 127 (H) 02/01/2020  ? ?Lab Results  ?Component Value Date  ? TRIG 247.0 (H) 02/02/2021  ? ?Lab Results  ?Component Value Date  ? CHOLHDL 5 02/02/2021  ? ?ASSESSMENT AND

## 2021-12-11 NOTE — Patient Instructions (Signed)
Health Maintenance, Male Adopting a healthy lifestyle and getting preventive care are important in promoting health and wellness. Ask your health care provider about: The right schedule for you to have regular tests and exams. Things you can do on your own to prevent diseases and keep yourself healthy. What should I know about diet, weight, and exercise? Eat a healthy diet  Eat a diet that includes plenty of vegetables, fruits, low-fat dairy products, and lean protein. Do not eat a lot of foods that are high in solid fats, added sugars, or sodium. Maintain a healthy weight Body mass index (BMI) is a measurement that can be used to identify possible weight problems. It estimates body fat based on height and weight. Your health care provider can help determine your BMI and help you achieve or maintain a healthy weight. Get regular exercise Get regular exercise. This is one of the most important things you can do for your health. Most adults should: Exercise for at least 150 minutes each week. The exercise should increase your heart rate and make you sweat (moderate-intensity exercise). Do strengthening exercises at least twice a week. This is in addition to the moderate-intensity exercise. Spend less time sitting. Even light physical activity can be beneficial. Watch cholesterol and blood lipids Have your blood tested for lipids and cholesterol at 28 years of age, then have this test every 5 years. You may need to have your cholesterol levels checked more often if: Your lipid or cholesterol levels are high. You are older than 28 years of age. You are at high risk for heart disease. What should I know about cancer screening? Many types of cancers can be detected early and may often be prevented. Depending on your health history and family history, you may need to have cancer screening at various ages. This may include screening for: Colorectal cancer. Prostate cancer. Skin cancer. Lung  cancer. What should I know about heart disease, diabetes, and high blood pressure? Blood pressure and heart disease High blood pressure causes heart disease and increases the risk of stroke. This is more likely to develop in people who have high blood pressure readings or are overweight. Talk with your health care provider about your target blood pressure readings. Have your blood pressure checked: Every 3-5 years if you are 18-39 years of age. Every year if you are 40 years old or older. If you are between the ages of 65 and 75 and are a current or former smoker, ask your health care provider if you should have a one-time screening for abdominal aortic aneurysm (AAA). Diabetes Have regular diabetes screenings. This checks your fasting blood sugar level. Have the screening done: Once every three years after age 45 if you are at a normal weight and have a low risk for diabetes. More often and at a younger age if you are overweight or have a high risk for diabetes. What should I know about preventing infection? Hepatitis B If you have a higher risk for hepatitis B, you should be screened for this virus. Talk with your health care provider to find out if you are at risk for hepatitis B infection. Hepatitis C Blood testing is recommended for: Everyone born from 1945 through 1965. Anyone with known risk factors for hepatitis C. Sexually transmitted infections (STIs) You should be screened each year for STIs, including gonorrhea and chlamydia, if: You are sexually active and are younger than 28 years of age. You are older than 28 years of age and your   health care provider tells you that you are at risk for this type of infection. Your sexual activity has changed since you were last screened, and you are at increased risk for chlamydia or gonorrhea. Ask your health care provider if you are at risk. Ask your health care provider about whether you are at high risk for HIV. Your health care provider  may recommend a prescription medicine to help prevent HIV infection. If you choose to take medicine to prevent HIV, you should first get tested for HIV. You should then be tested every 3 months for as long as you are taking the medicine. Follow these instructions at home: Alcohol use Do not drink alcohol if your health care provider tells you not to drink. If you drink alcohol: Limit how much you have to 0-2 drinks a day. Know how much alcohol is in your drink. In the [place] one drink equals one 12 oz bottle of beer (355 mL), one 5 oz glass of wine (148 mL), or one 1 oz glass of hard liquor (44 mL). Lifestyle Do not use any products that contain nicotine or tobacco. These products include cigarettes, chewing tobacco, and vaping devices, such as e-cigarettes. If you need help quitting, ask your health care provider. Do not use street drugs. Do not share needles. Ask your health care provider for help if you need support or information about quitting drugs. General instructions Schedule regular health, dental, and eye exams. Stay current with your vaccines. Tell your health care provider if: You often feel depressed. You have ever been abused or do not feel safe at home. Summary Adopting a healthy lifestyle and getting preventive care are important in promoting health and wellness. Follow your health care provider's instructions about healthy diet, exercising, and getting tested or screened for diseases. Follow your health care provider's instructions on monitoring your cholesterol and blood pressure. This information is not intended to replace advice given to you by your health care provider. Make sure you discuss any questions you have with your health care provider. Document Revised: 12/19/2020 Document Reviewed: 12/19/2020 Elsevier Patient Education  2023 [employer] Inc[employer]  

## 2021-12-13 ENCOUNTER — Other Ambulatory Visit: Payer: Self-pay

## 2021-12-13 ENCOUNTER — Telehealth: Payer: Self-pay

## 2021-12-13 DIAGNOSIS — I67850 Cerebral autosomal dominant arteriopathy with subcortical infarcts and leukoencephalopathy: Secondary | ICD-10-CM

## 2021-12-13 DIAGNOSIS — E782 Mixed hyperlipidemia: Secondary | ICD-10-CM

## 2021-12-13 MED ORDER — ROSUVASTATIN CALCIUM 20 MG PO TABS
20.0000 mg | ORAL_TABLET | Freq: Every day | ORAL | 2 refills | Status: DC
  Filled 2021-12-13: qty 30, 30d supply, fill #0
  Filled 2022-01-09: qty 60, 60d supply, fill #1

## 2021-12-13 NOTE — Telephone Encounter (Signed)
-----   Message from Tammi Sou, MD sent at 12/12/2021  9:50 AM EDT ----- ?LDL cholesterol is improved but not below his goal of 70. ?Triglycerides improved but still mildly elevated. ?Increase crestor to '20mg'$  daily.  Pls do rx for generic crestor '20mg'$ , 1 tab po qd, #30, RF x 2.  Lab for fasting lipids and AST/ALT in 2-3 mo, dx mixed hyperlipidemia and CADASIL. ?All other labs were normal. ? ?

## 2021-12-14 ENCOUNTER — Telehealth: Payer: Self-pay

## 2021-12-14 NOTE — Telephone Encounter (Signed)
We have received FMLA paperwork and will complete this and inform Leodan. I have left him a voice mail with some questions about the form and told him to call me back. ?

## 2021-12-15 NOTE — Telephone Encounter (Signed)
I have Elrosa and I let him know that the form has been completed , faxed and I will mail him a copy. ?

## 2021-12-21 ENCOUNTER — Other Ambulatory Visit: Payer: Self-pay

## 2021-12-21 ENCOUNTER — Telehealth: Payer: No Typology Code available for payment source | Admitting: Family Medicine

## 2021-12-21 DIAGNOSIS — B9689 Other specified bacterial agents as the cause of diseases classified elsewhere: Secondary | ICD-10-CM

## 2021-12-21 DIAGNOSIS — J019 Acute sinusitis, unspecified: Secondary | ICD-10-CM

## 2021-12-21 MED ORDER — AMOXICILLIN-POT CLAVULANATE 875-125 MG PO TABS
1.0000 | ORAL_TABLET | Freq: Two times a day (BID) | ORAL | 0 refills | Status: AC
  Filled 2021-12-21: qty 14, 7d supply, fill #0

## 2021-12-21 NOTE — Progress Notes (Signed)

## 2022-01-09 ENCOUNTER — Other Ambulatory Visit: Payer: Self-pay

## 2022-01-10 ENCOUNTER — Other Ambulatory Visit: Payer: Self-pay

## 2022-01-30 ENCOUNTER — Other Ambulatory Visit (HOSPITAL_BASED_OUTPATIENT_CLINIC_OR_DEPARTMENT_OTHER): Payer: Self-pay

## 2022-01-30 ENCOUNTER — Emergency Department
Admission: RE | Admit: 2022-01-30 | Discharge: 2022-01-30 | Disposition: A | Discharge status: Home / Self Care | Payer: No Typology Code available for payment source | Source: Ambulatory Visit | Attending: Family Medicine | Admitting: Family Medicine

## 2022-01-30 VITALS — BP 125/85 | HR 109 | Temp 100.2°F | Resp 18 | Ht 72.0 in | Wt 270.0 lb

## 2022-01-30 DIAGNOSIS — J320 Chronic maxillary sinusitis: Secondary | ICD-10-CM | POA: Diagnosis not present

## 2022-01-30 LAB — POCT RAPID STREP A (OFFICE): Rapid Strep A Screen: NEGATIVE

## 2022-01-30 MED ORDER — DOXYCYCLINE HYCLATE 100 MG PO CAPS
100.0000 mg | ORAL_CAPSULE | Freq: Two times a day (BID) | ORAL | 0 refills | Status: DC
  Filled 2022-01-30: qty 30, 15d supply, fill #0

## 2022-01-30 MED ORDER — PREDNISONE 20 MG PO TABS
40.0000 mg | ORAL_TABLET | Freq: Every day | ORAL | 0 refills | Status: DC
  Filled 2022-01-30: qty 10, 5d supply, fill #0

## 2022-01-30 MED ORDER — FLUTICASONE PROPIONATE 50 MCG/ACT NA SUSP
2.0000 | Freq: Every day | NASAL | 0 refills | Status: DC
  Filled 2022-01-30: qty 16, 30d supply, fill #0

## 2022-01-30 NOTE — ED Provider Notes (Signed)
[name]    CSN: [id number] Arrival date & time: 01/30/22  1046      History   Chief Complaint Chief Complaint  Patient presents with   URI    Entered by patient   Nasal Congestion    HPI KALEE MCCLENATHAN is a 26 y.o. male.   HPI  Tayton has had chronic sinus and ear problems since a young person.  He had tonsillectomy and adenoidectomy when a child.  He is under the care of an ENT doctor as well as his pediatrician for recurring sinus infections and ear infections. As an adult he also has sinus problems.  He had a video visit with his primary care doctor approximately a month ago.  He was diagnosed with acute sinusitis.  He was given a week of Augmentin.  He said he felt slightly better but not completely well.  He is here for persistence of pain in his left cheek.  Some sinus congestion postnasal drip.  Occasional sore throat.  No coughing or chest congestion.  He feels like he has a sinus infection that has not been treated with the Augmentin. Patient had an MRI of his brain in March of this year.  I did look at the MRI and the report.  He did not have any fluid or thickening of the maxillary sinuses on that study  Past Medical History:  Diagnosis Date   Abnormal brain MRI 03/2021   Multiple white matter signal abnormalities around the lateral ventricles->? MS, ? CADASIL-advanced blood testing and LP planned by neurology as of 03/2021, plus referral to [place] [facility].   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 Carlean Purl). 03/2019 colonoscopy->mild active colitis. Recall 1 yr.   Fundic gland polyps of stomach, benign 10/2020   GERD (gastroesophageal reflux disease)    Hx of adenomatous colonic polyps 03/18/2019   Hyperlipemia, mixed 2019,2020   TLC   Obesity, Class I,  BMI 30-34.9    Paresthesias    2022->L side of face, L hand and foot.  Lyme neg. See abnormal MRI in [place] section.   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) 10/02/2021   Family history of genetic disease 07/24/2021   White matter abnormality on MRI of brain 07/24/2021   Left facial numbness 03/31/2021   Tingling 03/31/2021   Hx of adenomatous colonic polyps 03/18/2019   Class 2 obesity due to excess calories with body mass index (BMI) of 38.0 to 38.9 in adult 02/25/2019   Long-term use of immunosuppressant medication - 6 mercaptopurine 01/23/2019   Crohn's disease of colon ([place]) 11/01/2017   Iron deficiency anemia 01/03/2016   GERD (gastroesophageal reflux disease) 03/13/2012   Allergic rhinitis 08/15/2007    Past Surgical History:  Procedure Laterality Date   COLONOSCOPY  2012;03/16/2019; 10/2020   2012 crohns. 03/2019->2 adenomas, mild active colitis, otherwise in remission. 10/2020-normal.   ESOPHAGOGASTRODUODENOSCOPY  2012   2012.  10/26/20->mild acute and chronic esophagitis (eosin neg)   TONSILLECTOMY AND ADENOIDECTOMY  08/13/2005       Home Medications    Prior to Admission medications   Medication Sig Start Date End Date Taking? Authorizing  Provider  doxycycline (VIBRAMYCIN) 100 MG capsule Take 1 capsule (100 mg total) by mouth 2 (two) times daily. 01/30/22  Yes Raylene Everts, MD  fluticasone [facility]) 50 MCG/ACT nasal spray Place 2 sprays into both nostrils daily. 01/30/22  Yes Raylene Everts, MD  predniSONE (DELTASONE) 20 MG tablet Take 2 tablets (40 mg total) by mouth daily with breakfast. 01/30/22  Yes Raylene Everts, MD  acetaminophen (TYLENOL) 500 MG tablet Take 1,000 mg by mouth every 6 (six) hours as needed for moderate pain.    [provider]  aspirin EC 81 MG tablet Take 81 mg by mouth daily. Swallow  whole.    [provider]  cetirizine (ZYRTEC) 10 MG tablet Take 10 mg by mouth daily as needed for allergies.    [provider]  famotidine (PEPCID) 10 MG tablet Take 10 mg by mouth daily as needed for heartburn or indigestion.    [provider]  mercaptopurine (PURINETHOL) 50 MG tablet Take 2 tablets (100 mg total) by mouth daily. Give on an empty stomach 1 hour before or 2 hours after meals. Caution: Chemotherapy. 10/17/21   Gatha Mayer, MD  mesalamine (LIALDA) 1.2 g EC tablet TAKE 4 TABLETS BY MOUTH DAILY WITH BREAKFAST. 04/03/21 04/15/22  Gatha Mayer, MD  omeprazole (PRILOSEC) 40 MG capsule TAKE 1 CAPSULE BY MOUTH DAILY. 04/03/21 04/15/22  Gatha Mayer, MD  rosuvastatin (CRESTOR) 20 MG tablet Take 1 tablet (20 mg total) by mouth daily. 12/13/21   McGowen, Adrian Blackwater, MD    Family History Family History  Problem Relation Age of Onset   Arthritis Mother    Hypertension Mother    Brain cancer Father    Hyperlipidemia Father    Hypertension Father    Barrett's esophagus Father        last EGD showed high-grade dysplasia   Arthritis Maternal Grandmother    Diabetes Maternal Grandmother    Hypertension Maternal Grandmother    Stroke Maternal Grandmother    Stroke Maternal Grandfather    Hearing loss Maternal Grandfather    Alcohol abuse Maternal Grandfather    Alcohol abuse Paternal Grandmother    Hypertension Paternal Grandmother    Alcohol abuse Paternal Grandfather    Early death Paternal Grandfather        unknown cause   Rectal cancer Neg Hx    Colon cancer Neg Hx    Esophageal cancer Neg Hx    Stomach cancer Neg Hx    Pancreatic cancer Neg Hx     Social History Social History   Tobacco Use   Smoking status: Never   Smokeless tobacco: Never  Vaping Use   Vaping Use: Never used  Substance Use Topics   Alcohol use: No   Drug use: No     Allergies   Codeine   Review of Systems Review of Systems See HPI  Physical Exam Triage  Vital Signs ED Triage Vitals  Enc Vitals Group     BP 01/30/22 1112 125/85     Pulse Rate 01/30/22 1112 (!) 109     Resp 01/30/22 1112 18     Temp 01/30/22 1112 100.2 F (37.9 C)     Temp Source 01/30/22 1112 Oral     SpO2 01/30/22 1112 98 %     Weight 01/30/22 1116 270 lb (122.5 kg)     Height 01/30/22 1116 6' (1.829 m)     Head Circumference --      Peak Flow --  Pain Score 01/30/22 1116 5     Pain Loc --      Pain Edu? --      Excl. in [place]? --    No data found.  Updated Vital Signs BP 125/85 (BP Location: Left Arm)   Pulse (!) 109   Temp 100.2 F (37.9 C) (Oral)   Resp 18   Ht 6' (1.829 m)   Wt 122.5 kg   SpO2 98%   BMI 36.62 kg/m       Physical Exam Constitutional:      General: He is not in acute distress.    Appearance: Normal appearance. He is well-developed.  HENT:     Head: Normocephalic and atraumatic.     Right Ear: Tympanic membrane and ear canal normal.     Left Ear: Tympanic membrane and ear canal normal.     Nose: Congestion present. No rhinorrhea.     Mouth/Throat:     Mouth: Mucous membranes are moist.     Pharynx: No posterior oropharyngeal erythema.     Comments: Tenderness over left maxilla.  Teeth in good condition Eyes:     Conjunctiva/sclera: Conjunctivae normal.     Pupils: Pupils are equal, round, and reactive to light.  Cardiovascular:     Rate and Rhythm: Normal rate and regular rhythm.     Heart sounds: Normal heart sounds.  Pulmonary:     Effort: Pulmonary effort is normal. No respiratory distress.     Breath sounds: Normal breath sounds.  Abdominal:     General: There is no distension.     Palpations: Abdomen is soft.  Musculoskeletal:        General: Normal range of motion.     Cervical back: Normal range of motion.  Lymphadenopathy:     Cervical: Cervical adenopathy present.  Skin:    General: Skin is warm and dry.  Neurological:     Mental Status: He is alert.  Psychiatric:        Mood and Affect: Mood normal.         Behavior: Behavior normal.      UC Treatments / Results  Labs (all labs ordered are listed, but only abnormal results are displayed) Labs Reviewed  POCT RAPID STREP A (OFFICE)    EKG   Radiology No results found.  Procedures Procedures (including critical care time)  Medications Ordered in UC Medications - No data to display  Initial Impression / Assessment and Plan / UC Course  I have reviewed the triage vital signs and the nursing notes.  Pertinent labs & imaging results that were available during my care of the patient were reviewed by me and considered in my medical decision making (see chart for details).     Discussed that sinusitis can become chronic.  I am going to treat him with 2 to 4 weeks of antibiotics.  I am going to give him MRSA coverage.  Also I am going to use steroids to try to help promote drainage.  Increased fluids as recommended.  Follow-up with PCP Final Clinical Impressions(s) / UC Diagnoses   Final diagnoses:  Chronic maxillary sinusitis     Discharge Instructions      Take the oral prednisone daily for 5 days Use the Flonase daily as well.  Continue this for 2-4 weeks until sinus pressure resolves Continue saline nasal rinses Take the antibiotic 2 x a day WITH FOOD After the 2 weeks, if you still have sinus fullness, take 2 more  weeks If you fail to improve see PCP   ED Prescriptions     Medication Sig Dispense Auth. Provider   fluticasone (FLONASE) 50 MCG/ACT nasal spray Place 2 sprays into both nostrils daily. 16 g Raylene Everts, MD   doxycycline (VIBRAMYCIN) 100 MG capsule Take 1 capsule (100 mg total) by mouth 2 (two) times daily. 30 capsule Raylene Everts, MD   predniSONE (DELTASONE) 20 MG tablet Take 2 tablets (40 mg total) by mouth daily with breakfast. 10 tablet Raylene Everts, MD      PDMP not reviewed this encounter.   Raylene Everts, MD 01/30/22 1359

## 2022-01-30 NOTE — Discharge Instructions (Signed)
Take the oral prednisone daily for 5 days Use the Flonase daily as well.  Continue this for 2-4 weeks until sinus pressure resolves Continue saline nasal rinses Take the antibiotic 2 x a day WITH FOOD After the 2 weeks, if you still have sinus fullness, take 2 more weeks If you fail to improve see PCP

## 2022-01-30 NOTE — ED Triage Notes (Signed)
Nasal congestion, sore throat x 4 days. Reports thick green discharge from nose. Left sinus pressure. Recent Augmentin for sinus infection that he feels never completely cleared up. Taking tylenol prn.

## 2022-02-04 ENCOUNTER — Other Ambulatory Visit: Payer: Self-pay | Admitting: Internal Medicine

## 2022-02-05 ENCOUNTER — Other Ambulatory Visit: Payer: Self-pay

## 2022-02-05 MED ORDER — MERCAPTOPURINE 50 MG PO TABS
100.0000 mg | ORAL_TABLET | Freq: Every day | ORAL | 0 refills | Status: DC
  Filled 2022-02-05: qty 180, 90d supply, fill #0

## 2022-02-22 ENCOUNTER — Other Ambulatory Visit (HOSPITAL_BASED_OUTPATIENT_CLINIC_OR_DEPARTMENT_OTHER): Payer: Self-pay

## 2022-02-22 ENCOUNTER — Encounter: Payer: Self-pay | Admitting: Family Medicine

## 2022-02-22 ENCOUNTER — Ambulatory Visit (INDEPENDENT_AMBULATORY_CARE_PROVIDER_SITE_OTHER): Payer: No Typology Code available for payment source | Admitting: Family Medicine

## 2022-02-22 VITALS — BP 119/74 | HR 75 | Temp 98.5°F | Ht 72.0 in | Wt 276.6 lb

## 2022-02-22 DIAGNOSIS — J329 Chronic sinusitis, unspecified: Secondary | ICD-10-CM | POA: Diagnosis not present

## 2022-02-22 MED ORDER — CLINDAMYCIN HCL 300 MG PO CAPS
300.0000 mg | ORAL_CAPSULE | Freq: Three times a day (TID) | ORAL | 0 refills | Status: AC
  Filled 2022-02-22: qty 30, 10d supply, fill #0

## 2022-02-22 NOTE — Progress Notes (Signed)
OFFICE VISIT  02/22/2022  CC:  Chief Complaint  Patient presents with   Sinus Issues    Had a sinus infection in May and June; given abx both times. It helped to improve sxs but did not completely clear it up. Current symptoms are nasal drainage, sore throat from nasal drainage, low grade fever starting Sat. Pt did have body aches on Sat but has since resolved. Denies n/v, chills. Pt has not completed at home covid test.    Patient is a 26 y.o. male who presents for "sinus issues".  HPI: E-visit 12/21/21->acute sinusitis->augmentin. [facility] 01/30/22-->not all the way better->doxy rx'd.   Past Medical History:  Diagnosis Date   Abnormal brain MRI 03/2021   Multiple white matter signal abnormalities around the lateral ventricles->? MS, ? CADASIL-advanced blood testing and LP planned by neurology as of 03/2021, plus referral to [place] [facility].   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 Carlean Purl). 03/2019 colonoscopy->mild active colitis. Recall 1 yr.   Fundic gland polyps of stomach, benign 10/2020   GERD (gastroesophageal reflux disease)    Hx of adenomatous colonic polyps 03/18/2019   Hyperlipemia, mixed 2019,2020   TLC   Obesity, Class I, BMI 30-34.9    Paresthesias    2022->L side of face, L hand and foot.  Lyme neg. See abnormal MRI in [place] section.   Seasonal allergic rhinitis     Past Surgical History:  Procedure Laterality Date   COLONOSCOPY  2012;03/16/2019; 10/2020   2012 crohns. 03/2019->2 adenomas, mild active colitis, otherwise in remission. 10/2020-normal.   ESOPHAGOGASTRODUODENOSCOPY  2012   2012.  10/26/20->mild acute and chronic esophagitis (eosin neg)   TONSILLECTOMY AND ADENOIDECTOMY  08/13/2005    Outpatient Medications Prior to Visit  Medication Sig  Dispense Refill   acetaminophen (TYLENOL) 500 MG tablet Take 1,000 mg by mouth every 6 (six) hours as needed for moderate pain.     aspirin EC 81 MG tablet Take 81 mg by mouth daily. Swallow whole.     cetirizine (ZYRTEC) 10 MG tablet Take 10 mg by mouth daily as needed for allergies.     famotidine (PEPCID) 10 MG tablet Take 10 mg by mouth daily as needed for heartburn or indigestion.     fluticasone (FLONASE) 50 MCG/ACT nasal spray Place 2 sprays into both nostrils daily. 16 g 0   mercaptopurine (PURINETHOL) 50 MG tablet Take 2 tablets (100 mg total) by mouth daily. Give on an empty stomach 1 hour before or 2 hours after meals. Caution: Chemotherapy. 180 tablet 0   mesalamine (LIALDA) 1.2 g EC tablet TAKE 4 TABLETS BY MOUTH DAILY WITH BREAKFAST. 360 tablet 3   omeprazole (PRILOSEC) 40 MG capsule TAKE 1 CAPSULE BY MOUTH DAILY. 90 capsule 3   rosuvastatin (CRESTOR) 20 MG tablet Take 1 tablet (20 mg total) by mouth daily. 30 tablet 2   doxycycline (VIBRAMYCIN) 100 MG capsule Take 1 capsule (100 mg total) by mouth 2 (two) times daily. (Patient not taking: Reported on 02/22/2022) 30 capsule 0   predniSONE (DELTASONE) 20 MG tablet Take 2 tablets (40 mg total) by mouth daily with breakfast. (Patient not taking: Reported on 02/22/2022) 10 tablet 0   No facility-administered medications prior to visit.    Allergies  Allergen Reactions   Codeine Rash    ROS As per HPI  PE:    02/22/2022    8:30 AM 01/30/2022   11:16 AM 01/30/2022   11:12 AM  Vitals with BMI  Height '6\' 0"'$  '6\' 0"'$    Weight 276 lbs 10 oz 270 lbs   BMI 74.25 95.63   Systolic 875  643  Diastolic 74  85  Pulse 75  109     Physical Exam  Gen: Alert, well appearing.  Patient is oriented to person, place, time, and situation.` AFFECT: pleasant, lucid thought and speech. ENT: Ears: EACs clear, normal epithelium.  TMs with good light reflex and landmarks bilaterally.  Eyes: no injection, icteris, swelling, or exudate.  EOMI,  PERRLA. Nose: no drainage or turbinate edema/swelling.  No injection or focal lesion.  Mouth: lips without lesion/swelling.  Oral mucosa pink and moist.  Dentition intact and without obvious caries or gingival swelling.  Oropharynx without erythema, exudate, or swelling.  Neck - No masses or thyromegaly or limitation in range of motion CV: RRR, no m/r/g.   LUNGS: CTA bilat, nonlabored resps, good aeration in all lung fields.   LABS:  Last CBC Lab Results  Component Value Date   WBC 5.6 11/28/2021   HGB 14.3 11/28/2021   HCT 42.3 11/28/2021   MCV 85.6 11/28/2021   MCH 29.5 07/19/2021   RDW 15.4 11/28/2021   PLT 356.0 32/95/1884   Last metabolic panel Lab Results  Component Value Date   GLUCOSE 83 11/28/2021   NA 140 11/28/2021   K 3.4 (L) 11/28/2021   CL 101 11/28/2021   CO2 30 11/28/2021   BUN 11 11/28/2021   CREATININE 0.91 11/28/2021   GFRNONAA >60 07/19/2021   CALCIUM 9.6 11/28/2021   PROT 7.9 11/28/2021   ALBUMIN 4.7 11/28/2021   BILITOT 0.4 11/28/2021   ALKPHOS 63 11/28/2021   AST 25 11/28/2021   ALT 31 11/28/2021   ANIONGAP 7 07/19/2021   IMPRESSION AND PLAN:  URI--recent history of recurrent/persistent sinusitis. Current infection is less severe so I have a higher suspicion of viral etiology right now. I prescribed clindamycin 300 mg 3 times daily x10 days--> patient to wait and fill this prescription only if he is not improving over the next 2 days. Continue daily Flonase and Zyrtec. Referral to ENT ordered today.  An After Visit Summary was printed and given to the patient.  FOLLOW UP: Return if symptoms worsen or fail to improve.  Signed:  Crissie Sickles, MD           02/22/2022

## 2022-03-15 ENCOUNTER — Encounter: Payer: Self-pay | Admitting: Neurology

## 2022-03-15 ENCOUNTER — Other Ambulatory Visit (INDEPENDENT_AMBULATORY_CARE_PROVIDER_SITE_OTHER): Payer: No Typology Code available for payment source

## 2022-03-15 ENCOUNTER — Ambulatory Visit: Payer: No Typology Code available for payment source

## 2022-03-15 DIAGNOSIS — I67850 Cerebral autosomal dominant arteriopathy with subcortical infarcts and leukoencephalopathy: Secondary | ICD-10-CM

## 2022-03-15 DIAGNOSIS — E782 Mixed hyperlipidemia: Secondary | ICD-10-CM | POA: Diagnosis not present

## 2022-03-15 LAB — LIPID PANEL
Cholesterol: 137 mg/dL (ref 0–200)
HDL: 45.8 mg/dL (ref >39.00)
LDL Cholesterol: 57 mg/dL (ref 0–99)
NonHDL: 91.2
Total CHOL/HDL Ratio: 3
Triglycerides: 169 mg/dL — ABNORMAL HIGH (ref 0.0–149.0)
VLDL: 33.8 mg/dL (ref 0.0–40.0)

## 2022-03-15 LAB — AST: AST: 22 U/L (ref 0–37)

## 2022-03-15 LAB — ALT: ALT: 38 U/L (ref 0–53)

## 2022-03-16 ENCOUNTER — Other Ambulatory Visit: Payer: Self-pay

## 2022-03-16 MED ORDER — ROSUVASTATIN CALCIUM 20 MG PO TABS
20.0000 mg | ORAL_TABLET | Freq: Every day | ORAL | 1 refills | Status: DC
  Filled 2022-03-16: qty 90, 90d supply, fill #0
  Filled 2022-06-12: qty 90, 90d supply, fill #1

## 2022-04-19 ENCOUNTER — Other Ambulatory Visit: Payer: Self-pay | Admitting: Internal Medicine

## 2022-04-20 ENCOUNTER — Other Ambulatory Visit: Payer: Self-pay

## 2022-04-20 MED ORDER — OMEPRAZOLE 40 MG PO CPDR
DELAYED_RELEASE_CAPSULE | Freq: Every day | ORAL | 3 refills | Status: DC
  Filled 2022-04-20: qty 90, 90d supply, fill #0
  Filled 2022-07-18: qty 90, 90d supply, fill #1
  Filled 2022-11-09: qty 90, 90d supply, fill #2

## 2022-04-20 MED ORDER — MESALAMINE 1.2 G PO TBEC
DELAYED_RELEASE_TABLET | ORAL | 3 refills | Status: DC
  Filled 2022-04-20: qty 360, 90d supply, fill #0
  Filled 2022-07-18: qty 360, 90d supply, fill #1
  Filled 2022-11-09: qty 360, 90d supply, fill #2

## 2022-04-23 ENCOUNTER — Other Ambulatory Visit: Payer: Self-pay

## 2022-04-27 ENCOUNTER — Telehealth: Payer: Self-pay

## 2022-04-27 NOTE — Telephone Encounter (Signed)
I called and left Michael Moon a detailed message that his FMLA paperwork has been done and faxed to [facility], fax # [phone], confirmation received that it went thru. Scanned forms into epic. I reminded him in my message to please come get his lab work done.

## 2022-04-30 ENCOUNTER — Other Ambulatory Visit: Payer: Self-pay | Admitting: Internal Medicine

## 2022-04-30 ENCOUNTER — Other Ambulatory Visit (INDEPENDENT_AMBULATORY_CARE_PROVIDER_SITE_OTHER): Payer: No Typology Code available for payment source

## 2022-04-30 DIAGNOSIS — E876 Hypokalemia: Secondary | ICD-10-CM

## 2022-04-30 DIAGNOSIS — K5010 Crohn's disease of large intestine without complications: Secondary | ICD-10-CM

## 2022-04-30 DIAGNOSIS — Z7960 Long term (current) use of unspecified immunomodulators and immunosuppressants: Secondary | ICD-10-CM

## 2022-04-30 LAB — CBC WITH DIFFERENTIAL/PLATELET
Basophils Absolute: 0.1 10*3/uL (ref 0.0–0.1)
Basophils Relative: 0.7 % (ref 0.0–3.0)
Eosinophils Absolute: 0.1 10*3/uL (ref 0.0–0.7)
Eosinophils Relative: 1.1 % (ref 0.0–5.0)
HCT: 42.5 % (ref 39.0–52.0)
Hemoglobin: 14.1 g/dL (ref 13.0–17.0)
Lymphocytes Relative: 27.1 % (ref 12.0–46.0)
Lymphs Abs: 2 10*3/uL (ref 0.7–4.0)
MCHC: 33.3 g/dL (ref 30.0–36.0)
MCV: 86.9 fl (ref 78.0–100.0)
Monocytes Absolute: 0.5 10*3/uL (ref 0.1–1.0)
Monocytes Relative: 6.5 % (ref 3.0–12.0)
Neutro Abs: 4.7 10*3/uL (ref 1.4–7.7)
Neutrophils Relative %: 64.6 % (ref 43.0–77.0)
Platelets: 398 10*3/uL (ref 150.0–400.0)
RBC: 4.89 Mil/uL (ref 4.22–5.81)
RDW: 15.3 % (ref 11.5–15.5)
WBC: 7.3 10*3/uL (ref 4.0–10.5)

## 2022-04-30 LAB — COMPREHENSIVE METABOLIC PANEL
ALT: 29 U/L (ref 0–53)
AST: 17 U/L (ref 0–37)
Albumin: 4.5 g/dL (ref 3.5–5.2)
Alkaline Phosphatase: 66 U/L (ref 39–117)
BUN: 16 mg/dL (ref 6–23)
CO2: 25 mEq/L (ref 19–32)
Calcium: 9.8 mg/dL (ref 8.4–10.5)
Chloride: 103 mEq/L (ref 96–112)
Creatinine, Ser: 0.85 mg/dL (ref 0.40–1.50)
GFR: 118.31 mL/min (ref >60.00)
Glucose, Bld: 88 mg/dL (ref 70–99)
Potassium: 4 mEq/L (ref 3.5–5.1)
Sodium: 140 mEq/L (ref 135–145)
Total Bilirubin: 0.3 mg/dL (ref 0.2–1.2)
Total Protein: 7.9 g/dL (ref 6.0–8.3)

## 2022-05-01 ENCOUNTER — Other Ambulatory Visit: Payer: Self-pay | Admitting: Family Medicine

## 2022-05-01 ENCOUNTER — Other Ambulatory Visit: Payer: Self-pay

## 2022-05-04 ENCOUNTER — Other Ambulatory Visit: Payer: Self-pay | Admitting: Otolaryngology

## 2022-05-04 ENCOUNTER — Other Ambulatory Visit (HOSPITAL_COMMUNITY): Payer: Self-pay | Admitting: Otolaryngology

## 2022-05-04 DIAGNOSIS — J329 Chronic sinusitis, unspecified: Secondary | ICD-10-CM

## 2022-05-05 ENCOUNTER — Other Ambulatory Visit: Payer: Self-pay | Admitting: Internal Medicine

## 2022-05-06 ENCOUNTER — Other Ambulatory Visit: Payer: Self-pay

## 2022-05-06 MED ORDER — MERCAPTOPURINE 50 MG PO TABS
100.0000 mg | ORAL_TABLET | Freq: Every day | ORAL | 0 refills | Status: DC
Start: 2022-05-06 — End: 2022-08-14
  Filled 2022-05-06: qty 180, 90d supply, fill #0

## 2022-05-07 ENCOUNTER — Other Ambulatory Visit: Payer: Self-pay | Admitting: Otolaryngology

## 2022-05-07 ENCOUNTER — Other Ambulatory Visit: Payer: Self-pay | Admitting: Family Medicine

## 2022-05-07 ENCOUNTER — Other Ambulatory Visit: Payer: Self-pay

## 2022-05-07 DIAGNOSIS — J329 Chronic sinusitis, unspecified: Secondary | ICD-10-CM

## 2022-05-08 ENCOUNTER — Other Ambulatory Visit: Payer: Self-pay

## 2022-05-08 MED FILL — Fluticasone Propionate Nasal Susp 50 MCG/ACT: NASAL | 30 days supply | Qty: 16 | Fill #0 | Status: AC

## 2022-05-09 ENCOUNTER — Ambulatory Visit
Admission: RE | Admit: 2022-05-09 | Discharge: 2022-05-09 | Disposition: A | Discharge status: Home / Self Care | Payer: No Typology Code available for payment source | Source: Ambulatory Visit | Attending: Otolaryngology | Admitting: Otolaryngology

## 2022-05-09 DIAGNOSIS — J329 Chronic sinusitis, unspecified: Secondary | ICD-10-CM | POA: Diagnosis present

## 2022-05-11 ENCOUNTER — Ambulatory Visit: Payer: No Typology Code available for payment source

## 2022-06-13 ENCOUNTER — Other Ambulatory Visit: Payer: Self-pay

## 2022-07-16 ENCOUNTER — Telehealth: Payer: Self-pay | Admitting: Internal Medicine

## 2022-07-16 NOTE — Telephone Encounter (Signed)
Please have him see me at 150 Wed 12/6

## 2022-07-16 NOTE — Telephone Encounter (Signed)
Pt stated that he had one episode of diarrhea last Tuesday( took imodium and felt a lot better)   Diarrhea returned on Friday and has remained:  Pt stated that only on Friday that he had a small fever associated with Chills,  passing lots of gas, No nausea, Averaging 3 loose stools a day:  Please advise

## 2022-07-16 NOTE — Telephone Encounter (Signed)
Pt made aware of Dr. Carlean Purl recommendations: Pt was scheduled for an office visit on 07/18/2022 at 1:50: Pt made aware: Pt verbalized understanding with all questions answered.

## 2022-07-16 NOTE — Telephone Encounter (Signed)
Inbound call from patient requesting a call back, states he has had diarrhea since Friday.

## 2022-07-18 ENCOUNTER — Other Ambulatory Visit (INDEPENDENT_AMBULATORY_CARE_PROVIDER_SITE_OTHER): Payer: No Typology Code available for payment source

## 2022-07-18 ENCOUNTER — Ambulatory Visit: Payer: No Typology Code available for payment source | Admitting: Internal Medicine

## 2022-07-18 ENCOUNTER — Encounter: Payer: Self-pay | Admitting: Internal Medicine

## 2022-07-18 VITALS — BP 122/78 | HR 99 | Ht 72.0 in | Wt 276.4 lb

## 2022-07-18 DIAGNOSIS — K50118 Crohn's disease of large intestine with other complication: Secondary | ICD-10-CM

## 2022-07-18 DIAGNOSIS — K5010 Crohn's disease of large intestine without complications: Secondary | ICD-10-CM | POA: Diagnosis not present

## 2022-07-18 DIAGNOSIS — R197 Diarrhea, unspecified: Secondary | ICD-10-CM

## 2022-07-18 DIAGNOSIS — Z7960 Long term (current) use of unspecified immunomodulators and immunosuppressants: Secondary | ICD-10-CM

## 2022-07-18 LAB — CBC WITH DIFFERENTIAL/PLATELET
Basophils Absolute: 0.1 10*3/uL (ref 0.0–0.1)
Basophils Relative: 1.3 % (ref 0.0–3.0)
Eosinophils Absolute: 0 10*3/uL (ref 0.0–0.7)
Eosinophils Relative: 0.6 % (ref 0.0–5.0)
HCT: 43 % (ref 39.0–52.0)
Hemoglobin: 14.6 g/dL (ref 13.0–17.0)
Lymphocytes Relative: 18.4 % (ref 12.0–46.0)
Lymphs Abs: 1.4 10*3/uL (ref 0.7–4.0)
MCHC: 33.9 g/dL (ref 30.0–36.0)
MCV: 85.6 fl (ref 78.0–100.0)
Monocytes Absolute: 0.5 10*3/uL (ref 0.1–1.0)
Monocytes Relative: 7.3 % (ref 3.0–12.0)
Neutro Abs: 5.4 10*3/uL (ref 1.4–7.7)
Neutrophils Relative %: 72.4 % (ref 43.0–77.0)
Platelets: 462 10*3/uL — ABNORMAL HIGH (ref 150.0–400.0)
RBC: 5.02 Mil/uL (ref 4.22–5.81)
RDW: 14.8 % (ref 11.5–15.5)
WBC: 7.4 10*3/uL (ref 4.0–10.5)

## 2022-07-18 LAB — SEDIMENTATION RATE: Sed Rate: 38 mm/hr — ABNORMAL HIGH (ref 0–15)

## 2022-07-18 LAB — COMPREHENSIVE METABOLIC PANEL
ALT: 66 U/L — ABNORMAL HIGH (ref 0–53)
AST: 36 U/L (ref 0–37)
Albumin: 5.1 g/dL (ref 3.5–5.2)
Alkaline Phosphatase: 69 U/L (ref 39–117)
BUN: 10 mg/dL (ref 6–23)
CO2: 28 mEq/L (ref 19–32)
Calcium: 9.8 mg/dL (ref 8.4–10.5)
Chloride: 101 mEq/L (ref 96–112)
Creatinine, Ser: 0.96 mg/dL (ref 0.40–1.50)
GFR: 107.45 mL/min (ref >60.00)
Glucose, Bld: 92 mg/dL (ref 70–99)
Potassium: 3.7 mEq/L (ref 3.5–5.1)
Sodium: 139 mEq/L (ref 135–145)
Total Bilirubin: 0.4 mg/dL (ref 0.2–1.2)
Total Protein: 8.3 g/dL (ref 6.0–8.3)

## 2022-07-18 LAB — C-REACTIVE PROTEIN: CRP: <1 mg/dL (ref 0.5–20.0)

## 2022-07-18 NOTE — Progress Notes (Signed)
Michael Moon 26 y.o. [birth year] [id number]  Assessment & Plan:   Encounter Diagnoses  Name Primary?   Diarrhea, unspecified type Yes   Crohn's disease of colon with other complication ([place])    Long-term use of immunosuppressant medication - 6 mercaptopurine    Long-term use of immunosuppressant medication    Crohn's disease of colon without complication ([place])     At this point it sounds like an infectious etiology that seems to be improving.  Certainly could be a flare of his disease.  We are holding infection testing with the [employer] swab test for GI pathogens including C. difficile in reserve.  As long as she continues to improve we will monitor and determine follow-up.   Continue current medications   Orders Placed This Encounter  Procedures   CBC with Differential/Platelet   Comprehensive metabolic panel   Sedimentation rate   C-reactive protein   Other/Misc lab test   Lab Results  Component Value Date   WBC 7.4 07/18/2022   HGB 14.6 07/18/2022   HCT 43.0 07/18/2022   MCV 85.6 07/18/2022   PLT 462.0 (H) 07/18/2022   Lab Results  Component Value Date   CREATININE 0.96 07/18/2022   BUN 10 07/18/2022   NA 139 07/18/2022   K 3.7 07/18/2022   CL 101 07/18/2022   CO2 28 07/18/2022   Lab Results  Component Value Date   ALT 66 (H) 07/18/2022   AST 36 07/18/2022   ALKPHOS 69 07/18/2022   BILITOT 0.4 07/18/2022   Lab Results  Component Value Date   ESRSEDRATE 38 (H) 07/18/2022   Lab Results  Component Value Date   CRP <1.0 07/18/2022   I have been in contact with the patient since this visit he is still a little better so he is improving, we will reevaluate the elevated LFT depending upon clinical course but we will certainly do that.  Subjective:   Chief Complaint: Diarrhea in the setting of Crohn's colitis  HPI 26 year old white man with a history of Crohn's colitis treated with 6-MP and Lialda, CADASIL, and GERD who presents for follow-up  evaluation because of recent diarrhea.  Last week he developed bad diarrhea, watery liquid not much cramping had a stuttering course but then after a day or 2 of feeling better he became symptomatic again.  Slightly nauseous.  Persisted over the most recent CT scan and has started to slow down prior to this visit.  Temperature maximum 99 5.  He has been on a brat diet and is mostly experiencing borborygmi and mild cramping over time.  The symptoms are all improving and stools are starting to form up.  Last antibiotics were in July.  He had a similar scenario where at least had a diarrheal illness in the spring of this year and we found astrovirus on a GI pathogen panel.  Wt Readings from Last 3 Encounters:  07/18/22 276 lb 6 oz (125.4 kg)  02/22/22 276 lb 9.6 oz (125.5 kg)  01/30/22 270 lb (122.5 kg)  Colonoscopy 10/26/2020 diminutive polyp removed numerous pseudopolyps, normal ileum, pathology without active colitis though there was some edema on some specimens, the rectal polyp was hyperplastic.  I had planned a routine follow-up for 2 years.  He also has a history of an adenoma.  An EGD at that time showed GERD and esophagitis.  Fundic gland polyps also.  Allergies  Allergen Reactions   Codeine Rash   Current Meds  Medication Sig   acetaminophen (TYLENOL)  500 MG tablet Take 1,000 mg by mouth every 6 (six) hours as needed for moderate pain.   aspirin EC 81 MG tablet Take 81 mg by mouth daily. Swallow whole.   cetirizine (ZYRTEC) 10 MG tablet Take 10 mg by mouth daily as needed for allergies.   famotidine (PEPCID) 10 MG tablet Take 10 mg by mouth daily as needed for heartburn or indigestion.   fluticasone (FLONASE) 50 MCG/ACT nasal spray Place 2 sprays into both nostrils daily.   mercaptopurine (PURINETHOL) 50 MG tablet Take 2 tablets (100 mg total) by mouth daily. Give on an empty stomach 1 hour before or 2 hours after meals. Caution: Chemotherapy.   mesalamine (LIALDA) 1.2 g EC tablet TAKE 4  TABLETS BY MOUTH DAILY WITH BREAKFAST.   omeprazole (PRILOSEC) 40 MG capsule TAKE 1 CAPSULE BY MOUTH DAILY.   rosuvastatin (CRESTOR) 20 MG tablet Take 1 tablet (20 mg total) by mouth daily.   Past Medical History:  Diagnosis Date   Abnormal brain MRI 03/2021   Multiple white matter signal abnormalities around the lateral ventricles->? MS, ? CADASIL-advanced blood testing and LP planned by neurology as of 03/2021, plus referral to [place] [facility].   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 Carlean Purl). 03/2019 colonoscopy->mild active colitis. Recall 1 yr.   Fundic gland polyps of stomach, benign 10/2020   GERD (gastroesophageal reflux disease)    Hx of adenomatous colonic polyps 03/18/2019   Hyperlipemia, mixed 2019,2020   TLC   Obesity, Class I, BMI 30-34.9    Paresthesias    2022->L side of face, L hand and foot.  Lyme neg. See abnormal MRI in [place] section.   Seasonal allergic rhinitis    Past Surgical History:  Procedure Laterality Date   COLONOSCOPY  2012;03/16/2019; 10/2020   2012 crohns. 03/2019->2 adenomas, mild active colitis, otherwise in remission. 10/2020-normal.   ESOPHAGOGASTRODUODENOSCOPY  2012   2012.  10/26/20->mild acute and chronic esophagitis (eosin neg)   TONSILLECTOMY AND ADENOIDECTOMY  08/13/2005   Social History   Social History Narrative   Single, no children.   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   No T/A/Ds.   Lives with mom in [place]   Right handed   Caffeine use: coffee 3-4 times per week, sweet tea sometimes, little soda   family history includes Alcohol abuse in his maternal grandfather, paternal grandfather, and paternal grandmother; Arthritis in his maternal grandmother and mother; Barrett's esophagus in his father;  Brain cancer in his father; Diabetes in his maternal grandmother; Early death in his paternal grandfather; Hearing loss in his maternal grandfather; Hyperlipidemia in his father; Hypertension in his father, maternal grandmother, mother, and paternal grandmother; Stroke in his maternal grandfather and maternal grandmother.   Review of Systems As per HPI  Objective:   Physical Exam '@BP'$  122/78   Pulse 99   Ht 6' (1.829 m)   Wt 276 lb 6 oz (125.4 kg)   SpO2 98%   BMI 37.48 kg/m @  General:  NAD Eyes:   anicteric Lungs:  clear Heart::  S1S2 no rubs, murmurs or gallops Abdomen:  soft and nontender, BS+ Ext:   no edema, cyanosis or clubbing    Data Reviewed:  As per HPI

## 2022-07-18 NOTE — Patient Instructions (Signed)
Your provider has requested that you go to the basement level for lab work before leaving today. Press "B" on the elevator. The lab is located at the first door on the left as you exit the elevator.  Your provider has ordered "[employer]" stool testing for you. You have received a kit from our office today containing all necessary supplies to complete this test. Please carefully read the stool collection instructions provided in the kit before opening the accompanying materials. In addition, be sure to place the label from the top left corner of the laboratory request sheet onto the "puritan opti-swab" tube that is supplied in the kit. This label should include your full name and date of birth. After completing the test, you should secure the purtian tube into the specimen biohazard bag. The laboratory request information sheet (including date and time of specimen collection) should be placed into the outside pocket of the specimen biohazard bag and returned to the [facility] lab with 2 days of collection.    I appreciate the opportunity to care for you. Michael Rusk, MD

## 2022-07-19 ENCOUNTER — Other Ambulatory Visit: Payer: Self-pay

## 2022-07-19 ENCOUNTER — Encounter: Payer: Self-pay | Admitting: Internal Medicine

## 2022-07-20 ENCOUNTER — Other Ambulatory Visit: Payer: Self-pay

## 2022-08-02 ENCOUNTER — Ambulatory Visit: Payer: No Typology Code available for payment source | Admitting: Podiatry

## 2022-08-02 DIAGNOSIS — B070 Plantar wart: Secondary | ICD-10-CM | POA: Diagnosis not present

## 2022-08-02 NOTE — Patient Instructions (Signed)
Take dressing off in 8 hours and wash the foot with soap and water. If it is hurting or becomes uncomfortable before the 8 hours, go ahead and remove the bandage and wash the area.  If it blisters, apply antibiotic ointment and a band-aid.  Monitor for any signs/symptoms of infection. Call the office immediately if any occur or go directly to the emergency room. Call with any questions/concerns.   

## 2022-08-02 NOTE — Progress Notes (Signed)
Subjective:   Patient ID: Michael Moon, male   DOB: 26 y.o.   MRN: [id number]   HPI Chief Complaint  Patient presents with   Plantar Warts    Rm 14 Right plantar wart or callus of latera side of foot. Pt states lesion has become painful.    26 year old male presents the office with above concerns.  It has been about the same size but maybe a little bigger over the last few years. No treatment. No drinage or bleeding.    Review of Systems  All other systems reviewed and are negative.   Past Medical History:  Diagnosis Date   Abnormal brain MRI 03/2021   Multiple white matter signal abnormalities around the lateral ventricles->? MS, ? CADASIL-advanced blood testing and LP planned by neurology as of 03/2021, plus referral to [facility].   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 Carlean Purl). 03/2019 colonoscopy->mild active colitis. Recall 1 yr.   Fundic gland polyps of stomach, benign 10/2020   GERD (gastroesophageal reflux disease)    Hx of adenomatous colonic polyps 03/18/2019   Hyperlipemia, mixed 2019,2020   TLC   Obesity, Class I, BMI 30-34.9    Paresthesias    2022->L side of face, L hand and foot.  Lyme neg. See abnormal MRI in [place] section.   Seasonal allergic rhinitis     Past Surgical History:  Procedure Laterality Date   COLONOSCOPY  2012;03/16/2019; 10/2020   2012 crohns. 03/2019->2 adenomas, mild active colitis, otherwise in remission. 10/2020-normal.   ESOPHAGOGASTRODUODENOSCOPY  2012   2012.  10/26/20->mild acute and chronic esophagitis (eosin neg)   TONSILLECTOMY AND ADENOIDECTOMY  08/13/2005     Current Outpatient Medications:    acetaminophen (TYLENOL) 500 MG tablet, Take 1,000 mg by mouth every 6 (six) hours as needed for moderate pain., Disp: , Rfl:     aspirin EC 81 MG tablet, Take 81 mg by mouth daily. Swallow whole., Disp: , Rfl:    cetirizine (ZYRTEC) 10 MG tablet, Take 10 mg by mouth daily as needed for allergies., Disp: , Rfl:    famotidine (PEPCID) 10 MG tablet, Take 10 mg by mouth daily as needed for heartburn or indigestion., Disp: , Rfl:    fluticasone (FLONASE) 50 MCG/ACT nasal spray, Place 2 sprays into both nostrils daily., Disp: 16 g, Rfl: 0   mercaptopurine (PURINETHOL) 50 MG tablet, Take 2 tablets (100 mg total) by mouth daily. Give on an empty stomach 1 hour before or 2 hours after meals. Caution: Chemotherapy., Disp: 180 tablet, Rfl: 0   mesalamine (LIALDA) 1.2 g EC tablet, TAKE 4 TABLETS BY MOUTH DAILY WITH BREAKFAST., Disp: 360 tablet, Rfl: 3   omeprazole (PRILOSEC) 40 MG capsule, TAKE 1 CAPSULE BY MOUTH DAILY., Disp: 90 capsule, Rfl: 3   rosuvastatin (CRESTOR) 20 MG tablet, Take 1 tablet (20 mg total) by mouth daily., Disp: 90 tablet, Rfl: 1  Allergies  Allergen Reactions   Codeine Rash         Objective:  Physical Exam  General: AAO x3, NAD  Dermatological: On the plantar lateral aspect of the right foot there is a annular hyperkeratotic lesion.  There is no evidence of foreign body, puncture wound today.  There is no fluctuance crepitation and there is no malodor.  No open lesions.  Vascular: Dorsalis Pedis artery and Posterior Tibial artery pedal pulses are 2/4 bilateral with immedate capillary fill time. There is no pain with calf compression, swelling, warmth, erythema.   Neruologic: Grossly intact via light touch bilateral.   Musculoskeletal: Tenderness to palpation along the skin lesion right foot  Gait: Unassisted, Nonantalgic.       Assessment:   Skin lesion right foot, possible verruca     Plan:  -Treatment options discussed including all alternatives, risks, and complications -Etiology of symptoms were discussed -Sharply debrided lesion to the any complications or bleeding.  Skin was cleaned  with alcohol and Cantharone was applied followed by an occlusive bandage.  Postprocedure instructions discussed.  Monitor for any signs or symptoms of infection.  Trula Slade DPM

## 2022-08-14 ENCOUNTER — Other Ambulatory Visit: Payer: Self-pay | Admitting: Internal Medicine

## 2022-08-14 ENCOUNTER — Other Ambulatory Visit: Payer: Self-pay

## 2022-08-14 ENCOUNTER — Other Ambulatory Visit: Payer: Self-pay | Admitting: Family Medicine

## 2022-08-14 MED ORDER — MERCAPTOPURINE 50 MG PO TABS
100.0000 mg | ORAL_TABLET | Freq: Every day | ORAL | 0 refills | Status: DC
  Filled 2022-08-14: qty 180, 90d supply, fill #0

## 2022-08-17 ENCOUNTER — Other Ambulatory Visit: Payer: Self-pay

## 2022-08-20 ENCOUNTER — Other Ambulatory Visit (HOSPITAL_BASED_OUTPATIENT_CLINIC_OR_DEPARTMENT_OTHER): Payer: Self-pay

## 2022-08-20 ENCOUNTER — Ambulatory Visit
Admission: RE | Admit: 2022-08-20 | Discharge: 2022-08-20 | Disposition: A | Discharge status: Home / Self Care | Payer: 59 | Source: Ambulatory Visit | Attending: Family Medicine | Admitting: Family Medicine

## 2022-08-20 VITALS — BP 143/82 | HR 104 | Temp 99.5°F | Resp 17 | Wt 270.0 lb

## 2022-08-20 DIAGNOSIS — H6692 Otitis media, unspecified, left ear: Secondary | ICD-10-CM | POA: Diagnosis not present

## 2022-08-20 DIAGNOSIS — U071 COVID-19: Secondary | ICD-10-CM | POA: Diagnosis not present

## 2022-08-20 MED ORDER — NIRMATRELVIR/RITONAVIR (PAXLOVID)TABLET: 3.0000 | ORAL_TABLET | Freq: Two times a day (BID) | ORAL | 0 refills | Status: AC

## 2022-08-20 MED ORDER — PAXLOVID (300/100) 20 X 150 MG & 10 X 100MG PO TBPK
ORAL_TABLET | ORAL | 0 refills | Status: DC
  Filled 2022-08-20: qty 30, 5d supply, fill #0

## 2022-08-20 MED ORDER — AMOXICILLIN-POT CLAVULANATE 875-125 MG PO TABS
1.0000 | ORAL_TABLET | Freq: Two times a day (BID) | ORAL | 0 refills | Status: AC
  Filled 2022-08-20: qty 14, 7d supply, fill #0

## 2022-08-20 NOTE — Discharge Instructions (Addendum)
Instructed patient to take medications as directed with food to completion. Instructed patient to self quarantine for the next 5 days encouraged patient to increase daily water intake to 64 ounces per day while taking these medications.  Advised if symptoms worsen and/or unresolved please follow-up with PCP or here for further evaluation.

## 2022-08-20 NOTE — ED Triage Notes (Signed)
Pt c/o fever, headache, bodyaches and fatigue since yesterday am. COVID at home test pos yesterday midday. Taking tylenol prn.

## 2022-08-20 NOTE — ED Provider Notes (Signed)
[facility]    CSN: [id number] Arrival date & time: 08/20/22  1031      History   Chief Complaint Chief Complaint  Patient presents with   Generalized Body Aches    COVID POS at home   Fever   Headache    HPI [name] is a 27 y.o. male.    HPI 27 year old male presents with COVID-19 from positive home test yesterday midday.  Additionally patient reports of left ear pain.  PMH significant for white matter abnormality on MRI of brain, obesity, and Crohn's disease of colon.  Past Medical History:  Diagnosis Date   Abnormal brain MRI 03/2021   Multiple white matter signal abnormalities around the lateral ventricles->? MS, ? CADASIL-advanced blood testing and LP planned by neurology as of 03/2021, plus referral to [facility].   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 Carlean Purl). 03/2019 colonoscopy->mild active colitis. Recall 1 yr.   Fundic gland polyps of stomach, benign 10/2020   GERD (gastroesophageal reflux disease)    Hx of adenomatous colonic polyps 03/18/2019   Hyperlipemia, mixed 2019,2020   TLC   Obesity, Class I, BMI 30-34.9    Paresthesias    2022->L side of face, L hand and foot.  Lyme neg. See abnormal MRI in [place] section.   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) 10/02/2021   Family history of genetic disease 07/24/2021   White matter abnormality on MRI of brain 07/24/2021   Left facial numbness 03/31/2021   Tingling 03/31/2021   Hx of adenomatous colonic polyps 03/18/2019   Class 2 obesity due to excess calories with body mass index (BMI) of 38.0 to 38.9 in adult 02/25/2019   Long-term use of  immunosuppressant medication - 6 mercaptopurine 01/23/2019   Crohn's disease of colon ([place]) 11/01/2017   Iron deficiency anemia 01/03/2016   GERD (gastroesophageal reflux disease) 03/13/2012   Allergic rhinitis 08/15/2007    Past Surgical History:  Procedure Laterality Date   COLONOSCOPY  2012;03/16/2019; 10/2020   2012 crohns. 03/2019->2 adenomas, mild active colitis, otherwise in remission. 10/2020-normal.   ESOPHAGOGASTRODUODENOSCOPY  2012   2012.  10/26/20->mild acute and chronic esophagitis (eosin neg)   TONSILLECTOMY AND ADENOIDECTOMY  08/13/2005       Home Medications    Prior to Admission medications   Medication Sig Start Date End Date Taking? Authorizing Provider  amoxicillin-clavulanate (AUGMENTIN) 875-125 MG tablet Take 1 tablet by mouth 2 (two) times daily for 7 days. 08/20/22 08/27/22 Yes Eliezer Lofts, FNP  nirmatrelvir/ritonavir (PAXLOVID) 20 x 150 MG & 10 x '100MG'$  TABS Take 3 tablets by mouth 2 (two) times daily for 5 days. Patient GFR is 107. Take nirmatrelvir (150 mg) two tablets twice daily for 5 days and ritonavir (100 mg) one tablet twice daily for 5 days. 08/20/22 08/25/22 Yes Eliezer Lofts, FNP  acetaminophen (TYLENOL) 500 MG tablet Take 1,000 mg by mouth every 6 (six) hours as needed for moderate pain.    [provider]  aspirin EC 81 MG tablet Take 81 mg by mouth daily. Swallow whole.    [provider]  cetirizine (ZYRTEC) 10 MG tablet Take  10 mg by mouth daily as needed for allergies.    [provider]  famotidine (PEPCID) 10 MG tablet Take 10 mg by mouth daily as needed for heartburn or indigestion.    [provider]  fluticasone (FLONASE) 50 MCG/ACT nasal spray Place 2 sprays into both nostrils daily. 05/08/22   McGowen, Adrian Blackwater, MD  mercaptopurine (PURINETHOL) 50 MG tablet Take 2 tablets (100 mg total) by mouth daily. Give on an empty stomach 1 hour before or 2 hours after meals. Caution: Chemotherapy. 08/14/22   Gatha Mayer,  MD  mesalamine (LIALDA) 1.2 g EC tablet TAKE 4 TABLETS BY MOUTH DAILY WITH BREAKFAST. 04/20/22 04/20/23  Gatha Mayer, MD  nirmatrelvir & ritonavir (PAXLOVID, 300/100,) 20 x 150 MG & 10 x '100MG'$  TBPK Take as directed on manufacturer box. 08/20/22   Eliezer Lofts, FNP  omeprazole (PRILOSEC) 40 MG capsule TAKE 1 CAPSULE BY MOUTH DAILY. 04/20/22 04/20/23  Gatha Mayer, MD  rosuvastatin (CRESTOR) 20 MG tablet Take 1 tablet (20 mg total) by mouth daily. 03/16/22   McGowen, Adrian Blackwater, MD    Family History Family History  Problem Relation Age of Onset   Arthritis Mother    Hypertension Mother    Brain cancer Father    Hyperlipidemia Father    Hypertension Father    Barrett's esophagus Father        last EGD showed high-grade dysplasia   Arthritis Maternal Grandmother    Diabetes Maternal Grandmother    Hypertension Maternal Grandmother    Stroke Maternal Grandmother    Stroke Maternal Grandfather    Hearing loss Maternal Grandfather    Alcohol abuse Maternal Grandfather    Alcohol abuse Paternal Grandmother    Hypertension Paternal Grandmother    Alcohol abuse Paternal Grandfather    Early death Paternal Grandfather        unknown cause   Rectal cancer Neg Hx    Colon cancer Neg Hx    Esophageal cancer Neg Hx    Stomach cancer Neg Hx    Pancreatic cancer Neg Hx     Social History Social History   Tobacco Use   Smoking status: Never   Smokeless tobacco: Never  Vaping Use   Vaping Use: Never used  Substance Use Topics   Alcohol use: No   Drug use: No     Allergies   Codeine   Review of Systems Review of Systems  Constitutional:  Positive for fatigue and fever.  HENT:  Positive for ear pain.   All other systems reviewed and are negative.    Physical Exam Triage Vital Signs ED Triage Vitals [08/20/22 1041]  Enc Vitals Group     BP (!) 143/82     Pulse Rate (!) 104     Resp 17     Temp 99.5 F (37.5 C)     Temp Source Oral     SpO2 97 %     Weight 270 lb (122.5  kg)     Height      Head Circumference      Peak Flow      Pain Score 0     Pain Loc      Pain Edu?      Excl. in [place]?    No data found.  Updated Vital Signs BP (!) 143/82 (BP Location: Right Arm)   Pulse (!) 104   Temp 99.5 F (37.5 C) (Oral)   Resp 17   Wt 270 lb (  122.5 kg)   SpO2 97%   BMI 36.62 kg/m   Visual Acuity Right Eye Distance:   Left Eye Distance:   Bilateral Distance:    Right Eye Near:   Left Eye Near:    Bilateral Near:     Physical Exam Vitals and nursing note reviewed.  Constitutional:      General: He is not in acute distress.    Appearance: He is obese. He is ill-appearing. He is not toxic-appearing.  HENT:     Head: Normocephalic and atraumatic.     Right Ear: Tympanic membrane, ear canal and external ear normal.     Left Ear: Ear canal and external ear normal.     Ears:     Comments: Left TM: Erythematous, bulging    Mouth/Throat:     Mouth: Mucous membranes are moist.     Pharynx: Oropharynx is clear.  Eyes:     Extraocular Movements: Extraocular movements intact.     Conjunctiva/sclera: Conjunctivae normal.     Pupils: Pupils are equal, round, and reactive to light.  Cardiovascular:     Rate and Rhythm: Normal rate and regular rhythm.     Pulses: Normal pulses.     Heart sounds: Normal heart sounds.  Pulmonary:     Effort: Pulmonary effort is normal.     Breath sounds: Normal breath sounds. No wheezing, rhonchi or rales.  Musculoskeletal:        General: Normal range of motion.     Cervical back: Normal range of motion and neck supple.  Skin:    General: Skin is warm and dry.  Neurological:     General: No focal deficit present.     Mental Status: He is alert and oriented to person, place, and time.      UC Treatments / Results  Labs (all labs ordered are listed, but only abnormal results are displayed) Labs Reviewed - No data to display  EKG   Radiology No results found.  Procedures Procedures (including critical  care time)  Medications Ordered in UC Medications - No data to display  Initial Impression / Assessment and Plan / UC Course  I have reviewed the triage vital signs and the nursing notes.  Pertinent labs & imaging results that were available during my care of the patient were reviewed by me and considered in my medical decision making (see chart for details).     MDM: 1.  COVID-19-Rx'd Paxlovid; 2. Left otitis media-Rx'd Augmentin. Instructed patient to take medications as directed with food to completion. Instructed patient to self quarantine for the next 5 days encouraged patient to increase daily water intake to 64 ounces per day while taking these medications.  Advised if symptoms worsen and/or unresolved please follow-up with PCP or here for further evaluation.  Final Clinical Impressions(s) / UC Diagnoses   Final diagnoses:  COVID-19  Acute left otitis media     Discharge Instructions      Instructed patient to take medications as directed with food to completion. Instructed patient to self quarantine for the next 5 days encouraged patient to increase daily water intake to 64 ounces per day while taking these medications.  Advised if symptoms worsen and/or unresolved please follow-up with PCP or here for further evaluation.     ED Prescriptions     Medication Sig Dispense Auth. Provider   amoxicillin-clavulanate (AUGMENTIN) 875-125 MG tablet Take 1 tablet by mouth 2 (two) times daily for 7 days. 14 tablet Eliezer Lofts, FNP  nirmatrelvir/ritonavir (PAXLOVID) 20 x 150 MG & 10 x '100MG'$  TABS Take 3 tablets by mouth 2 (two) times daily for 5 days. Patient GFR is 107. Take nirmatrelvir (150 mg) two tablets twice daily for 5 days and ritonavir (100 mg) one tablet twice daily for 5 days. 30 tablet Eliezer Lofts, FNP      PDMP not reviewed this encounter.   Eliezer Lofts, [place] 08/20/22 1138

## 2022-09-10 ENCOUNTER — Other Ambulatory Visit: Payer: Self-pay | Admitting: Family Medicine

## 2022-09-10 ENCOUNTER — Other Ambulatory Visit: Payer: Self-pay

## 2022-09-10 MED ORDER — ROSUVASTATIN CALCIUM 20 MG PO TABS
20.0000 mg | ORAL_TABLET | Freq: Every day | ORAL | 0 refills | Status: DC
  Filled 2022-09-10: qty 90, 90d supply, fill #0

## 2022-09-12 ENCOUNTER — Other Ambulatory Visit: Payer: Self-pay

## 2022-09-12 MED ORDER — PREDNISONE 10 MG PO TABS
30.0000 mg | ORAL_TABLET | Freq: Every day | ORAL | 0 refills | Status: DC
  Filled 2022-09-12: qty 9, 3d supply, fill #0

## 2022-09-12 MED ORDER — AZITHROMYCIN 250 MG PO TABS
ORAL_TABLET | ORAL | 0 refills | Status: DC
  Filled 2022-09-12: qty 6, 5d supply, fill #0

## 2022-10-26 ENCOUNTER — Telehealth: Payer: Self-pay

## 2022-10-26 NOTE — Telephone Encounter (Signed)
FMLA paperwork  put on Dr Celesta Aver desk to be completed.

## 2022-10-26 NOTE — Telephone Encounter (Signed)
FMLA paperwork completed and faxed to # [phone], got confirmation it went thru. Will have it scanned into epic on Monday. Orren informed.

## 2022-11-09 ENCOUNTER — Other Ambulatory Visit: Payer: Self-pay

## 2022-11-09 ENCOUNTER — Other Ambulatory Visit: Payer: Self-pay | Admitting: Internal Medicine

## 2022-11-12 ENCOUNTER — Other Ambulatory Visit: Payer: Self-pay

## 2022-11-12 MED FILL — Mercaptopurine Tab 50 MG: ORAL | 90 days supply | Qty: 180 | Fill #0 | Status: AC

## 2022-11-19 ENCOUNTER — Other Ambulatory Visit: Payer: Self-pay

## 2022-11-26 ENCOUNTER — Ambulatory Visit: Payer: 59 | Admitting: Neurology

## 2022-11-26 ENCOUNTER — Encounter: Payer: Self-pay | Admitting: Neurology

## 2022-11-26 ENCOUNTER — Other Ambulatory Visit: Payer: Self-pay

## 2022-11-26 VITALS — BP 134/78 | HR 87 | Ht 72.0 in | Wt 273.5 lb

## 2022-11-26 DIAGNOSIS — K5010 Crohn's disease of large intestine without complications: Secondary | ICD-10-CM

## 2022-11-26 DIAGNOSIS — R9082 White matter disease, unspecified: Secondary | ICD-10-CM | POA: Diagnosis not present

## 2022-11-26 DIAGNOSIS — I67850 Cerebral autosomal dominant arteriopathy with subcortical infarcts and leukoencephalopathy: Secondary | ICD-10-CM | POA: Diagnosis not present

## 2022-11-26 DIAGNOSIS — G43109 Migraine with aura, not intractable, without status migrainosus: Secondary | ICD-10-CM | POA: Diagnosis not present

## 2022-11-26 MED ORDER — SUMATRIPTAN SUCCINATE 100 MG PO TABS
100.0000 mg | ORAL_TABLET | Freq: Once | ORAL | 2 refills | Status: DC | PRN
  Filled 2022-11-26: qty 9, 30d supply, fill #0

## 2022-11-26 NOTE — Progress Notes (Signed)
[facility]  PATIENT: Michael Moon DOB: June 29, 1996  REFERRING DOCTOR OR PCP: Nicoletta Ba MD SOURCE: Patient, notes from primary care, notes from Drs. Lorna Few (Neurology)  _________________________________   HISTORICAL  CHIEF COMPLAINT:  Chief Complaint  Patient presents with   Follow-up    Pt in room 10, here for 1 year follow up. Pt states no issues or concerns. Pt reports having a common cold, fever, head congestion. Blood pressure elevated recheck was better.    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      Update 11/13/2021: Since the last visit he is mostly fine.  He had one severe migraine early August 2023 associated with tunnel vision and right sided HA.  While driving ome from work, he had numbness in the left hand and visual symptoms stayed.  He had nausea.   NSAIDs helped some.  Marland Kitchen He occasional has face or tongue tingling,    Of note, e slept poorly the night before the migrane.   Notes no change in congition or other neurologic symptoms  CADASIL History  In 2022, he had an episode of left facial and hand numbness.   He had an MRI performed and a brain MRI was performed showing multiple non-enhancing lesions in his brain, mostly in the pericallosal white matter and in the deep white matter with small periatrial confluencies.    I showed him the images.  The foci are nonspecific.  There were only a couple of periventricular foci and no foci in the infratentorial white matter.  Therefore, the pattern would not be considered classic for MS.  There were no foci in the anterior temporal lobes or in the external capsules, hence the nonspecific foci would be atypical for CADASIL.    He has a FH of CADASIL (maternal uncle now deceased in an accident).  He had headaches and a stroke prompting the diagnosis   His mother has not been tested and has no definite symptoms (no HA or memory issues) but has had mild cognitive  decline and is presumed to have CADASIL as well.Marland Kitchen        He denies migraines and other headaches are rare.    He denies other neurologic symptoms besides the left sided numbness  Due to concern of MS, he had LP and C-spine MRI There were no OCB and normal IgG Index.   Protein was mildly elevated in the CSF.  Sed rate was mildly elevated at 42.  CRP was normal.  Lyme serology was normal.  Cervical spine shows minimal bulges and is otherwise normal.     He also had a thoracic spine MRI, SSA/SSB and ANCA, all normal.    When his mother delivered him, she had a fever of 103 F.  He has no history of significant trauma.  He has Crohn's Disease and is on mesalamine and mercaptopurine and omeprazole.    Imaging: MRI of the brain 03/24/2021 shows multiple non-enhancing lesions in his brain, mostly in the pericallosal white matter and in the deep white matter with small periatrial confluencies.     The foci are nonspecific.  There were only a couple of periventricular foci and no foci in the infratentorial white matter. There were no foci in the anterior temporal lobes or in the external capsules.  Characteristics are not classic for MS or for CADASIL.  MRI of the cervical spine 03/24/2021 showed a normal spinal cord.  Had a couple of  very small disc bulges that would not be symptomatic  MRI of the thoracic spine 07/18/2021 was normal.  REVIEW OF SYSTEMS: Constitutional: No fevers, chills, sweats, or change in appetite Eyes: No visual changes, double vision, eye pain Ear, nose and throat: No hearing loss, ear pain, nasal congestion, sore throat Cardiovascular: No chest pain, palpitations Respiratory:  No shortness of breath at rest or with exertion.   No wheezes GastrointestinaI: He has Crohn's disease  genitourinary:  No dysuria, urinary retention or frequency.  No nocturia. Musculoskeletal:  No neck pain, back pain Integumentary: No rash, pruritus, skin lesions Neurological: as above Psychiatric: No  depression at this time.  No anxiety Endocrine: No palpitations, diaphoresis, change in appetite, change in weigh or increased thirst Hematologic/Lymphatic:  No anemia, purpura, petechiae. Allergic/Immunologic: No itchy/runny eyes, nasal congestion, recent allergic reactions, rashes  ALLERGIES: Allergies  Allergen Reactions   Codeine Rash    HOME MEDICATIONS:  Current Outpatient Medications:    acetaminophen (TYLENOL) 500 MG tablet, Take 1,000 mg by mouth every 6 (six) hours as needed for moderate pain., Disp: , Rfl:    aspirin EC 81 MG tablet, Take 81 mg by mouth daily. Swallow whole., Disp: , Rfl:    cetirizine (ZYRTEC) 10 MG tablet, Take 10 mg by mouth daily as needed for allergies., Disp: , Rfl:    famotidine (PEPCID) 10 MG tablet, Take 10 mg by mouth daily as needed for heartburn or indigestion., Disp: , Rfl:    fluticasone (FLONASE) 50 MCG/ACT nasal spray, Place 2 sprays into both nostrils daily., Disp: 16 g, Rfl: 0   mercaptopurine (PURINETHOL) 50 MG tablet, Take 2 tablets (100 mg total) by mouth daily. Give on an empty stomach 1 hour before or 2 hours after meals. Caution: Chemotherapy., Disp: 180 tablet, Rfl: 0   mesalamine (LIALDA) 1.2 g EC tablet, TAKE 4 TABLETS BY MOUTH DAILY WITH BREAKFAST., Disp: 360 tablet, Rfl: 3   omeprazole (PRILOSEC) 40 MG capsule, TAKE 1 CAPSULE BY MOUTH DAILY., Disp: 90 capsule, Rfl: 3   rosuvastatin (CRESTOR) 20 MG tablet, Take 1 tablet (20 mg total) by mouth daily., Disp: 90 tablet, Rfl: 0   azithromycin (ZITHROMAX) 250 MG tablet, Take 2 tablets by mouth on day 1, then one tablet daily for 4 days (Patient not taking: Reported on 11/26/2022), Disp: 6 tablet, Rfl: 0   nirmatrelvir & ritonavir (PAXLOVID, 300/100,) 20 x 150 MG & 10 x 100MG  TBPK, Take as directed on manufacturer box. (Patient not taking: Reported on 11/26/2022), Disp: 30 tablet, Rfl: 0   predniSONE (DELTASONE) 10 MG tablet, Take 3 tablets (30 mg total) by mouth daily for 3 days. (Patient not  taking: Reported on 11/26/2022), Disp: 9 tablet, Rfl: 0  PAST MEDICAL HISTORY: Past Medical History:  Diagnosis Date   Abnormal brain MRI 03/2021   Multiple white matter signal abnormalities around the lateral ventricles->? MS, ? CADASIL-advanced blood testing and LP planned by neurology as of 03/2021, plus referral to [facility] MS clinic.   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 Leone Payor). 03/2019 colonoscopy->mild active colitis. Recall 1 yr.   Fundic gland polyps of stomach, benign 10/2020   GERD (gastroesophageal reflux disease)    Hx of adenomatous colonic polyps 03/18/2019   Hyperlipemia, mixed 2019,2020   TLC  Obesity, Class I, BMI 30-34.9    Paresthesias    2022->L side of face, L hand and foot.  Lyme neg. See abnormal MRI in PMH section.   Seasonal allergic rhinitis     PAST SURGICAL HISTORY: Past Surgical History:  Procedure Laterality Date   COLONOSCOPY  2012;03/16/2019; 10/2020   2012 crohns. 03/2019->2 adenomas, mild active colitis, otherwise in remission. 10/2020-normal.   ESOPHAGOGASTRODUODENOSCOPY  2012   2012.  10/26/20->mild acute and chronic esophagitis (eosin neg)   TONSILLECTOMY AND ADENOIDECTOMY  08/13/2005    FAMILY HISTORY: Family History  Problem Relation Age of Onset   Arthritis Mother    Hypertension Mother    Brain cancer Father    Hyperlipidemia Father    Hypertension Father    Barrett's esophagus Father        last EGD showed high-grade dysplasia   Arthritis Maternal Grandmother    Diabetes Maternal Grandmother    Hypertension Maternal Grandmother    Stroke Maternal Grandmother    Stroke Maternal Grandfather    Hearing loss Maternal Grandfather    Alcohol abuse Maternal Grandfather    Alcohol abuse Paternal Grandmother    Hypertension Paternal Grandmother     Alcohol abuse Paternal Grandfather    Early death Paternal Grandfather        unknown cause   Rectal cancer Neg Hx    Colon cancer Neg Hx    Esophageal cancer Neg Hx    Stomach cancer Neg Hx    Pancreatic cancer Neg Hx     SOCIAL HISTORY:  Social History   Socioeconomic History   Marital status: Single    Spouse name: Not on file   Number of children: 0   Years of education: Not on file   Highest education level: Associate degree: occupational, Scientist, product/process development, or vocational program  Occupational History   Occupation: MRI Retail buyer: [employer]  Tobacco Use   Smoking status: Never   Smokeless tobacco: Never  Vaping Use   Vaping Use: Never used  Substance and Sexual Activity   Alcohol use: No   Drug use: No   Sexual activity: Not on file  Other Topics Concern   Not on file  Social History Narrative   Single, no children.   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   No T/A/Ds.   Lives with mom in [place]   Right handed   Caffeine use: coffee 3-4 times per week, sweet tea sometimes, little soda   Social Determinants of Health   Financial Resource Strain: Low Risk  (02/21/2022)   Overall Financial Resource Strain (CARDIA)    Difficulty of Paying Living Expenses: Not hard at all  Food Insecurity: No Food Insecurity (02/21/2022)   Hunger Vital Sign    Worried About Running Out of Food in the Last Year: Never true    Ran Out of Food in the Last Year: Never true  Transportation Needs: No Transportation Needs (02/21/2022)   PRAPARE - Administrator, Civil Service (Medical): No    Lack of Transportation (Non-Medical): No  Physical Activity: Insufficiently Active (02/21/2022)   Exercise Vital Sign    Days of Exercise per Week: 3 days    Minutes of Exercise per Session: 20 min  Stress: Stress Concern Present (02/21/2022)   [employer] of Occupational Health - Occupational Stress Questionnaire    Feeling of  Stress : To some extent  Social Connections: Unknown (02/21/2022)   Social  Connection and Isolation Panel [NHANES]    Frequency of Communication with Friends and Family: Once a week    Frequency of Social Gatherings with Friends and Family: Once a week    Attends Religious Services: 1 to 4 times per year    Active Member of [employer] or Organizations: No    Attends Banker Meetings: Not on file    Marital Status: Not on file  Intimate Partner Violence: Not on file     PHYSICAL EXAM  Vitals:   11/26/22 0948 11/26/22 1008  BP: (!) 158/101 134/78  Pulse: 82 87  Weight: 273 lb 8 oz (124.1 kg)   Height: 6' (1.829 m)     Body mass index is 37.09 kg/m.   General: The patient is well-developed and well-nourished and in no acute distress  HEENT:  Head is [place]/AT.  Sclera are anicteric.     Skin: Extremities are without rash or  edema.  Musculoskeletal/Ext:   He has out-toeing  Neurologic Exam  Mental status: The patient is alert and oriented x 3 at the time of the examination. The patient has apparent normal recent and remote memory, with an apparently normal attention span and concentration ability.   Speech is normal.  Cranial nerves: Extraocular movements are full. Facial strengh is normal.   Motor:  Muscle bulk is normal.   Tone is normal. Strength is  5 / 5 in all 4 extremities.   Sensory:  Sensory testing is intact to  soft touch and vibration sensation in all 4 extremities.  Coordination: Cerebellar testing reveals good finger-nose-finger and heel-to-shin bilaterally.  Gait and station: Station is normal.   Gait has out-toeing. Tandem gait is normal. Romberg is negative.   Reflexes: Deep tendon reflexes are symmetric and normal in arms and mildly increased in legs (spread at knees, no ankle clonus).Marland Kitchen       DIAGNOSTIC DATA (LABS, IMAGING, TESTING) - I reviewed patient records, labs, notes, testing and imaging myself where available.  Lab Results  Component  Value Date   WBC 7.4 07/18/2022   HGB 14.6 07/18/2022   HCT 43.0 07/18/2022   MCV 85.6 07/18/2022   PLT 462.0 (H) 07/18/2022      Component Value Date/Time   NA 139 07/18/2022 1429   K 3.7 07/18/2022 1429   CL 101 07/18/2022 1429   CO2 28 07/18/2022 1429   GLUCOSE 92 07/18/2022 1429   BUN 10 07/18/2022 1429   CREATININE 0.96 07/18/2022 1429   CALCIUM 9.8 07/18/2022 1429   PROT 8.3 07/18/2022 1429   ALBUMIN 5.1 07/18/2022 1429   ALBUMIN 4.7 04/19/2021 1023   AST 36 07/18/2022 1429   ALT 66 (H) 07/18/2022 1429   ALKPHOS 69 07/18/2022 1429   BILITOT 0.4 07/18/2022 1429   GFRNONAA >60 07/19/2021 0849   GFRAA >60 03/03/2018 1446   Lab Results  Component Value Date   CHOL 137 03/15/2022   HDL 45.80 03/15/2022   LDLCALC 57 03/15/2022   LDLDIRECT 86.0 12/11/2021   TRIG 169.0 (H) 03/15/2022   CHOLHDL 3 03/15/2022   No results found for: "HGBA1C" No results found for: "VITAMINB12" Lab Results  Component Value Date   TSH 1.02 12/11/2021       ASSESSMENT AND PLAN  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   Eat well.  Continue aspirin.   Sumatriptan prn migraine RTC 1 year call sooner if new or worsening symptoms.     Json Koelzer A. Epimenio Foot, MD, [facility] 11/26/2022, 10:36 AM Certified in Neurology, Clinical Neurophysiology, Sleep Medicine and Neuroimaging  [facility] [address], [address] [place], [place] [address] [phone]

## 2022-11-30 ENCOUNTER — Other Ambulatory Visit: Payer: Self-pay

## 2022-12-02 ENCOUNTER — Encounter: Payer: Self-pay | Admitting: Internal Medicine

## 2022-12-03 ENCOUNTER — Ambulatory Visit
Admission: EM | Admit: 2022-12-03 | Discharge: 2022-12-03 | Disposition: A | Discharge status: Home / Self Care | Payer: 59

## 2022-12-03 ENCOUNTER — Ambulatory Visit (INDEPENDENT_AMBULATORY_CARE_PROVIDER_SITE_OTHER): Payer: 59

## 2022-12-03 DIAGNOSIS — J329 Chronic sinusitis, unspecified: Secondary | ICD-10-CM | POA: Diagnosis not present

## 2022-12-03 DIAGNOSIS — R059 Cough, unspecified: Secondary | ICD-10-CM | POA: Diagnosis not present

## 2022-12-03 DIAGNOSIS — Z7960 Long term (current) use of unspecified immunomodulators and immunosuppressants: Secondary | ICD-10-CM

## 2022-12-03 DIAGNOSIS — R42 Dizziness and giddiness: Secondary | ICD-10-CM | POA: Diagnosis not present

## 2022-12-03 MED ORDER — AZITHROMYCIN 250 MG PO TABS: ORAL_TABLET | ORAL | 0 refills | Status: AC

## 2022-12-03 NOTE — ED Triage Notes (Signed)
Pt c/o of cold sxs starting on 4/12 with  sore throat, low grade fever, runny nose. Sxs mostly resolved except for runny nose. Last two days N, dizzy like he might pass out while driving. No CP, difficulty breathing.

## 2022-12-03 NOTE — ED Provider Notes (Signed)
[facility]    CSN: [id number] Arrival date & time: 12/03/22  1206    HISTORY   Chief Complaint  Patient presents with   Dizziness   HPI Michael Moon is a pleasant, 27 y.o. male who presents to urgent care today. Patient complains of feeling nauseated, dizzy and like he might pass out while he is driving.  Patient denies chest pain, difficulty breathing, palpitations.  Patient states that 10 days ago he had a cold with symptoms of sore throat, low-grade fever and runny nose which has mostly resolved.  Patient has elevated blood pressure on arrival today with a slightly elevated temperature, O2 sats of 95%.  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  Patient states he no longer feels dizzy or like he is going to pass out at this time.  Patient states he was also diagnosed with COVID-19 in January of this year, he was treated with Paxlovid.  The history is provided by the patient.   Past Medical History:  Diagnosis Date   Abnormal brain MRI 03/2021   Multiple white matter signal abnormalities around the lateral ventricles->? MS, ? CADASIL-advanced blood testing and LP planned by neurology as of 03/2021, plus referral to [facility].   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 Leone Payor). 03/2019 colonoscopy->mild active colitis. Recall 1 yr.   Fundic gland polyps of stomach, benign 10/2020   GERD (gastroesophageal reflux disease)    Hx of adenomatous colonic polyps 03/18/2019    Hyperlipemia, mixed 2019,2020   TLC   Obesity, Class I, BMI 30-34.9    Paresthesias    2022->L side of face, L hand and foot.  Lyme neg. See abnormal MRI in PMH section.   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) 10/02/2021   Family history of genetic disease 07/24/2021   White matter abnormality on MRI of brain 07/24/2021   Left facial numbness 03/31/2021   Tingling 03/31/2021   Hx of adenomatous colonic polyps 03/18/2019   Class 2 obesity due to excess calories with body mass index (BMI) of 38.0 to 38.9 in adult 02/25/2019   Long-term use of immunosuppressant medication - 6 mercaptopurine 01/23/2019   Crohn's disease of colon 11/01/2017   Iron deficiency anemia 01/03/2016   GERD (gastroesophageal reflux disease) 03/13/2012   Allergic rhinitis 08/15/2007   Past Surgical History:  Procedure Laterality Date   COLONOSCOPY  2012;03/16/2019; 10/2020   2012 crohns. 03/2019->2 adenomas, mild active colitis, otherwise in remission. 10/2020-normal.   ESOPHAGOGASTRODUODENOSCOPY  2012   2012.  10/26/20->mild acute and chronic esophagitis (eosin neg)   TONSILLECTOMY AND ADENOIDECTOMY  08/13/2005    Home Medications    Prior to Admission medications   Medication Sig Start Date End Date Taking? Authorizing Provider  cyclobenzaprine (FLEXERIL) 10 MG tablet Take by mouth. 11/24/13  Yes [provider]  fluticasone (FLONASE) 50 MCG/ACT nasal spray Place  into the nose. 01/30/22  Yes [provider]  rosuvastatin (CRESTOR) 20 MG tablet Take 1 tablet by mouth daily. 03/16/22  Yes [provider]  acetaminophen (TYLENOL) 500 MG tablet Take 1,000 mg by mouth every 6 (six) hours as needed for moderate pain.    [provider]  aspirin EC 81 MG tablet Take 81 mg by mouth daily. Swallow whole.    [provider]  azithromycin  (ZITHROMAX) 250 MG tablet Take 2 tablets by mouth on day 1, then one tablet daily for 4 days Patient not taking: Reported on 11/26/2022 09/11/22   Faythe Ghee, PA-C  cetirizine (ZYRTEC) 10 MG tablet Take 10 mg by mouth daily as needed for allergies.    [provider]  cetirizine (ZYRTEC) 10 MG tablet Take by mouth.    [provider]  famotidine (PEPCID) 10 MG tablet Take 10 mg by mouth daily as needed for heartburn or indigestion.    [provider]  fluticasone (FLONASE) 50 MCG/ACT nasal spray Place 2 sprays into both nostrils daily. 05/08/22   McGowen, Maryjean Morn, MD  mercaptopurine (PURINETHOL) 50 MG tablet Take 2 tablets (100 mg total) by mouth daily. Give on an empty stomach 1 hour before or 2 hours after meals. Caution: Chemotherapy. 11/12/22   Iva Boop, MD  mesalamine (LIALDA) 1.2 g EC tablet TAKE 4 TABLETS BY MOUTH DAILY WITH BREAKFAST. 04/20/22 04/20/23  Iva Boop, MD  nirmatrelvir & ritonavir (PAXLOVID, 300/100,) 20 x 150 MG & 10 x 100MG  TBPK Take as directed on manufacturer box. Patient not taking: Reported on 11/26/2022 08/20/22   Trevor Iha, FNP  omeprazole (PRILOSEC) 40 MG capsule TAKE 1 CAPSULE BY MOUTH DAILY. 04/20/22 04/20/23  Iva Boop, MD  predniSONE (DELTASONE) 10 MG tablet Take 3 tablets (30 mg total) by mouth daily for 3 days. Patient not taking: Reported on 11/26/2022 09/11/22   Faythe Ghee, PA-C  rosuvastatin (CRESTOR) 20 MG tablet Take 1 tablet (20 mg total) by mouth daily. 09/10/22   McGowen, Maryjean Morn, MD  SUMAtriptan (IMITREX) 100 MG tablet Take 1 tablet (100 mg total) by mouth once as needed for up to 1 dose for migraine. May repeat in 2 hours if headache persists or recurs. 11/26/22   Sater, Pearletha Furl, MD    Family History Family History  Problem Relation Age of Onset   Arthritis Mother    Hypertension Mother    Brain cancer Father    Hyperlipidemia Father    Hypertension Father    Barrett's esophagus Father        last EGD  showed high-grade dysplasia   Arthritis Maternal Grandmother    Diabetes Maternal Grandmother    Hypertension Maternal Grandmother    Stroke Maternal Grandmother    Stroke Maternal Grandfather    Hearing loss Maternal Grandfather    Alcohol abuse Maternal Grandfather    Alcohol abuse Paternal Grandmother    Hypertension Paternal Grandmother    Alcohol abuse Paternal Grandfather    Early death Paternal Grandfather        unknown cause   Rectal cancer Neg Hx    Colon cancer Neg Hx    Esophageal cancer Neg Hx    Stomach cancer Neg Hx    Pancreatic cancer Neg Hx    Social History Social History   Tobacco Use   Smoking status: Never   Smokeless tobacco: Never  Vaping Use   Vaping Use: Never used  Substance Use Topics  Alcohol use: No   Drug use: No   Allergies   Codeine  Review of Systems Review of Systems Pertinent findings revealed after performing a 14 point review of systems has been noted in the history of present illness.  Physical Exam Vital Signs BP (!) 140/77 (BP Location: Right Arm)   Pulse 98   Temp 99.3 F (37.4 C) (Oral)   Resp 16   SpO2 95%   No data found.  Physical Exam Vitals and nursing note reviewed.  Constitutional:      General: He is not in acute distress.    Appearance: Normal appearance. He is not ill-appearing.  HENT:     Head: Normocephalic and atraumatic.     Salivary Glands: Right salivary gland is not diffusely enlarged or tender. Left salivary gland is not diffusely enlarged or tender.     Right Ear: Hearing, ear canal and external ear normal. No drainage. No middle ear effusion. There is no impacted cerumen. Tympanic membrane is bulging. Tympanic membrane is not erythematous.     Left Ear: Hearing, ear canal and external ear normal. No drainage.  No middle ear effusion. There is no impacted cerumen. Tympanic membrane is bulging. Tympanic membrane is not erythematous.     Ears:     Comments: Bilateral TMs bulging with clear fluid     Nose: Nose normal. No nasal deformity, septal deviation, mucosal edema, congestion or rhinorrhea.     Right Turbinates: Not enlarged, swollen or pale.     Left Turbinates: Not enlarged, swollen or pale.     Right Sinus: No maxillary sinus tenderness or frontal sinus tenderness.     Left Sinus: No maxillary sinus tenderness or frontal sinus tenderness.     Mouth/Throat:     Lips: Pink. No lesions.     Mouth: Mucous membranes are moist. No oral lesions.     Pharynx: Oropharynx is clear. Uvula midline. No posterior oropharyngeal erythema or uvula swelling.     Tonsils: No tonsillar exudate. 0 on the right. 0 on the left.  Eyes:     General: Lids are normal.        Right eye: No discharge.        Left eye: No discharge.     Extraocular Movements: Extraocular movements intact.     Conjunctiva/sclera: Conjunctivae normal.     Right eye: Right conjunctiva is not injected.     Left eye: Left conjunctiva is not injected.  Neck:     Trachea: Trachea and phonation normal.  Cardiovascular:     Rate and Rhythm: Normal rate and regular rhythm.     Pulses: Normal pulses.     Heart sounds: Normal heart sounds. No murmur heard.    No friction rub. No gallop.  Pulmonary:     Effort: Pulmonary effort is normal. No tachypnea, bradypnea, accessory muscle usage, prolonged expiration, respiratory distress or retractions.     Breath sounds: Normal air entry. No stridor, decreased air movement or transmitted upper airway sounds. Examination of the right-middle field reveals decreased breath sounds. Examination of the right-lower field reveals decreased breath sounds. Decreased breath sounds present. No wheezing, rhonchi or rales.  Chest:     Chest wall: No tenderness.  Musculoskeletal:        General: Normal range of motion.     Cervical back: Normal range of motion and neck supple. Normal range of motion.  Lymphadenopathy:     Cervical: No cervical adenopathy.  Skin:    General:  Skin is warm and dry.      Findings: No erythema or rash.  Neurological:     General: No focal deficit present.     Mental Status: He is alert and oriented to person, place, and time.  Psychiatric:        Mood and Affect: Mood normal.        Behavior: Behavior normal.     Visual Acuity Right Eye Distance:   Left Eye Distance:   Bilateral Distance:    Right Eye Near:   Left Eye Near:    Bilateral Near:     UC Couse / Diagnostics / Procedures:     Radiology DG Chest 2 View  Result Date: 12/03/2022 CLINICAL DATA:  Cough EXAM: CHEST - 2 VIEW COMPARISON:  None Available. FINDINGS: The heart size and mediastinal contours are within normal limits. Both lungs are clear. The visualized skeletal structures are unremarkable. IMPRESSION: No active cardiopulmonary disease. Electronically Signed   By: Allegra Lai M.D.   On: 12/03/2022 13:37    Procedures Procedures (including critical care time) EKG  Pending results:  Labs Reviewed  CBC WITH DIFFERENTIAL/PLATELET  COMPREHENSIVE METABOLIC PANEL    Medications Ordered in UC: Medications - No data to display  UC Diagnoses / Final Clinical Impressions(s)   I have reviewed the triage vital signs and the nursing notes.  Pertinent labs & imaging results that were available during my care of the patient were reviewed by me and considered in my medical decision making (see chart for details).    Final diagnoses:  Dizziness  Long-term use of immunosuppressant medication  Rhinosinusitis   Patient was advised of negative chest x-ray findings.  Patient requesting lab work to be performed before he follows up with his ear nose and throat provider.  CBC and CMP were performed today we will notify him of the result via MyChart as well as any further recommendations, if needed.  Patient advised to the emergency room if his dizziness and feeling like he is going to pass out returns.  Because patient is on long-term immune suppression medication, patient provided  with a prescription for azithromycin given his low-grade temperature and decreased O2 sats on arrival.  Please see discharge instructions below for further details of plan of care as provided to patient. ED Prescriptions     Medication Sig Dispense Auth. Provider   azithromycin (ZITHROMAX) 250 MG tablet Take 2 tablets (500 mg total) by mouth daily for 1 day, THEN 1 tablet (250 mg total) daily for 4 days. 6 tablet Theadora Rama Scales, PA-C      PDMP not reviewed this encounter.  Disposition Upon Discharge:  Condition: stable for discharge home Home: take medications as prescribed; routine discharge instructions as discussed; follow up as advised.  Patient presented with an acute illness with associated systemic symptoms and significant discomfort requiring urgent management. In my opinion, this is a condition that a prudent lay person (someone who possesses an average knowledge of health and medicine) may potentially expect to result in complications if not addressed urgently such as respiratory distress, impairment of bodily function or dysfunction of bodily organs.   Routine symptom specific, illness specific and/or disease specific instructions were discussed with the patient and/or caregiver at length.   As such, the patient has been evaluated and assessed, work-up was performed and treatment was provided in alignment with urgent care protocols and evidence based medicine.  Patient/parent/caregiver has been advised that the patient may require follow up for further  testing and treatment if the symptoms continue in spite of treatment, as clinically indicated and appropriate.  If the patient was tested for COVID-19, Influenza and/or RSV, then the patient/parent/guardian was advised to isolate at home pending the results of his/her diagnostic coronavirus test and potentially longer if they're positive. I have also advised pt that if his/her COVID-19 test returns positive, it's recommended to  self-isolate for at least 10 days after symptoms first appeared AND until fever-free for 24 hours without fever reducer AND other symptoms have improved or resolved. Discussed self-isolation recommendations as well as instructions for household member/close contacts as per the [facility] and [place] DHHS, and also gave patient the COVID packet with this information.  Patient/parent/caregiver has been advised to return to the [facility] or PCP in 3-5 days if no better; to PCP or the Emergency Department if new signs and symptoms develop, or if the current signs or symptoms continue to change or worsen for further workup, evaluation and treatment as clinically indicated and appropriate  The patient will follow up with their current PCP if and as advised. If the patient does not currently have a PCP we will assist them in obtaining one.   The patient may need specialty follow up if the symptoms continue, in spite of conservative treatment and management, for further workup, evaluation, consultation and treatment as clinically indicated and appropriate.  Patient/parent/caregiver verbalized understanding and agreement of plan as discussed.  All questions were addressed during visit.  Please see discharge instructions below for further details of plan.  Discharge Instructions:   Discharge Instructions      Your chest x-ray did not reveal any concern for infection at this time.  Because of your history of sinusitis, I think it is important that you do follow-up with ENT for possible repeat scan of your sinuses to look for signs of infection.  Because you are running a low-grade fever at this time and have a history of sinus infections, as well as your long-term use of immune suppressing medications, I do recommend that you begin a 10-day course of antibiotics for sinus infection, I sent a prescription for azithromycin to your pharmacy.  Please take them as prescribed.  If dizziness and feeling like you are going to pass  out reoccurs, recommended to go to the emergency room for further evaluation.  Thank you for visiting urgent care today.      This office note has been dictated using Teaching laboratory technician.  Unfortunately, this method of dictation can sometimes lead to typographical or grammatical errors.  I apologize for your inconvenience in advance if this occurs.  Please do not hesitate to reach out to me if clarification is needed.      Theadora Rama Scales, PA-C 12/03/22 1354

## 2022-12-03 NOTE — Discharge Instructions (Signed)
Your chest x-ray did not reveal any concern for infection at this time.  Because of your history of sinusitis, I think it is important that you do follow-up with ENT for possible repeat scan of your sinuses to look for signs of infection.  Because you are running a low-grade fever at this time and have a history of sinus infections, as well as your long-term use of immune suppressing medications, I do recommend that you begin a 10-day course of antibiotics for sinus infection, I sent a prescription for azithromycin to your pharmacy.  Please take them as prescribed.  If dizziness and feeling like you are going to pass out reoccurs, recommended to go to the emergency room for further evaluation.  Thank you for visiting urgent care today.

## 2022-12-04 ENCOUNTER — Encounter: Payer: Self-pay | Admitting: Neurology

## 2022-12-04 ENCOUNTER — Other Ambulatory Visit: Payer: Self-pay | Admitting: Internal Medicine

## 2022-12-04 DIAGNOSIS — K50118 Crohn's disease of large intestine with other complication: Secondary | ICD-10-CM

## 2022-12-04 DIAGNOSIS — Z7960 Long term (current) use of unspecified immunomodulators and immunosuppressants: Secondary | ICD-10-CM

## 2022-12-04 LAB — COMPREHENSIVE METABOLIC PANEL
ALT: 34 IU/L (ref 0–44)
AST: 25 IU/L (ref 0–40)
Albumin/Globulin Ratio: 1.8 (ref 1.2–2.2)
Albumin: 4.9 g/dL (ref 4.3–5.2)
Alkaline Phosphatase: 94 IU/L (ref 44–121)
BUN/Creatinine Ratio: 9 (ref 9–20)
BUN: 7 mg/dL (ref 6–20)
Bilirubin Total: 0.4 mg/dL (ref 0.0–1.2)
CO2: 22 mmol/L (ref 20–29)
Calcium: 10.2 mg/dL (ref 8.7–10.2)
Chloride: 100 mmol/L (ref 96–106)
Creatinine, Ser: 0.81 mg/dL (ref 0.76–1.27)
Globulin, Total: 2.8 g/dL (ref 1.5–4.5)
Glucose: 93 mg/dL (ref 70–99)
Potassium: 3.8 mmol/L (ref 3.5–5.2)
Sodium: 141 mmol/L (ref 134–144)
Total Protein: 7.7 g/dL (ref 6.0–8.5)
eGFR: 122 mL/min/{1.73_m2} (ref >59)

## 2022-12-04 LAB — CBC WITH DIFFERENTIAL/PLATELET
Basophils Absolute: 0 10*3/uL (ref 0.0–0.2)
Basos: 0 %
EOS (ABSOLUTE): 0 10*3/uL (ref 0.0–0.4)
Eos: 0 %
Hematocrit: 43 % (ref 37.5–51.0)
Hemoglobin: 14.6 g/dL (ref 13.0–17.7)
Immature Grans (Abs): 0.1 10*3/uL (ref 0.0–0.1)
Immature Granulocytes: 1 %
Lymphocytes Absolute: 1.3 10*3/uL (ref 0.7–3.1)
Lymphs: 9 %
MCH: 29 pg (ref 26.6–33.0)
MCHC: 34 g/dL (ref 31.5–35.7)
MCV: 86 fL (ref 79–97)
Monocytes Absolute: 0.6 10*3/uL (ref 0.1–0.9)
Monocytes: 4 %
Neutrophils Absolute: 11.7 10*3/uL — ABNORMAL HIGH (ref 1.4–7.0)
Neutrophils: 86 %
Platelets: 420 10*3/uL (ref 150–450)
RBC: 5.03 x10E6/uL (ref 4.14–5.80)
RDW: 14.7 % (ref 11.6–15.4)
WBC: 13.7 10*3/uL — ABNORMAL HIGH (ref 3.4–10.8)

## 2022-12-06 DIAGNOSIS — J00 Acute nasopharyngitis [common cold]: Secondary | ICD-10-CM | POA: Diagnosis not present

## 2022-12-08 ENCOUNTER — Ambulatory Visit: Payer: 59

## 2022-12-08 ENCOUNTER — Telehealth: Payer: Self-pay | Admitting: Emergency Medicine

## 2022-12-08 NOTE — Telephone Encounter (Signed)
Call to [facility] per providers request to obtain more information about reason for visit later on today. Pt  states he has had a temp since 11/23/22. Temp range is over 99 but doe not go past 100 F. Pt had labs drawn on 12/03/22 and has finished the z-pak from his Urgent Care visit earlier this week. Dr Delton See has directed  pt to follow up with his PCP for further lab work or testing that is not available at [facility]. No other questions at this time

## 2022-12-10 ENCOUNTER — Encounter: Payer: Self-pay | Admitting: Family Medicine

## 2022-12-10 ENCOUNTER — Ambulatory Visit (INDEPENDENT_AMBULATORY_CARE_PROVIDER_SITE_OTHER): Payer: 59 | Admitting: Family Medicine

## 2022-12-10 VITALS — BP 126/86 | HR 120 | Temp 99.5°F | Ht 72.0 in | Wt 265.8 lb

## 2022-12-10 DIAGNOSIS — G9339 Other post infection and related fatigue syndromes: Secondary | ICD-10-CM | POA: Diagnosis not present

## 2022-12-10 DIAGNOSIS — R5081 Fever presenting with conditions classified elsewhere: Secondary | ICD-10-CM | POA: Diagnosis not present

## 2022-12-10 DIAGNOSIS — G9331 Postviral fatigue syndrome: Secondary | ICD-10-CM | POA: Diagnosis not present

## 2022-12-10 DIAGNOSIS — D72825 Bandemia: Secondary | ICD-10-CM

## 2022-12-10 NOTE — Progress Notes (Signed)
OFFICE VISIT  12/10/2022  CC:  Chief Complaint  Patient presents with   Fever    X 18d, last temp check was 99.5; has used Tylenol and Ibuprofen, last dose Tylenol was 11a/1130am   Fatigue    X 5 days, has not had much improvement. Denies cough. He thinks he had a head cold that started 4/12, he went to urgent care last Mon, given rx for Azithromycin.     Patient is a 27 y.o. male who presents for fatigue and fever.  HPI: 18 days ago he got a sore throat and low-grade fever.  Over the next couple days nasal congestion and postnasal drip started.  These symptoms resolved over the next several days as expected for a URI.  His mild fatigue that he had with this has progressed significantly and he has continued to have temperature in the 99-100 range.  Appetite mildly diminished.  He was seen at [facility] urgent care in [place] on 12/03/2022 for dizziness. Reviewed EDPs note from that day. His chief complaints were feeling nauseated dizzy and like he might pass out while he is driving.  His CBC in the emergency department showed a white count of 13,700, elevated neutrophils.  Complete metabolic panel was all normal.  CXR negative (this was done for O2 sat 95% per patient). He was prescribed azithromycin at this urgent care visit for the recent low-grade temperature, decreased O2 sats on arrival in the ED, and his immunosuppressed state from his mercaptopurine. Home covid test NEG 11/25/22 and again about a week ago negative.  ROS as above, plus-->no CP, no SOB, no wheezing, no cough, no HAs, no rashes, no melena/hematochezia.  No polyuria or polydipsia.  No myalgias or arthralgias.  No focal weakness, paresthesias, or tremors.  No acute vision or hearing abnormalities.  No dysuria or unusual/new urinary urgency or frequency.  No recent changes in lower legs. No n/v/d or abd pain.      Past Medical History:  Diagnosis Date   Abnormal brain MRI 03/2021   Multiple white matter signal  abnormalities around the lateral ventricles->? MS, ? CADASIL-advanced blood testing and LP planned by neurology as of 03/2021, plus referral to [facility] MS clinic.   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 Leone Payor). 03/2019 colonoscopy->mild active colitis. Recall 1 yr.   Fundic gland polyps of stomach, benign 10/2020   GERD (gastroesophageal reflux disease)    Hx of adenomatous colonic polyps 03/18/2019   Hyperlipemia, mixed 2019,2020   TLC   Obesity, Class I, BMI 30-34.9    Paresthesias    2022->L side of face, L hand and foot.  Lyme neg. See abnormal MRI in PMH section.   Seasonal allergic rhinitis     Past Surgical History:  Procedure Laterality Date   COLONOSCOPY  2012;03/16/2019; 10/2020   2012 crohns. 03/2019->2 adenomas, mild active colitis, otherwise in remission. 10/2020-normal.   ESOPHAGOGASTRODUODENOSCOPY  2012   2012.  10/26/20->mild acute and chronic esophagitis (eosin neg)   TONSILLECTOMY AND ADENOIDECTOMY  08/13/2005    Outpatient Medications Prior to Visit  Medication Sig Dispense Refill   acetaminophen (TYLENOL) 500 MG tablet Take 1,000 mg by mouth every 6 (six) hours as needed for moderate pain.     aspirin EC 81 MG tablet Take  81 mg by mouth daily. Swallow whole.     famotidine (PEPCID) 10 MG tablet Take 10 mg by mouth daily as needed for heartburn or indigestion.     mercaptopurine (PURINETHOL) 50 MG tablet Take 2 tablets (100 mg total) by mouth daily. Give on an empty stomach 1 hour before or 2 hours after meals. Caution: Chemotherapy. 180 tablet 0   mesalamine (LIALDA) 1.2 g EC tablet TAKE 4 TABLETS BY MOUTH DAILY WITH BREAKFAST. 360 tablet 3   omeprazole (PRILOSEC) 40 MG capsule TAKE 1 CAPSULE BY MOUTH DAILY. 90 capsule 3   rosuvastatin (CRESTOR) 20 MG tablet Take 1 tablet  (20 mg total) by mouth daily. 90 tablet 0   cetirizine (ZYRTEC) 10 MG tablet Take 10 mg by mouth daily as needed for allergies. (Patient not taking: Reported on 12/10/2022)     cyclobenzaprine (FLEXERIL) 10 MG tablet Take by mouth. (Patient not taking: Reported on 12/10/2022)     fluticasone (FLONASE) 50 MCG/ACT nasal spray Place 2 sprays into both nostrils daily. (Patient not taking: Reported on 12/10/2022) 16 g 0   SUMAtriptan (IMITREX) 100 MG tablet Take 1 tablet (100 mg total) by mouth once as needed for up to 1 dose for migraine. May repeat in 2 hours if headache persists or recurs. (Patient not taking: Reported on 12/10/2022) 10 tablet 2   cetirizine (ZYRTEC) 10 MG tablet Take by mouth.     fluticasone (FLONASE) 50 MCG/ACT nasal spray Place into the nose.     rosuvastatin (CRESTOR) 20 MG tablet Take 1 tablet by mouth daily. (Patient not taking: Reported on 12/10/2022)     No facility-administered medications prior to visit.    Allergies  Allergen Reactions   Codeine Rash    Review of Systems  As per HPI  PE:    12/10/2022    3:45 PM 12/03/2022   12:10 PM 11/26/2022   10:08 AM  Vitals with BMI  Height 6\' 0"     Weight 265 lbs 13 oz    BMI 36.04    Systolic 126 140 789  Diastolic 86 77 78  Pulse 120 98 87     Physical Exam  Gen: Alert, well appearing.  Patient is oriented to person, place, time, and situation. AFFECT: pleasant, lucid thought and speech. ENT: Ears: EACs clear, normal epithelium.  TMs with good light reflex and landmarks bilaterally.  Eyes: no injection, icteris, swelling, or exudate.  EOMI, PERRLA. Nose: no drainage or turbinate edema/swelling.  No injection or focal lesion.  Mouth: lips without lesion/swelling.  Oral mucosa pink and moist.  Dentition intact and without obvious caries or gingival swelling.  Oropharynx without erythema, exudate, or swelling.  Neck - No masses, LAD, tenderness, or thyromegaly or limitation in range of motion CV: RRR, no m/r/g.    LUNGS: CTA bilat, nonlabored resps, good aeration in all lung fields. Abd: soft, NT/ND EXT: no clubbing or cyanosis.  no edema.  Skin: No rash, pallor, or jaundice  LABS:  Last CBC Lab Results  Component Value Date   WBC 13.7 (H) 12/03/2022   HGB 14.6 12/03/2022   HCT 43.0 12/03/2022   MCV 86 12/03/2022   MCH 29.0 12/03/2022   RDW 14.7 12/03/2022   PLT 420 12/03/2022   Last metabolic panel Lab Results  Component Value Date   GLUCOSE 93 12/03/2022   NA 141 12/03/2022   K 3.8 12/03/2022   CL 100 12/03/2022   CO2 22 12/03/2022   BUN 7 12/03/2022   CREATININE  0.81 12/03/2022   EGFR 122 12/03/2022   CALCIUM 10.2 12/03/2022   PROT 7.7 12/03/2022   ALBUMIN 4.9 12/03/2022   LABGLOB 2.8 12/03/2022   AGRATIO 1.8 12/03/2022   BILITOT 0.4 12/03/2022   ALKPHOS 94 12/03/2022   AST 25 12/03/2022   ALT 34 12/03/2022   ANIONGAP 7 07/19/2021   Last thyroid functions Lab Results  Component Value Date   TSH 1.02 12/11/2021   IMPRESSION AND PLAN:  Fatigue and low-grade fever. 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 Will follow-up his leukocytosis with a CBC with differential.  Additionally, for completeness will check Monospot, c-Met, TSH, sed rate, and blood cultures.  An After Visit Summary was printed and given to the patient.  FOLLOW UP: Return in about 4 days (around 12/14/2022) for f/u fever and fatigue.  Signed:  Santiago Bumpers, MD           12/10/2022

## 2022-12-11 LAB — CBC WITH DIFFERENTIAL/PLATELET
Basophils Relative: 0.6 % (ref 0.0–3.0)
Eosinophils Relative: 0.3 % (ref 0.0–5.0)
HCT: 45.2 % (ref 39.0–52.0)
Hemoglobin: 15 g/dL (ref 13.0–17.0)
Lymphocytes Relative: 24.8 % (ref 12.0–46.0)
MCHC: 33.3 g/dL (ref 30.0–36.0)
MCV: 86.3 fl (ref 78.0–100.0)
Monocytes Relative: 0.4 % — ABNORMAL LOW (ref 3.0–12.0)
Neutrophils Relative %: 73.9 % (ref 43.0–77.0)
Platelets: 451 10*3/uL — ABNORMAL HIGH (ref 150.0–400.0)
RBC: 5.24 Mil/uL (ref 4.22–5.81)
RDW: 15.1 % (ref 11.5–15.5)
WBC: 5.8 10*3/uL (ref 4.0–10.5)

## 2022-12-11 LAB — CULTURE, BLOOD (SINGLE)
MICRO NUMBER:: 14886928
MICRO NUMBER:: 14886935

## 2022-12-11 LAB — COMPREHENSIVE METABOLIC PANEL
ALT: 27 U/L (ref 0–53)
AST: 20 U/L (ref 0–37)
Albumin: 5 g/dL (ref 3.5–5.2)
Alkaline Phosphatase: 74 U/L (ref 39–117)
BUN: 10 mg/dL (ref 6–23)
CO2: 23 mEq/L (ref 19–32)
Calcium: 10 mg/dL (ref 8.4–10.5)
Chloride: 104 mEq/L (ref 96–112)
Creatinine, Ser: 0.77 mg/dL (ref 0.40–1.50)
GFR: 121.37 mL/min (ref >60.00)
Glucose, Bld: 85 mg/dL (ref 70–99)
Potassium: 3.8 mEq/L (ref 3.5–5.1)
Sodium: 140 mEq/L (ref 135–145)
Total Bilirubin: 0.4 mg/dL (ref 0.2–1.2)
Total Protein: 8.1 g/dL (ref 6.0–8.3)

## 2022-12-11 LAB — SEDIMENTATION RATE: Sed Rate: 48 mm/hr — ABNORMAL HIGH (ref 0–15)

## 2022-12-11 LAB — MONONUCLEOSIS SCREEN: Mono Screen: NEGATIVE

## 2022-12-11 LAB — TSH: TSH: 0.97 u[IU]/mL (ref 0.35–5.50)

## 2022-12-12 ENCOUNTER — Ambulatory Visit: Payer: 59 | Admitting: Internal Medicine

## 2022-12-14 ENCOUNTER — Telehealth: Payer: Self-pay | Admitting: Family Medicine

## 2022-12-14 ENCOUNTER — Other Ambulatory Visit
Admission: RE | Admit: 2022-12-14 | Discharge: 2022-12-14 | Disposition: A | Discharge status: Home / Self Care | Payer: 59 | Source: Ambulatory Visit | Attending: Family Medicine | Admitting: Family Medicine

## 2022-12-14 ENCOUNTER — Other Ambulatory Visit: Payer: Self-pay | Admitting: Family Medicine

## 2022-12-14 DIAGNOSIS — R5081 Fever presenting with conditions classified elsewhere: Secondary | ICD-10-CM | POA: Insufficient documentation

## 2022-12-14 DIAGNOSIS — R5383 Other fatigue: Secondary | ICD-10-CM | POA: Diagnosis not present

## 2022-12-14 DIAGNOSIS — D72829 Elevated white blood cell count, unspecified: Secondary | ICD-10-CM

## 2022-12-14 DIAGNOSIS — R197 Diarrhea, unspecified: Secondary | ICD-10-CM

## 2022-12-14 DIAGNOSIS — K50118 Crohn's disease of large intestine with other complication: Secondary | ICD-10-CM

## 2022-12-14 DIAGNOSIS — Z7960 Long term (current) use of unspecified immunomodulators and immunosuppressants: Secondary | ICD-10-CM

## 2022-12-14 NOTE — Telephone Encounter (Signed)
Michael Moon would like a call to follow up and discuss blood work and labs results from his last visit.

## 2022-12-14 NOTE — Addendum Note (Signed)
Addended by: Esperanza Sheets on: 12/14/2022 06:26 PM   Modules accepted: Orders

## 2022-12-14 NOTE — Telephone Encounter (Signed)
Please advise on results from 4/29

## 2022-12-16 LAB — EPSTEIN-BARR VIRUS (EBV) ANTIBODY PROFILE
EBV NA IgG: <18 U/mL (ref 0.0–17.9)
EBV VCA IgG: <18 U/mL (ref 0.0–17.9)
EBV VCA IgM: <36 U/mL (ref 0.0–35.9)

## 2022-12-16 LAB — CULTURE, BLOOD (SINGLE)
Result:: NO GROWTH
Result:: NO GROWTH
SPECIMEN QUALITY:: ADEQUATE
SPECIMEN QUALITY:: ADEQUATE

## 2023-01-07 ENCOUNTER — Other Ambulatory Visit: Payer: Self-pay | Admitting: Family Medicine

## 2023-01-07 ENCOUNTER — Other Ambulatory Visit: Payer: Self-pay

## 2023-01-08 ENCOUNTER — Other Ambulatory Visit: Payer: Self-pay

## 2023-01-11 ENCOUNTER — Other Ambulatory Visit: Payer: Self-pay

## 2023-01-11 ENCOUNTER — Telehealth: Payer: Self-pay | Admitting: Family Medicine

## 2023-01-11 MED ORDER — ROSUVASTATIN CALCIUM 20 MG PO TABS
20.0000 mg | ORAL_TABLET | Freq: Every day | ORAL | 0 refills | Status: DC
  Filled 2023-01-11: qty 30, 30d supply, fill #0

## 2023-01-11 NOTE — Telephone Encounter (Signed)
Patient is scheduled for 6/12 but he is needing a refill on   rosuvastatin (CRESTOR) 20 MG tablet   Pharmacy is [facility]

## 2023-01-11 NOTE — Telephone Encounter (Signed)
Refill sent.

## 2023-01-21 ENCOUNTER — Ambulatory Visit: Payer: 59 | Admitting: Family Medicine

## 2023-01-23 ENCOUNTER — Ambulatory Visit: Payer: 59 | Admitting: Family Medicine

## 2023-01-25 ENCOUNTER — Other Ambulatory Visit: Payer: Self-pay

## 2023-01-25 ENCOUNTER — Ambulatory Visit: Payer: 59 | Admitting: Internal Medicine

## 2023-01-25 ENCOUNTER — Encounter: Payer: Self-pay | Admitting: Internal Medicine

## 2023-01-25 ENCOUNTER — Other Ambulatory Visit (INDEPENDENT_AMBULATORY_CARE_PROVIDER_SITE_OTHER): Payer: 59

## 2023-01-25 VITALS — BP 136/80 | HR 81 | Ht 72.0 in | Wt 266.0 lb

## 2023-01-25 DIAGNOSIS — K219 Gastro-esophageal reflux disease without esophagitis: Secondary | ICD-10-CM

## 2023-01-25 DIAGNOSIS — Z7960 Long term (current) use of unspecified immunomodulators and immunosuppressants: Secondary | ICD-10-CM | POA: Diagnosis not present

## 2023-01-25 DIAGNOSIS — K50118 Crohn's disease of large intestine with other complication: Secondary | ICD-10-CM | POA: Diagnosis not present

## 2023-01-25 LAB — CBC WITH DIFFERENTIAL/PLATELET
Basophils Absolute: 0.1 10*3/uL (ref 0.0–0.1)
Basophils Relative: 1.1 % (ref 0.0–3.0)
Eosinophils Absolute: 0 10*3/uL (ref 0.0–0.7)
Eosinophils Relative: 0.5 % (ref 0.0–5.0)
HCT: 40.4 % (ref 39.0–52.0)
Hemoglobin: 13.3 g/dL (ref 13.0–17.0)
Lymphocytes Relative: 25.7 % (ref 12.0–46.0)
Lymphs Abs: 1.8 10*3/uL (ref 0.7–4.0)
MCHC: 33 g/dL (ref 30.0–36.0)
MCV: 86.9 fl (ref 78.0–100.0)
Monocytes Absolute: 0.4 10*3/uL (ref 0.1–1.0)
Monocytes Relative: 5.5 % (ref 3.0–12.0)
Neutro Abs: 4.8 10*3/uL (ref 1.4–7.7)
Neutrophils Relative %: 67.2 % (ref 43.0–77.0)
Platelets: 422 10*3/uL — ABNORMAL HIGH (ref 150.0–400.0)
RBC: 4.64 Mil/uL (ref 4.22–5.81)
RDW: 15.3 % (ref 11.5–15.5)
WBC: 7.1 10*3/uL (ref 4.0–10.5)

## 2023-01-25 LAB — COMPREHENSIVE METABOLIC PANEL
ALT: 28 U/L (ref 0–53)
AST: 20 U/L (ref 0–37)
Albumin: 4.6 g/dL (ref 3.5–5.2)
Alkaline Phosphatase: 63 U/L (ref 39–117)
BUN: 14 mg/dL (ref 6–23)
CO2: 26 mEq/L (ref 19–32)
Calcium: 9.4 mg/dL (ref 8.4–10.5)
Chloride: 103 mEq/L (ref 96–112)
Creatinine, Ser: 0.75 mg/dL (ref 0.40–1.50)
GFR: 122.23 mL/min (ref >60.00)
Glucose, Bld: 92 mg/dL (ref 70–99)
Potassium: 3.6 mEq/L (ref 3.5–5.1)
Sodium: 139 mEq/L (ref 135–145)
Total Bilirubin: 0.4 mg/dL (ref 0.2–1.2)
Total Protein: 7.7 g/dL (ref 6.0–8.3)

## 2023-01-25 MED ORDER — OMEPRAZOLE 40 MG PO CPDR
40.0000 mg | DELAYED_RELEASE_CAPSULE | Freq: Every day | ORAL | 3 refills | Status: DC
  Filled 2023-01-25: qty 90, 90d supply, fill #0
  Filled 2023-10-31: qty 90, 90d supply, fill #1

## 2023-01-25 MED ORDER — MERCAPTOPURINE 50 MG PO TABS
100.0000 mg | ORAL_TABLET | Freq: Every day | ORAL | 0 refills | Status: DC
  Filled 2023-01-25: qty 180, 90d supply, fill #0

## 2023-01-25 MED ORDER — MESALAMINE 1.2 G PO TBEC
4.8000 g | DELAYED_RELEASE_TABLET | Freq: Every day | ORAL | 3 refills | Status: DC
  Filled 2023-01-25: qty 360, 90d supply, fill #0
  Filled 2023-10-31: qty 360, 90d supply, fill #1

## 2023-01-25 NOTE — Patient Instructions (Signed)
Your provider has requested that you go to the basement level for lab work before leaving today. Press "B" on the elevator. The lab is located at the first door on the left as you exit the elevator.  Due to recent changes in healthcare laws, you may see the results of your imaging and laboratory studies on MyChart before your provider has had a chance to review them.  We understand that in some cases there may be results that are confusing or concerning to you. Not all laboratory results come back in the same time frame and the provider may be waiting for multiple results in order to interpret others.  Please give [place] 48 hours in order for your provider to thoroughly review all the results before contacting the office for clarification of your results.   We have sent medications to your pharmacy for you to pick up at your convenience.   You need a colonoscopy this year. Once you see how your new insurance works let us know.   Good luck with your new job.   I appreciate the opportunity to care for you. Denice Paradise, [facility]

## 2023-01-25 NOTE — Progress Notes (Signed)
Michael Moon 27 y.o. [birth year] [id number]  Assessment & Plan:   Encounter Diagnoses  Name Primary?   Crohn's disease of colon with other complication (HCC) Yes   Long-term use of immunosuppressant medication    Gastroesophageal reflux disease, unspecified whether esophagitis present    The patient is in clinical remission.  He is due for a colonoscopy he is changing jobs and Development worker, community, we will hold off on that for the time being and he will update me after he learns about his new insurance.  Should he need to change to a [facility] physician we can help facilitate that he used to see Dr. Steele Sizer so hopefully Dr. Chesley Mires would see him again.  Continue current therapy.  Refill medications for Crohn's disease and GERD.  Continue monitoring of high risk medication.  Orders Placed This Encounter  Procedures   CBC with Differential/Platelet   Comprehensive metabolic panel   Thiopurine Metabolites     Meds ordered this encounter  Medications   mesalamine (LIALDA) 1.2 g EC tablet    Sig: Take 4 tablets (4.8 g total) by mouth daily with breakfast.    Dispense:  360 tablet    Refill:  3   mercaptopurine (PURINETHOL) 50 MG tablet    Sig: Take 2 tablets (100 mg total) by mouth daily. Give on an empty stomach 1 hour before or 2 hours after meals. Caution: Chemotherapy.    Dispense:  180 tablet    Refill:  0   omeprazole (PRILOSEC) 40 MG capsule    Sig: TAKE 1 CAPSULE BY MOUTH DAILY.    Dispense:  90 capsule    Refill:  3        Subjective:   Gastroenterology summary:   diagnosed with Crohn's colitis in 2012 based on colonoscopy with mild active chronic colitis including crypt distortion and abscess formation without granulomas (path scanned from 2012). He experienced bloody diarrhea and a 40 pound weight loss. He was found to have active inflammation in his colon and reflux esophagitis. He was treated with steroids, Asacol, and . He required a hospitalization for  IV steroids. He then did well and steroids were tapered off. He remained on and Asacol for several years and did well. In 2014 he continued but stopped Asacol and had some mild symptoms. Dr. Steele Sizer continued and added Lialda and he has generally done well since then.  2019 transferred care to Dr. Leone Payor continued 6-MP and Lialda some fluctuation in levels of drug-2020 colonoscopy with patchy inflammation lower levels of 6-TG dose of 6-MP increased from 100-1 50 extraneously drip 6-TG level lower and 6 MMP levels higher with some abnormal transaminases and that resolved.  10/26/2020 colonoscopy pseudopolyps otherwise normal, sees without dysplasia or active colitis-remission   GERD-EGD 10/26/2020 grade a esophagitis, biopsies consistent with GERD, fundic gland polyp-treated with omeprazole and as needed Pepcid   Chief Complaint: Follow-up of Crohn's colitis-doing well  HPI [name] reports regular bowel movements no pain no bleeding.  He has been compliant with lab follow-up regarding his 6-MP and in April LFTs and CBC were normal.  Last seen in December when he had some transient diarrhea but that resolved and we thought was a transient infection.  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  Wonders if he will still be able to see me he hopes to. Allergies  Allergen Reactions   Codeine Rash  Current Meds  Medication Sig   acetaminophen (TYLENOL) 500 MG tablet Take 1,000 mg by mouth every 6 (six) hours as needed for moderate pain.   aspirin EC 81 MG tablet Take 81 mg by mouth daily. Swallow whole.   cetirizine (ZYRTEC) 10 MG tablet Take 10 mg by mouth daily as needed for allergies.   cyclobenzaprine (FLEXERIL) 10 MG tablet Take by mouth.   famotidine (PEPCID) 10 MG tablet Take 10 mg by mouth daily as needed for heartburn or indigestion.   fluticasone (FLONASE) 50 MCG/ACT nasal spray Place 2 sprays into both nostrils daily.    rosuvastatin (CRESTOR) 20 MG tablet Take 1 tablet (20 mg total) by mouth daily.   SUMAtriptan (IMITREX) 100 MG tablet Take 1 tablet (100 mg total) by mouth once as needed for up to 1 dose for migraine. May repeat in 2 hours if headache persists or recurs.   [mercaptopurine (PURINETHOL) 50 MG tablet Take 2 tablets (100 mg total) by mouth daily. Give on an empty stomach 1 hour before or 2 hours after meals. Caution: Chemotherapy.    mesalamine (LIALDA) 1.2 g EC tablet TAKE 4 TABLETS BY MOUTH DAILY WITH BREAKFAST.    omeprazole (PRILOSEC) 40 MG capsule TAKE 1 CAPSULE BY MOUTH DAILY.   Past Medical History:  Diagnosis Date   Abnormal brain MRI 03/2021   Multiple white matter signal abnormalities around the lateral ventricles->? MS, ? CADASIL-advanced blood testing and LP planned by neurology as of 03/2021, plus referral to [facility] MS clinic.   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 Leone Payor). 03/2019 colonoscopy->mild active colitis. Recall 1 yr.   Fundic gland polyps of stomach, benign 10/2020   GERD (gastroesophageal reflux disease)    Hx of adenomatous colonic polyps 03/18/2019   Hyperlipemia, mixed 2019,2020   TLC   Obesity, Class I, BMI 30-34.9    Paresthesias    2022->L side of face, L hand and foot.  Lyme neg. See abnormal MRI in PMH section.   Seasonal allergic rhinitis    Past Surgical History:  Procedure Laterality Date   COLONOSCOPY  2012;03/16/2019; 10/2020   2012 crohns. 03/2019->2 adenomas, mild active colitis, otherwise in remission. 10/2020-normal.   ESOPHAGOGASTRODUODENOSCOPY  2012   2012.  10/26/20->mild acute and chronic esophagitis (eosin neg)   TONSILLECTOMY AND ADENOIDECTOMY  08/13/2005   Social History   Social History Narrative   Single, no children.   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   No T/A/Ds.   Lives with mom in [place]   Right handed   Caffeine use: coffee 3-4 times per week, sweet tea sometimes, little soda   family history includes Alcohol abuse in his maternal grandfather, paternal grandfather, and paternal grandmother; Arthritis in his maternal grandmother and mother; Barrett's esophagus in his father; Brain cancer in his father; Diabetes in his maternal grandmother; Early death in his paternal grandfather; Hearing loss in his maternal grandfather; Hyperlipidemia in his father; Hypertension in his father, maternal grandmother, mother, and paternal grandmother; Stroke in his maternal grandfather and maternal grandmother.   Review of Systems As per HPI  Objective:   Physical Exam @BP  136/80   Pulse 81   Ht 6' (1.829 m)   Wt 266 lb (120.7 kg)  BMI 36.08 kg/m @  General:  NAD Eyes:   anicteric Lungs:  clear Heart::  S1S2 no rubs, murmurs or gallops Abdomen:  soft and nontender, BS+ Ext:   no edema, cyanosis or clubbing    Data Reviewed:  See HPI

## 2023-01-29 LAB — THIOPURINE METABOLITES
6 MMP(6-Methylmercaptopurine): 619 pmol/8x10(8)RBC (ref <5700)
6 TG(6-Thioguanine): 142 pmol/8x10(8)RBC — ABNORMAL LOW (ref 235–400)

## 2023-04-12 ENCOUNTER — Other Ambulatory Visit: Payer: Self-pay

## 2023-04-12 MED ORDER — VITAMIN D3 25 MCG (1000 UNIT) PO TABS
25.0000 ug | ORAL_TABLET | Freq: Every day | ORAL | 3 refills | Status: DC
  Filled 2023-04-12: qty 100, 100d supply, fill #0

## 2023-05-20 ENCOUNTER — Other Ambulatory Visit: Payer: Self-pay | Admitting: Neurology

## 2023-05-20 ENCOUNTER — Encounter: Payer: Self-pay | Admitting: Neurology

## 2023-05-20 DIAGNOSIS — I67850 Cerebral autosomal dominant arteriopathy with subcortical infarcts and leukoencephalopathy: Secondary | ICD-10-CM

## 2023-05-22 ENCOUNTER — Telehealth: Payer: Self-pay | Admitting: Neurology

## 2023-05-22 NOTE — Telephone Encounter (Signed)
Referral for neurology fax to [facility]. Phone: [phone], Fax: [phone]

## 2023-09-12 ENCOUNTER — Telehealth: Payer: Self-pay | Admitting: Internal Medicine

## 2023-09-12 NOTE — Telephone Encounter (Signed)
Good afternoon Dr. Leone Payor,   Patient called requesting to follow up with you in the office, patient was last seen with you in 01/2023. Patient was last seen with Dr. Allayne Gitelman at [facility] in 04/2023 in the office. Patient states his insurance is no longer in network with them. Can you please advise on scheduling?    Thank you

## 2023-09-12 NOTE — Telephone Encounter (Signed)
He left me due to insurance - if he needs to return that is fine to schedule him with me again. Next available unless he is having problems then let me know

## 2023-10-31 ENCOUNTER — Other Ambulatory Visit (HOSPITAL_BASED_OUTPATIENT_CLINIC_OR_DEPARTMENT_OTHER): Payer: Self-pay

## 2023-10-31 ENCOUNTER — Other Ambulatory Visit: Payer: Self-pay | Admitting: Internal Medicine

## 2023-10-31 ENCOUNTER — Ambulatory Visit: Admitting: Family Medicine

## 2023-10-31 ENCOUNTER — Encounter: Payer: Self-pay | Admitting: Family Medicine

## 2023-10-31 VITALS — BP 125/78 | HR 80 | Temp 98.8°F | Ht 73.75 in | Wt 265.2 lb

## 2023-10-31 DIAGNOSIS — E782 Mixed hyperlipidemia: Secondary | ICD-10-CM

## 2023-10-31 DIAGNOSIS — Z0000 Encounter for general adult medical examination without abnormal findings: Secondary | ICD-10-CM | POA: Diagnosis not present

## 2023-10-31 DIAGNOSIS — F411 Generalized anxiety disorder: Secondary | ICD-10-CM | POA: Diagnosis not present

## 2023-10-31 LAB — CBC WITH DIFFERENTIAL/PLATELET
Basophils Absolute: 0 10*3/uL (ref 0.0–0.1)
Basophils Relative: 0.2 % (ref 0.0–3.0)
Eosinophils Absolute: 0 10*3/uL (ref 0.0–0.7)
Eosinophils Relative: 0.6 % (ref 0.0–5.0)
HCT: 41.9 % (ref 39.0–52.0)
Hemoglobin: 14.1 g/dL (ref 13.0–17.0)
Lymphocytes Relative: 28.9 % (ref 12.0–46.0)
Lymphs Abs: 2 10*3/uL (ref 0.7–4.0)
MCHC: 33.6 g/dL (ref 30.0–36.0)
MCV: 90.1 fl (ref 78.0–100.0)
Monocytes Absolute: 0.4 10*3/uL (ref 0.1–1.0)
Monocytes Relative: 5.4 % (ref 3.0–12.0)
Neutro Abs: 4.6 10*3/uL (ref 1.4–7.7)
Neutrophils Relative %: 64.9 % (ref 43.0–77.0)
Platelets: 422 10*3/uL — ABNORMAL HIGH (ref 150.0–400.0)
RBC: 4.65 Mil/uL (ref 4.22–5.81)
RDW: 15.1 % (ref 11.5–15.5)
WBC: 7 10*3/uL (ref 4.0–10.5)

## 2023-10-31 LAB — COMPREHENSIVE METABOLIC PANEL
ALT: 28 U/L (ref 0–53)
AST: 20 U/L (ref 0–37)
Albumin: 5 g/dL (ref 3.5–5.2)
Alkaline Phosphatase: 65 U/L (ref 39–117)
BUN: 16 mg/dL (ref 6–23)
CO2: 30 meq/L (ref 19–32)
Calcium: 10 mg/dL (ref 8.4–10.5)
Chloride: 100 meq/L (ref 96–112)
Creatinine, Ser: 0.78 mg/dL (ref 0.40–1.50)
GFR: 120.14 mL/min (ref >60.00)
Glucose, Bld: 85 mg/dL (ref 70–99)
Potassium: 4 meq/L (ref 3.5–5.1)
Sodium: 139 meq/L (ref 135–145)
Total Bilirubin: 0.6 mg/dL (ref 0.2–1.2)
Total Protein: 7.6 g/dL (ref 6.0–8.3)

## 2023-10-31 LAB — LIPID PANEL
Cholesterol: 136 mg/dL (ref 0–200)
HDL: 48.9 mg/dL (ref >39.00)
LDL Cholesterol: 49 mg/dL (ref 0–99)
NonHDL: 87.29
Total CHOL/HDL Ratio: 3
Triglycerides: 190 mg/dL — ABNORMAL HIGH (ref 0.0–149.0)
VLDL: 38 mg/dL (ref 0.0–40.0)

## 2023-10-31 LAB — TSH: TSH: 0.92 u[IU]/mL (ref 0.35–5.50)

## 2023-10-31 MED ORDER — ROSUVASTATIN CALCIUM 20 MG PO TABS
20.0000 mg | ORAL_TABLET | Freq: Every day | ORAL | 3 refills | Status: AC
  Filled 2023-10-31: qty 90, 90d supply, fill #0
  Filled 2024-02-05: qty 90, 90d supply, fill #1
  Filled 2024-05-29: qty 90, 90d supply, fill #2
  Filled 2024-08-31: qty 90, 90d supply, fill #3

## 2023-10-31 MED ORDER — ALPRAZOLAM 0.5 MG PO TABS
0.5000 mg | ORAL_TABLET | Freq: Two times a day (BID) | ORAL | 1 refills | Status: DC | PRN
  Filled 2023-10-31: qty 30, 15d supply, fill #0
  Filled 2024-02-05: qty 30, 15d supply, fill #1

## 2023-10-31 NOTE — Progress Notes (Signed)
 Office Note 10/31/2023  CC:  Chief Complaint  Patient presents with   Annual Exam    Pt is fasting    HPI:  Patient is a 28 y.o. male who is here for annual health maintenance exam and mixed hyperlipidemia.  He feels well. He had to switch providers last year because of a change in insurance that came along with a new job at [employer].  He has now switched back to [employer] and is working at [employer] so his insurance changed back and he can come to me now.  Asymptomatic from the standpoint of his CADASIL, is happy with his current neurologist with [facility]. He plans on switching back to Dr. Leone Payor in gastroenterology.  He says he had a colonoscopy last year and it was normal.  He remains on Lialda and mercaptopurine.  He also remains on omeprazole for GERD. He is on Nurtec for as needed migraine use.  He should not take triptans because of the potential problem with vasoconstriction and its effect on his CADASIL.  His PCP put him on as needed Xanax 0.5 mg because he was having some extra stress with the change in his job.  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 PMP AWARE reviewed today: most recent rx for alprazolam 0.5mg  was filled 08/08/24, # 30, rx by Leslee Home, MD. No red flags.   Past Medical History:  Diagnosis Date   Abnormal brain MRI 03/2021   Multiple white matter signal abnormalities around the lateral ventricles->? MS, ? CADASIL-advanced blood testing and LP planned by neurology as of 03/2021, plus referral to [facility].   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 Leone Payor). 03/2019 colonoscopy->mild active colitis. Recall 1 yr.   Fundic gland polyps of stomach, benign  10/2020   GERD (gastroesophageal reflux disease)    Hx of adenomatous colonic polyps 03/18/2019   Hyperlipemia, mixed 2019,2020   TLC   Obesity, Class I, BMI 30-34.9    Paresthesias    2022->L side of face, L hand and foot.  Lyme neg. See abnormal MRI in PMH section.   Seasonal allergic rhinitis     Past Surgical History:  Procedure Laterality Date   COLONOSCOPY  2012;03/16/2019; 10/2020   2012 crohns. 03/2019->2 adenomas, mild active colitis, otherwise in remission. 10/2020-normal.   ESOPHAGOGASTRODUODENOSCOPY  2012   2012.  10/26/20->mild acute and chronic esophagitis (eosin neg)   TONSILLECTOMY AND ADENOIDECTOMY  08/13/2005    Family History  Problem Relation Age of Onset   Arthritis Mother    Hypertension Mother    Brain cancer Father    Hyperlipidemia Father    Hypertension Father    Barrett's esophagus Father        last EGD showed high-grade dysplasia   Arthritis Maternal Grandmother    Diabetes Maternal Grandmother    Hypertension Maternal Grandmother    Stroke Maternal Grandmother    Stroke Maternal Grandfather    Hearing loss Maternal Grandfather    Alcohol abuse Maternal Grandfather    Alcohol abuse Paternal Grandmother    Hypertension Paternal Grandmother    Alcohol abuse Paternal Grandfather    Early death Paternal Actor  unknown cause   Rectal cancer Neg Hx    Colon cancer Neg Hx    Esophageal cancer Neg Hx    Stomach cancer Neg Hx    Pancreatic cancer Neg Hx     Social History   Socioeconomic History   Marital status: Single    Spouse name: Not on file   Number of children: 0   Years of education: Not on file   Highest education level: Associate degree: occupational, Scientist, product/process development, or vocational program  Occupational History   Occupation: MRI Retail buyer: [employer]  Tobacco Use   Smoking status: Never   Smokeless tobacco: Never  Vaping Use   Vaping status: Never Used  Substance and Sexual Activity   Alcohol use: No    Drug use: No   Sexual activity: Not on file  Other Topics Concern   Not on file  Social History Narrative   Single, no children.   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   No T/A/Ds.   Lives with mom in [place]   Right handed   Caffeine use: coffee 3-4 times per week, sweet tea sometimes, little soda   Social Drivers of Health   Financial Resource Strain: Low Risk  (10/30/2023)   Overall Financial Resource Strain (CARDIA)    Difficulty of Paying Living Expenses: Not hard at all  Food Insecurity: No Food Insecurity (10/30/2023)   Hunger Vital Sign    Worried About Running Out of Food in the Last Year: Never true    Ran Out of Food in the Last Year: Never true  Transportation Needs: No Transportation Needs (10/30/2023)   PRAPARE - Administrator, Civil Service (Medical): No    Lack of Transportation (Non-Medical): No  Physical Activity: Insufficiently Active (10/30/2023)   Exercise Vital Sign    Days of Exercise per Week: 3 days    Minutes of Exercise per Session: 30 min  Stress: Stress Concern Present (10/30/2023)   [employer] of Occupational Health - Occupational Stress Questionnaire    Feeling of Stress : To some extent  Social Connections: Moderately Isolated (10/30/2023)   Social Connection and Isolation Panel [NHANES]    Frequency of Communication with Friends and Family: More than three times a week    Frequency of Social Gatherings with Friends and Family: Twice a week    Attends Religious Services: 1 to 4 times per year    Active Member of [employer] or Organizations: No    Attends Engineer, structural: Not on file    Marital Status: Never married  Intimate Partner Violence: Not on file    Outpatient Medications Prior to Visit  Medication Sig Dispense Refill   acetaminophen (TYLENOL) 500 MG tablet Take 1,000 mg by mouth every 6 (six) hours as needed for moderate pain.     aspirin EC 81 MG tablet Take 81 mg by  mouth daily. Swallow whole.     cetirizine (ZYRTEC) 10 MG tablet Take 10 mg by mouth daily as needed for allergies.     cyclobenzaprine (FLEXERIL) 10 MG tablet Take by mouth.     famotidine (PEPCID) 10 MG tablet Take 10 mg by mouth daily as needed for heartburn or indigestion.     mercaptopurine (PURINETHOL) 50 MG tablet Take 2 tablets (100 mg total) by mouth daily. Give on an empty stomach 1 hour before or 2 hours after meals. Caution: Chemotherapy. 180 tablet 0   mesalamine (LIALDA)  1.2 g EC tablet Take 4 tablets (4.8 g total) by mouth daily with breakfast. 360 tablet 3   omeprazole (PRILOSEC) 40 MG capsule Take 1 capsule (40 mg total) by mouth daily. 90 capsule 3   rosuvastatin (CRESTOR) 20 MG tablet Take 1 tablet (20 mg total) by mouth daily. 30 tablet 0   ALPRAZolam (XANAX) 0.5 MG tablet Take 0.5 mg by mouth as needed for anxiety or sleep. (Patient not taking: Reported on 10/31/2023)     fluticasone (FLONASE) 50 MCG/ACT nasal spray Place 2 sprays into both nostrils daily. 16 g 0   SUMAtriptan (IMITREX) 100 MG tablet Take 1 tablet (100 mg total) by mouth once as needed for up to 1 dose for migraine. May repeat in 2 hours if headache persists or recurs. 10 tablet 2   No facility-administered medications prior to visit.    Allergies  Allergen Reactions   Codeine Rash    Review of Systems  Constitutional:  Negative for appetite change, chills, fatigue and fever.  HENT:  Negative for congestion, dental problem, ear pain and sore throat.   Eyes:  Negative for discharge, redness and visual disturbance.  Respiratory:  Negative for cough, chest tightness, shortness of breath and wheezing.   Cardiovascular:  Negative for chest pain, palpitations and leg swelling.  Gastrointestinal:  Negative for abdominal pain, blood in stool, diarrhea, nausea and vomiting.  Genitourinary:  Negative for difficulty urinating, dysuria, flank pain, frequency, hematuria and urgency.  Musculoskeletal:  Negative for  arthralgias, back pain, joint swelling, myalgias and neck stiffness.  Skin:  Negative for pallor and rash.  Neurological:  Negative for dizziness, speech difficulty, weakness and headaches.  Hematological:  Negative for adenopathy. Does not bruise/bleed easily.  Psychiatric/Behavioral:  Negative for confusion and sleep disturbance. The patient is not nervous/anxious.     PE;    10/31/2023    1:03 PM 01/25/2023    8:20 AM 12/10/2022    3:45 PM  Vitals with BMI  Height 6' 1.75" 6\' 0"  6\' 0"   Weight 265 lbs 3 oz 266 lbs 265 lbs 13 oz  BMI 34.29 36.07 36.04  Systolic 125 136 161  Diastolic 78 80 86  Pulse 80 81 120    Gen: Alert, well appearing.  Patient is oriented to person, place, time, and situation. AFFECT: pleasant, lucid thought and speech. ENT: Ears: EACs clear, normal epithelium.  TMs with good light reflex and landmarks bilaterally.  Eyes: no injection, icteris, swelling, or exudate.  EOMI, PERRLA. Nose: no drainage or turbinate edema/swelling.  No injection or focal lesion.  Mouth: lips without lesion/swelling.  Oral mucosa pink and moist.  Dentition intact and without obvious caries or gingival swelling.  Oropharynx without erythema, exudate, or swelling.  Neck: supple/nontender.  No LAD, mass, or TM.  Carotid pulses 2+ bilaterally, without bruits. CV: RRR, no m/r/g.   LUNGS: CTA bilat, nonlabored resps, good aeration in all lung fields. ABD: soft, NT, ND, BS normal.  No hepatospenomegaly or mass.  No bruits. EXT: no clubbing, cyanosis, or edema.  Musculoskeletal: no joint swelling, erythema, warmth, or tenderness.  ROM of all joints intact. Skin - no sores or suspicious lesions or rashes or color changes  Pertinent labs:  Lab Results  Component Value Date   TSH 0.97 12/10/2022   Lab Results  Component Value Date   WBC 7.1 01/25/2023   HGB 13.3 01/25/2023   HCT 40.4 01/25/2023   MCV 86.9 01/25/2023   PLT 422.0 (H) 01/25/2023   Lab  Results  Component Value Date    CREATININE 0.75 01/25/2023   BUN 14 01/25/2023   NA 139 01/25/2023   K 3.6 01/25/2023   CL 103 01/25/2023   CO2 26 01/25/2023   Lab Results  Component Value Date   ALT 28 01/25/2023   AST 20 01/25/2023   ALKPHOS 63 01/25/2023   BILITOT 0.4 01/25/2023   Lab Results  Component Value Date   CHOL 137 03/15/2022   Lab Results  Component Value Date   HDL 45.80 03/15/2022   Lab Results  Component Value Date   LDLCALC 57 03/15/2022   Lab Results  Component Value Date   TRIG 169.0 (H) 03/15/2022   Lab Results  Component Value Date   CHOLHDL 3 03/15/2022   ASSESSMENT AND PLAN:   #1 health maintenance exam: Reviewed age and gender appropriate health maintenance issues (prudent diet, regular exercise, health risks of tobacco and excessive alcohol, use of seatbelts, fire alarms in home, use of sunscreen).  Also reviewed age and gender appropriate health screening as well as vaccine recommendations. Vaccines: ALL UTD. Labs: Health panel today. colon ca screening:  He carries dx of crohn's dz. Most recent colonoscopy by Dr. Leone Payor 10/2020--> normal.  He had a colonoscopy last year with his [facility] gastroenterologist that he reports is normal.  Demarr says he will be reestablishing with Dr. Leone Payor since his insurance has changed again.  #2 hypercholesterolemia. With his CADASIL he should have a goal LDL of less than 70. Continue rosuvastatin 20 mg a day and check lipid panel today.  #3 anxiety. As needed alprazolam 0.5 mg is helping well.  He uses this infrequently. I will assume responsibility for prescribing this for him now: 0.5 mg tabs, 1 twice daily as needed, #30, refill x 1.  An After Visit Summary was printed and given to the patient.  FOLLOW UP:  No follow-ups on file.  Signed:  Santiago Bumpers, MD           10/31/2023

## 2023-11-01 ENCOUNTER — Encounter: Payer: Self-pay | Admitting: Family Medicine

## 2023-11-01 ENCOUNTER — Other Ambulatory Visit: Payer: Self-pay

## 2023-11-04 ENCOUNTER — Other Ambulatory Visit (HOSPITAL_BASED_OUTPATIENT_CLINIC_OR_DEPARTMENT_OTHER): Payer: Self-pay

## 2023-11-05 ENCOUNTER — Other Ambulatory Visit: Payer: Self-pay

## 2023-11-05 ENCOUNTER — Other Ambulatory Visit (HOSPITAL_BASED_OUTPATIENT_CLINIC_OR_DEPARTMENT_OTHER): Payer: Self-pay

## 2023-11-05 MED ORDER — MERCAPTOPURINE 50 MG PO TABS
100.0000 mg | ORAL_TABLET | Freq: Every day | ORAL | 0 refills | Status: DC
Start: 2023-11-05 — End: 2024-02-05
  Filled 2023-11-05: qty 180, 90d supply, fill #0

## 2023-11-05 NOTE — Telephone Encounter (Signed)
 Michael Moon called in and said he is back working for [employer] and will be seeing Dr Leone Payor for his GI care. He requested a refill on his mercaptopurine and I made him a yearly follow up appointment. Confirmed pharmacy and sent in refill.

## 2023-11-06 ENCOUNTER — Other Ambulatory Visit (HOSPITAL_BASED_OUTPATIENT_CLINIC_OR_DEPARTMENT_OTHER): Payer: Self-pay

## 2023-11-12 ENCOUNTER — Telehealth: Payer: Self-pay | Admitting: Neurology

## 2023-11-12 NOTE — Telephone Encounter (Signed)
 Pt cancelling appt due to seeing another neurologist

## 2023-11-28 ENCOUNTER — Ambulatory Visit: Payer: 59 | Admitting: Neurology

## 2023-11-30 ENCOUNTER — Encounter

## 2023-12-01 ENCOUNTER — Telehealth: Admitting: Family Medicine

## 2023-12-01 DIAGNOSIS — U071 COVID-19: Secondary | ICD-10-CM

## 2023-12-01 MED ORDER — NIRMATRELVIR/RITONAVIR (PAXLOVID)TABLET: 3.0000 | ORAL_TABLET | Freq: Two times a day (BID) | ORAL | 0 refills | Status: AC

## 2023-12-01 MED ORDER — BENZONATATE 200 MG PO CAPS: 200.0000 mg | ORAL_CAPSULE | Freq: Two times a day (BID) | ORAL | 0 refills | Status: AC | PRN

## 2023-12-01 NOTE — Progress Notes (Signed)
 Virtual Visit Consent   Michael Moon, you are scheduled for a virtual visit with a [facility] provider today. Just as with appointments in the office, your consent must be obtained to participate. Your consent will be active for this visit and any virtual visit you may have with one of our providers in the next 365 days. If you have a MyChart account, a copy of this consent can be sent to you electronically.  As this is a virtual visit, video technology does not allow for your provider to perform a traditional examination. This may limit your provider's ability to fully assess your condition. If your provider identifies any concerns that need to be evaluated in person or the need to arrange testing (such as labs, EKG, etc.), we will make arrangements to do so. Although advances in technology are sophisticated, we cannot ensure that it will always work on either your end or our end. If the connection with a video visit is poor, the visit may have to be switched to a telephone visit. With either a video or telephone visit, we are not always able to ensure that we have a secure connection.  By engaging in this virtual visit, you consent to the provision of healthcare and authorize for your insurance to be billed (if applicable) for the services provided during this visit. Depending on your insurance coverage, you may receive a charge related to this service.  I need to obtain your verbal consent now. Are you willing to proceed with your visit today? Michael Moon has provided verbal consent on 12/01/2023 for a virtual visit (video or telephone). Albertha Huger, FNP  Date: 12/01/2023 11:45 AM   Virtual Visit via Video Note   I, Albertha Huger, connected with  Michael Moon  ([id number], 1996-05-06) on 12/01/23 at 11:45 AM EDT by a video-enabled telemedicine application and verified that I am speaking with the correct person using two identifiers.  Location: Patient: Virtual Visit Location Patient:  Home Provider: Virtual Visit Location Provider: Home Office   I discussed the limitations of evaluation and management by telemedicine and the availability of in person appointments. The patient expressed understanding and agreed to proceed.    History of Present Illness: Michael Moon is a 28 y.o. who identifies as a male who was assigned male at birth, and is being seen today for covid positive testing at home yesterday and today. Sx for 1 day with fever, chills, body aches, cough, head congestion, no wheezing or sob. He has chronic health conditions. See PMH. .  HPI: HPI  Problems:  Patient Active Problem List   Diagnosis Date Noted   CADASIL (cerebral autosomal dominant arteriopathy with subcortical infarcts and leukoencephalopathy) 10/02/2021   Family history of genetic disease 07/24/2021   White matter abnormality on MRI of brain 07/24/2021   Left facial numbness 03/31/2021   Tingling 03/31/2021   Hx of adenomatous colonic polyps 03/18/2019   Class 2 obesity due to excess calories with body mass index (BMI) of 38.0 to 38.9 in adult 02/25/2019   Long-term use of immunosuppressant medication - 6 mercaptopurine  01/23/2019   Crohn's disease of colon (HCC) 11/01/2017   Iron deficiency anemia 01/03/2016   GERD (gastroesophageal reflux disease) 03/13/2012   Allergic rhinitis 08/15/2007    Allergies:  Allergies  Allergen Reactions   Codeine Rash   Medications:  Current Outpatient Medications:    acetaminophen (TYLENOL) 500 MG tablet, Take 1,000 mg by mouth every 6 (six) hours as needed for  moderate pain., Disp: , Rfl:    ALPRAZolam  (XANAX ) 0.5 MG tablet, Take 1 tablet (0.5 mg total) by mouth 2 (two) times daily as needed for anxiety., Disp: 30 tablet, Rfl: 1   aspirin EC 81 MG tablet, Take 81 mg by mouth daily. Swallow whole., Disp: , Rfl:    cetirizine (ZYRTEC) 10 MG tablet, Take 10 mg by mouth daily as needed for allergies., Disp: , Rfl:    cyclobenzaprine (FLEXERIL) 10 MG  tablet, Take by mouth., Disp: , Rfl:    famotidine (PEPCID) 10 MG tablet, Take 10 mg by mouth daily as needed for heartburn or indigestion., Disp: , Rfl:    mercaptopurine  (PURINETHOL ) 50 MG tablet, Take 2 tablets (100 mg total) by mouth daily on an empty stomach 1 hour before or 2 hours after meals. *Caution: Chemotherapy.*, Disp: 180 tablet, Rfl: 0   mesalamine  (LIALDA ) 1.2 g EC tablet, Take 4 tablets (4.8 g total) by mouth daily with breakfast., Disp: 360 tablet, Rfl: 3   omeprazole  (PRILOSEC) 40 MG capsule, Take 1 capsule (40 mg total) by mouth daily., Disp: 90 capsule, Rfl: 3   Rimegepant Sulfate (NURTEC) 75 MG TBDP, Take 1 tablet by mouth daily as needed (HA)., Disp: , Rfl:    rosuvastatin  (CRESTOR ) 20 MG tablet, Take 1 tablet (20 mg total) by mouth daily., Disp: 90 tablet, Rfl: 3  Observations/Objective: Patient is well-developed, well-nourished in no acute distress.  Resting comfortably  at home.  Head is normocephalic, atraumatic.  No labored breathing.  Speech is clear and coherent with logical content.  Patient is alert and oriented at baseline.    Assessment and Plan: 1. COVID (Primary)  Increase fluids, humidifier at night, tylenol or ibuprofen as directed, UC if sx worsen. VIT D and zinc. Quarantine discussed.  Follow Up Instructions: I discussed the assessment and treatment plan with the patient. The patient was provided an opportunity to ask questions and all were answered. The patient agreed with the plan and demonstrated an understanding of the instructions.  A copy of instructions were sent to the patient via MyChart unless otherwise noted below.     The patient was advised to call back or seek an in-person evaluation if the symptoms worsen or if the condition fails to improve as anticipated.    Tericka Devincenzi, FNP

## 2023-12-01 NOTE — Patient Instructions (Signed)

## 2024-02-05 ENCOUNTER — Ambulatory Visit: Admitting: Internal Medicine

## 2024-02-05 ENCOUNTER — Other Ambulatory Visit: Payer: Self-pay

## 2024-02-05 ENCOUNTER — Encounter: Payer: Self-pay | Admitting: Internal Medicine

## 2024-02-05 ENCOUNTER — Other Ambulatory Visit (INDEPENDENT_AMBULATORY_CARE_PROVIDER_SITE_OTHER)

## 2024-02-05 ENCOUNTER — Other Ambulatory Visit (HOSPITAL_BASED_OUTPATIENT_CLINIC_OR_DEPARTMENT_OTHER): Payer: Self-pay

## 2024-02-05 VITALS — BP 142/84 | HR 74 | Ht 72.0 in | Wt 273.0 lb

## 2024-02-05 DIAGNOSIS — K5010 Crohn's disease of large intestine without complications: Secondary | ICD-10-CM | POA: Diagnosis not present

## 2024-02-05 DIAGNOSIS — Z7960 Long term (current) use of unspecified immunomodulators and immunosuppressants: Secondary | ICD-10-CM

## 2024-02-05 DIAGNOSIS — K219 Gastro-esophageal reflux disease without esophagitis: Secondary | ICD-10-CM | POA: Diagnosis not present

## 2024-02-05 LAB — CBC WITH DIFFERENTIAL/PLATELET
Basophils Absolute: 0.1 10*3/uL (ref 0.0–0.1)
Basophils Relative: 1 % (ref 0.0–3.0)
Eosinophils Absolute: 0 10*3/uL (ref 0.0–0.7)
Eosinophils Relative: 0.5 % (ref 0.0–5.0)
HCT: 41.4 % (ref 39.0–52.0)
Hemoglobin: 14.1 g/dL (ref 13.0–17.0)
Lymphocytes Relative: 21.3 % (ref 12.0–46.0)
Lymphs Abs: 1.5 10*3/uL (ref 0.7–4.0)
MCHC: 34.2 g/dL (ref 30.0–36.0)
MCV: 87.8 fl (ref 78.0–100.0)
Monocytes Absolute: 0.4 10*3/uL (ref 0.1–1.0)
Monocytes Relative: 5.2 % (ref 3.0–12.0)
Neutro Abs: 5.1 10*3/uL (ref 1.4–7.7)
Neutrophils Relative %: 72 % (ref 43.0–77.0)
Platelets: 368 10*3/uL (ref 150.0–400.0)
RBC: 4.71 Mil/uL (ref 4.22–5.81)
RDW: 15 % (ref 11.5–15.5)
WBC: 7.2 10*3/uL (ref 4.0–10.5)

## 2024-02-05 LAB — HEPATIC FUNCTION PANEL
ALT: 32 U/L (ref 0–53)
AST: 23 U/L (ref 0–37)
Albumin: 4.8 g/dL (ref 3.5–5.2)
Alkaline Phosphatase: 62 U/L (ref 39–117)
Bilirubin, Direct: 0.1 mg/dL (ref 0.0–0.3)
Total Bilirubin: 0.5 mg/dL (ref 0.2–1.2)
Total Protein: 7.9 g/dL (ref 6.0–8.3)

## 2024-02-05 MED ORDER — MESALAMINE 1.2 G PO TBEC
4.8000 g | DELAYED_RELEASE_TABLET | Freq: Every day | ORAL | 3 refills | Status: AC
  Filled 2024-02-05: qty 360, 90d supply, fill #0
  Filled 2024-05-29: qty 360, 90d supply, fill #1
  Filled 2024-08-31: qty 360, 90d supply, fill #2

## 2024-02-05 MED ORDER — MERCAPTOPURINE 50 MG PO TABS
100.0000 mg | ORAL_TABLET | Freq: Every day | ORAL | 3 refills | Status: AC
  Filled 2024-02-05: qty 180, 90d supply, fill #0
  Filled 2024-05-29: qty 180, 90d supply, fill #1
  Filled 2024-09-01: qty 180, 90d supply, fill #2

## 2024-02-05 MED ORDER — SHINGRIX 50 MCG/0.5ML IM SUSR: 0.5000 mL | Freq: Once | INTRAMUSCULAR | 0 refills | Status: AC

## 2024-02-05 NOTE — Progress Notes (Signed)
 Michael Moon 28 y.o. 1996/06/24 [id number]  Assessment & Plan:   Encounter Diagnoses  Name Primary?   Crohn's disease of colon without complication (HCC) Yes   Long-term use of immunosuppressant medication    Gastroesophageal reflux disease, unspecified whether esophagitis present     Overall he is doing well with respect to his Crohn's disease and GERD.  I will refill his medications today as requested.  Continue 6-MP, mesalamine  pantoprazole and as needed famotidine.  CBC, LFT thiopurine metabolites today  Return to clinic in 1 year or sooner as needed  Repeat colonoscopy September 2026  I have recommended Shingrix vaccine given his immunosuppressed status and the increased risk of shingles in the setting of 6-MP therapy.  Patient has been vaccinated for chickenpox but the risk of shingle still exists.  Lab Results  Component Value Date   WBC 7.2 02/05/2024   HGB 14.1 02/05/2024   HCT 41.4 02/05/2024   MCV 87.8 02/05/2024   PLT 368.0 02/05/2024   Lab Results  Component Value Date   ALT 32 02/05/2024   AST 23 02/05/2024   ALKPHOS 62 02/05/2024   BILITOT 0.5 02/05/2024     Subjective:  Gastroenterology summary:     diagnosed with Crohn's colitis in 2012 based on colonoscopy with mild active chronic colitis including crypt distortion and abscess formation without granulomas (path scanned from 2012). He experienced bloody diarrhea and a 40 pound weight loss. He was found to have active inflammation in his colon and reflux esophagitis. He was treated with steroids, Asacol , and 6MP. He required a hospitalization for IV steroids. He then did well and steroids were tapered off. He remained on and Asacol  for several years and did well. In 2014 he continued 6MP but stopped Asacol  and had some mild symptoms. Dr. Hayes continued 6MP and added Lialda  and he has generally done well since then.   2019 transferred care to Dr. Avram continued 6-MP and Lialda  some  fluctuation in levels of drug-2020 colonoscopy with patchy inflammation lower levels of 6-TG dose of 6-MP increased from 100-1 50 extraneously drip 6-TG level lower and 6 MMP levels higher with some abnormal transaminases and that resolved.   10/26/2020 colonoscopy pseudopolyps otherwise normal, sees without dysplasia or active colitis-remission  05/10/2023 colonoscopy [facility] with pseudopolyps biopsy showing some chronic colitis otherwise mucosa looks normal including terminal ileum     GERD-EGD 10/26/2020 grade a esophagitis, biopsies consistent with GERD, fundic gland polyp-treated with omeprazole  and as needed Pepcid  EGD 05/03/2023 [facility]-normal   Chief Complaint:  HPI  March 2025 normal CMET CBC platelets 422 otherwise normal hemoglobin hematocrit white count December 2024 CMET was normal CBC with hemoglobin 13.5 hematocrit 40 white count 10.6 platelets 408 05/10/2023 [facility]  EGD-normal  Colonoscopy multiple pseudopolyps 3 to 6 mm distal transverse descending and sigmoid colon-biopsied otherwise normal-appearing colon and terminal ileum  Pathology focal active colitis and changes consistent with chronic colitis without granulomas  Colonoscopy 2022-[facility]) pseudopolyps, mild edema on some biopsies but no active colitis Allergies  Allergen Reactions   Codeine Rash   Current Meds  Medication Sig   acetaminophen (TYLENOL) 500 MG tablet Take 1,000 mg by mouth every 6 (six) hours as needed for moderate pain.   ALPRAZolam  (XANAX ) 0.5 MG tablet Take 1 tablet (0.5 mg total) by mouth 2 (two) times daily as needed for anxiety.   aspirin EC 81 MG tablet Take 81 mg by mouth daily. Swallow whole.  cetirizine (ZYRTEC) 10 MG tablet Take 10 mg by mouth daily as needed for allergies.   cyclobenzaprine (FLEXERIL) 10 MG tablet Take by mouth.   famotidine (PEPCID) 10 MG tablet Take 10 mg by mouth daily as needed for heartburn or indigestion.    mercaptopurine  (PURINETHOL ) 50 MG tablet Take 2 tablets (100 mg total) by mouth daily on an empty stomach 1 hour before or 2 hours after meals. *Caution: Chemotherapy.*   mesalamine  (LIALDA ) 1.2 g EC tablet Take 4 tablets (4.8 g total) by mouth daily with breakfast.   omeprazole  (PRILOSEC) 40 MG capsule Take 1 capsule (40 mg total) by mouth daily.   Rimegepant Sulfate (NURTEC) 75 MG TBDP Take 1 tablet by mouth daily as needed (HA).   rosuvastatin  (CRESTOR ) 20 MG tablet Take 1 tablet (20 mg total) by mouth daily.   Zoster Vaccine Adjuvanted [facility]) injection Inject 0.5 mLs into the muscle once for 1 dose. Repeat x 1 per prescribing guidelines Patient is immunocompromised   Past Medical History:  Diagnosis Date   Abnormal brain MRI 03/2021   Multiple white matter signal abnormalities around the lateral ventricles->? MS, ? CADASIL-advanced blood testing and LP planned by neurology as of 03/2021, plus referral to [facility] MS clinic.   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 Avram). 03/2019 colonoscopy->mild active colitis. Recall 1 yr.   Fundic gland polyps of stomach, benign 10/2020   GERD (gastroesophageal reflux disease)    Hx of adenomatous colonic polyps 03/18/2019   Hyperlipemia, mixed 2019,2020   Migraine syndrome    avoid triptans d/t dx of CADASIL   Obesity, Class I, BMI 30-34.9    Paresthesias    2022->L side of face, L hand and foot.  Lyme neg. See abnormal MRI in PMH section.   Seasonal allergic rhinitis    Past Surgical History:  Procedure Laterality Date   COLONOSCOPY  2012;03/16/2019; 10/2020   2012 crohns. 03/2019->2 adenomas, mild active colitis, otherwise in remission. 10/2020-normal.   ESOPHAGOGASTRODUODENOSCOPY  2012   2012.  10/26/20->mild acute and chronic esophagitis (eosin neg)   TONSILLECTOMY AND ADENOIDECTOMY   08/13/2005   Social History   Social History Narrative   Single, no children.   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   No T/A/Ds.   Lives with mom in [place]   Right handed   Caffeine use: coffee 3-4 times per week, sweet tea sometimes, little soda   family history includes Alcohol abuse in his maternal grandfather, paternal grandfather, and paternal grandmother; Arthritis in his maternal grandmother and mother; Barrett's esophagus in his father; Brain cancer in his father; Diabetes in his maternal grandmother; Early death in his paternal grandfather; Hearing loss in his maternal grandfather; Hyperlipidemia in his father; Hypertension in his father, maternal grandmother, mother, and paternal grandmother; Stroke in his maternal grandfather and maternal grandmother.   Review of Systems As per HPI  Objective:   Physical Exam @BP  (!) 142/84   Pulse 74   Ht 6' (1.829 m)   Wt 273 lb (123.8 kg)   BMI 37.03 kg/m @  General:  NAD Eyes:   anicteric Lungs:  clear Heart::  S1S2 no rubs, murmurs or gallops Abdomen:  soft and nontender, BS+ Ext:   no edema, cyanosis or clubbing    Data Reviewed:  See HPI

## 2024-02-05 NOTE — Patient Instructions (Signed)
 Your provider has requested that you go to the basement level for lab work before leaving today. Press B on the elevator. The lab is located at the first door on the left as you exit the elevator.  Due to recent changes in healthcare laws, you may see the results of your imaging and laboratory studies on MyChart before your provider has had a chance to review them.  We understand that in some cases there may be results that are confusing or concerning to you. Not all laboratory results come back in the same time frame and the provider may be waiting for multiple results in order to interpret others.  Please give us  48 hours in order for your provider to thoroughly review all the results before contacting the office for clarification of your results.   We have provided you with a Shingrix vaccine Rx today.  We have sent  medications to your pharmacy for you to pick up at your convenience.  We will place a colonoscopy recall for September 2026.   I appreciate the opportunity to care for you. Lupita Commander, MD, [facility]

## 2024-02-07 ENCOUNTER — Ambulatory Visit: Payer: Self-pay | Admitting: Internal Medicine

## 2024-02-07 DIAGNOSIS — Z7960 Long term (current) use of unspecified immunomodulators and immunosuppressants: Secondary | ICD-10-CM

## 2024-02-07 DIAGNOSIS — K5010 Crohn's disease of large intestine without complications: Secondary | ICD-10-CM

## 2024-02-10 LAB — THIOPURINE METABOLITES
6 MMP(6-Methylmercaptopurine): <500 pmol/8x10(8)RBC (ref <5700)
6 TG(6-Thioguanine): 183 pmol/8x10(8)RBC — ABNORMAL LOW (ref 235–400)

## 2024-03-06 ENCOUNTER — Other Ambulatory Visit (HOSPITAL_BASED_OUTPATIENT_CLINIC_OR_DEPARTMENT_OTHER): Payer: Self-pay

## 2024-03-06 MED ORDER — NURTEC 75 MG PO TBDP
75.0000 mg | ORAL_TABLET | ORAL | 6 refills | Status: AC
  Filled 2024-03-06: qty 8, 30d supply, fill #0
  Filled 2024-06-15: qty 8, 30d supply, fill #1
  Filled 2024-07-21: qty 8, 30d supply, fill #2

## 2024-03-09 ENCOUNTER — Other Ambulatory Visit (HOSPITAL_BASED_OUTPATIENT_CLINIC_OR_DEPARTMENT_OTHER): Payer: Self-pay

## 2024-03-10 ENCOUNTER — Other Ambulatory Visit (HOSPITAL_BASED_OUTPATIENT_CLINIC_OR_DEPARTMENT_OTHER): Payer: Self-pay

## 2024-03-12 ENCOUNTER — Other Ambulatory Visit (HOSPITAL_BASED_OUTPATIENT_CLINIC_OR_DEPARTMENT_OTHER): Payer: Self-pay

## 2024-03-13 ENCOUNTER — Other Ambulatory Visit (HOSPITAL_BASED_OUTPATIENT_CLINIC_OR_DEPARTMENT_OTHER): Payer: Self-pay

## 2024-03-16 ENCOUNTER — Other Ambulatory Visit (HOSPITAL_BASED_OUTPATIENT_CLINIC_OR_DEPARTMENT_OTHER): Payer: Self-pay

## 2024-03-17 ENCOUNTER — Other Ambulatory Visit (HOSPITAL_BASED_OUTPATIENT_CLINIC_OR_DEPARTMENT_OTHER): Payer: Self-pay

## 2024-03-18 ENCOUNTER — Other Ambulatory Visit (HOSPITAL_BASED_OUTPATIENT_CLINIC_OR_DEPARTMENT_OTHER): Payer: Self-pay

## 2024-03-19 ENCOUNTER — Other Ambulatory Visit (HOSPITAL_BASED_OUTPATIENT_CLINIC_OR_DEPARTMENT_OTHER): Payer: Self-pay

## 2024-03-20 ENCOUNTER — Other Ambulatory Visit (HOSPITAL_BASED_OUTPATIENT_CLINIC_OR_DEPARTMENT_OTHER): Payer: Self-pay

## 2024-03-23 ENCOUNTER — Other Ambulatory Visit (HOSPITAL_BASED_OUTPATIENT_CLINIC_OR_DEPARTMENT_OTHER): Payer: Self-pay

## 2024-03-23 ENCOUNTER — Encounter (HOSPITAL_BASED_OUTPATIENT_CLINIC_OR_DEPARTMENT_OTHER): Payer: Self-pay

## 2024-03-24 ENCOUNTER — Other Ambulatory Visit (HOSPITAL_BASED_OUTPATIENT_CLINIC_OR_DEPARTMENT_OTHER): Payer: Self-pay

## 2024-03-25 ENCOUNTER — Other Ambulatory Visit (HOSPITAL_BASED_OUTPATIENT_CLINIC_OR_DEPARTMENT_OTHER): Payer: Self-pay

## 2024-03-26 ENCOUNTER — Other Ambulatory Visit (HOSPITAL_BASED_OUTPATIENT_CLINIC_OR_DEPARTMENT_OTHER): Payer: Self-pay

## 2024-04-20 ENCOUNTER — Other Ambulatory Visit: Payer: Self-pay | Admitting: Family Medicine

## 2024-04-21 ENCOUNTER — Other Ambulatory Visit (HOSPITAL_BASED_OUTPATIENT_CLINIC_OR_DEPARTMENT_OTHER): Payer: Self-pay

## 2024-04-21 MED ORDER — ALPRAZOLAM 0.5 MG PO TABS
0.5000 mg | ORAL_TABLET | Freq: Two times a day (BID) | ORAL | 0 refills | Status: DC | PRN
  Filled 2024-04-21: qty 30, 15d supply, fill #0

## 2024-04-21 NOTE — Telephone Encounter (Signed)
 Refill requested for Alprazolam  sent to [facility]  Last OV 3/20 Next OV 9/17

## 2024-04-22 ENCOUNTER — Other Ambulatory Visit (HOSPITAL_BASED_OUTPATIENT_CLINIC_OR_DEPARTMENT_OTHER): Payer: Self-pay

## 2024-04-22 MED ORDER — FLUZONE 0.5 ML IM SUSY
0.5000 mL | PREFILLED_SYRINGE | Freq: Once | INTRAMUSCULAR | 0 refills | Status: AC
  Filled 2024-04-22: qty 0.5, 1d supply, fill #0

## 2024-04-29 ENCOUNTER — Encounter: Payer: Self-pay | Admitting: Family Medicine

## 2024-04-29 ENCOUNTER — Ambulatory Visit: Admitting: Family Medicine

## 2024-04-29 VITALS — BP 117/77 | HR 79 | Temp 98.4°F | Ht 72.0 in | Wt 279.4 lb

## 2024-04-29 DIAGNOSIS — I67850 Cerebral autosomal dominant arteriopathy with subcortical infarcts and leukoencephalopathy: Secondary | ICD-10-CM | POA: Diagnosis not present

## 2024-04-29 DIAGNOSIS — F419 Anxiety disorder, unspecified: Secondary | ICD-10-CM

## 2024-04-29 LAB — LIPID PANEL
Cholesterol: 158 mg/dL (ref 0–200)
HDL: 47.4 mg/dL (ref >39.00)
LDL Cholesterol: 58 mg/dL (ref 0–99)
NonHDL: 110.84
Total CHOL/HDL Ratio: 3
Triglycerides: 263 mg/dL — ABNORMAL HIGH (ref 0.0–149.0)
VLDL: 52.6 mg/dL — ABNORMAL HIGH (ref 0.0–40.0)

## 2024-04-29 NOTE — Progress Notes (Signed)
 OFFICE VISIT  04/29/2024  CC:  Chief Complaint  Patient presents with   Anxiety    Follow up    Patient is a 28 y.o. male who presents for 48-month follow-up anxiety. A/P as of last visit: `#1 hypercholesterolemia. With his CADASIL he should have a goal LDL of less than 70. Continue rosuvastatin  20 mg a day and check lipid panel today.   #2 anxiety. As needed alprazolam  0.5 mg is helping well.  He uses this infrequently. I will assume responsibility for prescribing this for him now: 0.5 mg tabs, 1 twice daily as needed, #30, refill x 1.  INTERIM HX: Labs were all excellent at his last visit with me. He feels well.  The alprazolam  is working well.  He takes it on a as needed basis. No new concerns today  He is taking rosuvastatin  20 mg a day without problem. He is fasting today.  PMP AWARE reviewed today: most recent rx for alprazolam  0.5 mg was filled 04/21/2024, # 30, rx by me. No red flags.   Past Medical History:  Diagnosis Date   Abnormal brain MRI 03/2021   Multiple white matter signal abnormalities around the lateral ventricles->? MS, ? CADASIL-advanced blood testing and LP planned by neurology as of 03/2021, plus referral to [facility] MS clinic.   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 Avram). 03/2019 colonoscopy->mild active colitis. Recall 1 yr.   Fundic gland polyps of stomach, benign 10/2020   GERD (gastroesophageal reflux disease)    Hx of adenomatous colonic polyps 03/18/2019   Hyperlipemia, mixed 2019,2020   Migraine syndrome    avoid triptans d/t dx of CADASIL   Obesity, Class I, BMI 30-34.9    Paresthesias    2022->L side of face, L hand and foot.  Lyme neg. See abnormal MRI in PMH section.   Seasonal allergic rhinitis     Past Surgical History:  Procedure Laterality Date   COLONOSCOPY   2012;03/16/2019; 10/2020   2012 crohns. 03/2019->2 adenomas, mild active colitis, otherwise in remission. 10/2020-normal.   ESOPHAGOGASTRODUODENOSCOPY  2012   2012.  10/26/20->mild acute and chronic esophagitis (eosin neg)   TONSILLECTOMY AND ADENOIDECTOMY  08/13/2005    Outpatient Medications Prior to Visit  Medication Sig Dispense Refill   acetaminophen (TYLENOL) 500 MG tablet Take 1,000 mg by mouth every 6 (six) hours as needed for moderate pain.     ALPRAZolam  (XANAX ) 0.5 MG tablet Take 1 tablet (0.5 mg total) by mouth 2 (two) times daily as needed for anxiety. 30 tablet 0   aspirin EC 81 MG tablet Take 81 mg by mouth daily. Swallow whole.     cetirizine (ZYRTEC) 10 MG tablet Take 10 mg by mouth daily as needed for allergies.     cyclobenzaprine (FLEXERIL) 10 MG tablet Take by mouth.     famotidine (PEPCID) 10 MG tablet Take 10 mg by mouth daily as needed for heartburn or indigestion.     mercaptopurine  (PURINETHOL ) 50 MG tablet Take 2 tablets (100 mg total) by mouth daily on an empty stomach 1 hour before or 2 hours after meals. *Caution: Chemotherapy.* 180 tablet 3   mesalamine  (LIALDA ) 1.2 g EC tablet Take 4 tablets (4.8 g total) by mouth daily with breakfast. 360 tablet 3   omeprazole  (PRILOSEC) 40 MG capsule Take 1 capsule (40 mg total) by  mouth daily. 90 capsule 3   Rimegepant Sulfate  (NURTEC) 75 MG TBDP Take 1 tablet by mouth daily as needed (HA).     Rimegepant Sulfate  (NURTEC) 75 MG TBDP Dissolve 1 tablet (75 mg total) on tongue as needed for migraine to abort migraine attack. 8 tablet 6   rosuvastatin  (CRESTOR ) 20 MG tablet Take 1 tablet (20 mg total) by mouth daily. 90 tablet 3   No facility-administered medications prior to visit.    Allergies  Allergen Reactions   Codeine Rash    Review of Systems As per HPI  PE:    04/29/2024    1:04 PM 02/05/2024    8:31 AM 10/31/2023    1:03 PM  Vitals with BMI  Height 6' 0 6' 0 6' 1.75  Weight 279 lbs 6 oz 273 lbs 265 lbs 3 oz   BMI 37.89 37.02 34.29  Systolic 117 142 874  Diastolic 77 84 78  Pulse 79 74 80     Physical Exam  Gen: Alert, well appearing.  Patient is oriented to person, place, time, and situation. AFFECT: pleasant, lucid thought and speech. No further exam today  LABS:  Last CBC Lab Results  Component Value Date   WBC 7.2 02/05/2024   HGB 14.1 02/05/2024   HCT 41.4 02/05/2024   MCV 87.8 02/05/2024   MCH 29.0 12/03/2022   RDW 15.0 02/05/2024   PLT 368.0 02/05/2024   Last metabolic panel Lab Results  Component Value Date   GLUCOSE 85 10/31/2023   NA 139 10/31/2023   K 4.0 10/31/2023   CL 100 10/31/2023   CO2 30 10/31/2023   BUN 16 10/31/2023   CREATININE 0.78 10/31/2023   GFR 120.14 10/31/2023   CALCIUM  10.0 10/31/2023   PROT 7.9 02/05/2024   ALBUMIN 4.8 02/05/2024   LABGLOB 2.8 12/03/2022   AGRATIO 1.8 12/03/2022   BILITOT 0.5 02/05/2024   ALKPHOS 62 02/05/2024   AST 23 02/05/2024   ALT 32 02/05/2024   ANIONGAP 7 07/19/2021   Lab Results  Component Value Date   CHOL 136 10/31/2023   HDL 48.90 10/31/2023   LDLCALC 49 10/31/2023   LDLDIRECT 86.0 12/11/2021   TRIG 190.0 (H) 10/31/2023   CHOLHDL 3 10/31/2023   IMPRESSION AND PLAN:  #1 episodic anxiety. He does well with as needed use of alprazolam  0.5 mg. He is taking this medication as prescribed. A new prescription was not needed today.  #2 Hypercholesterolemia. With his CADASIL he should have a goal LDL of less than 70. Last LDL was 49 about 6 months ago. Continue rosuvastatin  20 mg a day and check lipid panel today.  An After Visit Summary was printed and given to the patient.  FOLLOW UP: Return in about 6 months (around 10/27/2024) for annual CPE (fasting).  Signed:  Gerlene Hockey, MD           04/29/2024

## 2024-05-03 ENCOUNTER — Ambulatory Visit: Payer: Self-pay | Admitting: Family Medicine

## 2024-05-14 ENCOUNTER — Other Ambulatory Visit (HOSPITAL_BASED_OUTPATIENT_CLINIC_OR_DEPARTMENT_OTHER): Payer: Self-pay

## 2024-05-14 ENCOUNTER — Ambulatory Visit: Admitting: Family Medicine

## 2024-05-14 ENCOUNTER — Encounter: Payer: Self-pay | Admitting: Family Medicine

## 2024-05-14 VITALS — BP 125/78 | HR 75 | Temp 99.6°F | Ht 72.0 in | Wt 274.0 lb

## 2024-05-14 DIAGNOSIS — L03032 Cellulitis of left toe: Secondary | ICD-10-CM | POA: Diagnosis not present

## 2024-05-14 DIAGNOSIS — L97521 Non-pressure chronic ulcer of other part of left foot limited to breakdown of skin: Secondary | ICD-10-CM | POA: Diagnosis not present

## 2024-05-14 MED ORDER — CEPHALEXIN 500 MG PO CAPS
500.0000 mg | ORAL_CAPSULE | Freq: Three times a day (TID) | ORAL | 0 refills | Status: DC
  Filled 2024-05-14: qty 21, 7d supply, fill #0

## 2024-05-14 NOTE — Progress Notes (Signed)
 OFFICE VISIT  05/14/2024  CC:  Chief Complaint  Patient presents with   Toe Concern    Pt c/o blister/wound on left 4th toe x 5-6 days after extensive walking of 10 miles per day    Patient is a 28 y.o. male who presents for toe concern.  HPI: He just spent a week walking many miles in [place].  Just got back last night.  He has had a blister on the inside of the fourth toe of the left foot. He had a Band-Aid on it and when he took the Band-Aid off it took the surface of the blister off.  It hurts quite a bit now. He otherwise feels fine other than jet lag.  Past Medical History:  Diagnosis Date   Abnormal brain MRI 03/2021   Multiple white matter signal abnormalities around the lateral ventricles->? MS, ? CADASIL-advanced blood testing and LP planned by neurology as of 03/2021, plus referral to [facility] MS clinic.   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 Avram). 03/2019 colonoscopy->mild active colitis. Recall 1 yr.   Fundic gland polyps of stomach, benign 10/2020   GERD (gastroesophageal reflux disease)    Hx of adenomatous colonic polyps 03/18/2019   Hyperlipemia, mixed 2019,2020   Migraine syndrome    avoid triptans d/t dx of CADASIL   Obesity, Class I, BMI 30-34.9    Paresthesias    2022->L side of face, L hand and foot.  Lyme neg. See abnormal MRI in PMH section.   Seasonal allergic rhinitis     Past Surgical History:  Procedure Laterality Date   COLONOSCOPY  2012;03/16/2019; 10/2020   2012 crohns. 03/2019->2 adenomas, mild active colitis, otherwise in remission. 10/2020-normal.   ESOPHAGOGASTRODUODENOSCOPY  2012   2012.  10/26/20->mild acute and chronic esophagitis (eosin neg)   TONSILLECTOMY AND ADENOIDECTOMY  08/13/2005    Outpatient Medications Prior to Visit  Medication Sig Dispense Refill   acetaminophen (TYLENOL)  500 MG tablet Take 1,000 mg by mouth every 6 (six) hours as needed for moderate pain.     ALPRAZolam  (XANAX ) 0.5 MG tablet Take 1 tablet (0.5 mg total) by mouth 2 (two) times daily as needed for anxiety. 30 tablet 0   aspirin EC 81 MG tablet Take 81 mg by mouth daily. Swallow whole.     cetirizine (ZYRTEC) 10 MG tablet Take 10 mg by mouth daily as needed for allergies.     cyclobenzaprine (FLEXERIL) 10 MG tablet Take by mouth.     famotidine (PEPCID) 10 MG tablet Take 10 mg by mouth daily as needed for heartburn or indigestion.     mercaptopurine  (PURINETHOL ) 50 MG tablet Take 2 tablets (100 mg total) by mouth daily on an empty stomach 1 hour before or 2 hours after meals. *Caution: Chemotherapy.* 180 tablet 3   mesalamine  (LIALDA ) 1.2 g EC tablet Take 4 tablets (4.8 g total) by mouth daily with breakfast. 360 tablet 3   omeprazole  (PRILOSEC) 40 MG capsule Take 1 capsule (40 mg total) by mouth daily. 90 capsule 3   Rimegepant Sulfate  (NURTEC) 75 MG TBDP Take 1 tablet by mouth daily as needed (HA).     Rimegepant Sulfate  (NURTEC) 75 MG TBDP Dissolve 1 tablet (75 mg total) on tongue as needed for migraine to abort migraine attack. 8 tablet 6   rosuvastatin  (CRESTOR ) 20 MG  tablet Take 1 tablet (20 mg total) by mouth daily. 90 tablet 3   No facility-administered medications prior to visit.    Allergies  Allergen Reactions   Codeine Rash    Review of Systems  As per HPI  PE:    05/14/2024    1:55 PM 04/29/2024    1:04 PM 02/05/2024    8:31 AM  Vitals with BMI  Height 6' 0 6' 0 6' 0  Weight 274 lbs 279 lbs 6 oz 273 lbs  BMI 37.15 37.89 37.02  Systolic 125 117 857  Diastolic 78 77 84  Pulse 75 79 74     Physical Exam  Gen: Alert, well appearing.  Patient is oriented to person, place, time, and situation. AFFECT: pleasant, lucid thought and speech. Medial aspect of left fourth toe with 1.5 to 2 cm round superficial ulcer.  There is a little bit of surrounding erythema.  Definitely  tender to touch.  Mild soft tissue swelling focally.  LABS:  none  IMPRESSION AND PLAN:  #1 friction ulcer left fourth toe. Slight possibility of secondary infection, especially considering the marked increase in pain in the last 24 hours. Keflex 500 mg 3 times daily x 7 days. Discussed Epsom salt soaks 20 minutes twice a day, stay off the foot as much as possible.  Air the area out if possible.  An After Visit Summary was printed and given to the patient.  FOLLOW UP: Return if symptoms worsen or fail to improve.  Signed:  Gerlene Hockey, MD           05/14/2024

## 2024-06-01 ENCOUNTER — Other Ambulatory Visit: Payer: Self-pay

## 2024-06-02 ENCOUNTER — Other Ambulatory Visit (HOSPITAL_BASED_OUTPATIENT_CLINIC_OR_DEPARTMENT_OTHER): Payer: Self-pay

## 2024-06-05 ENCOUNTER — Encounter: Payer: Self-pay | Admitting: Internal Medicine

## 2024-06-05 ENCOUNTER — Other Ambulatory Visit: Payer: Self-pay | Admitting: Internal Medicine

## 2024-06-05 MED ORDER — HYDROCORT-PRAMOXINE (PERIANAL) 2.5-1 % EX CREA
1.0000 | TOPICAL_CREAM | Freq: Three times a day (TID) | CUTANEOUS | 0 refills | Status: DC | PRN
  Filled 2024-06-11: qty 30, 10d supply, fill #0

## 2024-06-11 ENCOUNTER — Other Ambulatory Visit (HOSPITAL_BASED_OUTPATIENT_CLINIC_OR_DEPARTMENT_OTHER): Payer: Self-pay

## 2024-06-12 ENCOUNTER — Other Ambulatory Visit (HOSPITAL_BASED_OUTPATIENT_CLINIC_OR_DEPARTMENT_OTHER): Payer: Self-pay

## 2024-06-15 ENCOUNTER — Other Ambulatory Visit: Payer: Self-pay | Admitting: Family Medicine

## 2024-06-15 ENCOUNTER — Other Ambulatory Visit (HOSPITAL_BASED_OUTPATIENT_CLINIC_OR_DEPARTMENT_OTHER): Payer: Self-pay

## 2024-06-15 ENCOUNTER — Other Ambulatory Visit: Payer: Self-pay

## 2024-06-15 MED ORDER — ALPRAZOLAM 0.5 MG PO TABS
0.5000 mg | ORAL_TABLET | Freq: Two times a day (BID) | ORAL | 5 refills | Status: AC | PRN
  Filled 2024-06-15: qty 30, 15d supply, fill #0
  Filled 2024-07-21: qty 30, 15d supply, fill #1
  Filled 2024-08-31: qty 30, 15d supply, fill #2

## 2024-06-19 DIAGNOSIS — I67850 Cerebral autosomal dominant arteriopathy with subcortical infarcts and leukoencephalopathy: Secondary | ICD-10-CM | POA: Diagnosis not present

## 2024-06-19 DIAGNOSIS — G43109 Migraine with aura, not intractable, without status migrainosus: Secondary | ICD-10-CM | POA: Diagnosis not present

## 2024-07-21 ENCOUNTER — Other Ambulatory Visit: Payer: Self-pay | Admitting: Internal Medicine

## 2024-07-22 ENCOUNTER — Other Ambulatory Visit: Payer: Self-pay

## 2024-07-22 ENCOUNTER — Other Ambulatory Visit (HOSPITAL_BASED_OUTPATIENT_CLINIC_OR_DEPARTMENT_OTHER): Payer: Self-pay

## 2024-07-22 MED ORDER — HYDROCORT-PRAMOXINE (PERIANAL) 2.5-1 % EX CREA
1.0000 | TOPICAL_CREAM | Freq: Three times a day (TID) | CUTANEOUS | 0 refills | Status: AC | PRN
  Filled 2024-07-22: qty 30, 30d supply, fill #0

## 2024-07-22 MED ORDER — OMEPRAZOLE 40 MG PO CPDR
40.0000 mg | DELAYED_RELEASE_CAPSULE | Freq: Every day | ORAL | 3 refills | Status: AC
  Filled 2024-07-22: qty 90, 90d supply, fill #0

## 2024-07-29 DIAGNOSIS — I471 Supraventricular tachycardia, unspecified: Secondary | ICD-10-CM | POA: Diagnosis not present

## 2024-07-29 LAB — BASIC METABOLIC PANEL WITH GFR
BUN: 10 (ref 4–21)
CO2: 27 — AB (ref 13–22)
Chloride: 104 (ref 99–108)
Creatinine: 1 (ref 0.6–1.3)
Glucose: 106
Potassium: 4 meq/L (ref 3.5–5.1)
Sodium: 141 (ref 137–147)

## 2024-07-29 LAB — COMPREHENSIVE METABOLIC PANEL WITH GFR
Albumin: 5 (ref 3.5–5.0)
Calcium: 9.6 (ref 8.7–10.7)

## 2024-07-29 LAB — HEPATIC FUNCTION PANEL
ALT: 37 U/L (ref 10–40)
AST: 31 (ref 14–40)
Alkaline Phosphatase: 76 (ref 25–125)
Bilirubin, Total: 0.3

## 2024-07-29 LAB — CBC AND DIFFERENTIAL
HCT: 44 (ref 41–53)
Hemoglobin: 14.8 (ref 13.5–17.5)
Neutrophils Absolute: 8.3
Platelets: 443 K/uL — AB (ref 150–400)
WBC: 10.5

## 2024-07-29 LAB — CBC: RBC: 5.07 (ref 3.87–5.11)

## 2024-07-29 NOTE — ED Provider Notes (Signed)
 ------------------------------------------------------------------------------- Attestation signed by Franky Butler Graff, MD at 08/01/2024 10:45 AM I have personally performed a history and physical examination of the patient and discussed the management plan with the resident physician. I have reviewed the resident documentation. -------------------------------------------------------------------------------  [facility] Emergency Department Physician Note   Medical Decision Making   HPI/ROS:    Michael Moon is a 28 y.o. male with PMH of CADASIL, crohn's and GERD presenting to the ED for palpitations. Patient reports this has happened to him before but it typically goes away on its own. This time he has felt his heart racing for the past 2 hours. He denies CP, COP, N/V, diaphoresis, and syncope.  History obtained from patient, chart review  Physical exam is notable for: -Vital signs notable for Tachycardia -HR 220s, HDS   On my initial evaluation, patient is:  hemodynamically stable in no acute distress normal respiratory effort.   Based on this patient's current presentation, including their history and physical exam, differential diagnosis includes but is not limited to: SVT, afib RVR with aberrancy, aflutter, electrolyte derangement  On arrival, EKG showed SVT with HR 210. Patient was brought directly back to a treatment room, connected to the cardiac monitor and code cart. Patient was HDS and conversational. I discussed with the patient attempting vagal maneuver's prior to further medical management. Ice was applied to patient's chest, he was given a syringe to blow through and carotid massage performed. These attempts resulted in rate improvement but did not correct the SVT. Patient was instructed on what to expect with the REVERT maneuver. This was performed and was successful with achieving NSR.    Repeat EKG with sinus tachycardia and HR 110. Labs  reassuring without leukocytosis, anemia or significant electrolyte abnormality. Mag WNL. On re-evaluation, HR has improved to 90 BPM. Patient reports full resolution in his palpitations without recurrence of symptoms. Patient is medically ready for discharge home with outpatient cardiology follow up. 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 Ambulatory referral was placed with cardiology. Strict return precautions were given including CP, palpitations, dizziness and other near syncopal symptoms. Patient agreed with this plan and was discharged home in stable condition.   Disposition:  DISCHARGE: Patient is felt to be medically appropriate for discharge at this time. Patient was informed of all pertinent physical exam, laboratory, and imaging findings. Patients suspected etiology of their symptom presentation was discussed with the patient and all questions were addressed. Patient was instructed to follow up with their primary care doctor for re-evaluation. Patient was given strict return precautions. The patient agreed with the discharge plan and verbalized understanding of return precautions. The patient was assured that we would be happy to reevaluate them at anytime if they had any concerns about their health regardless of whether it was related to this visit; and, ultimately, discharged in stable condition.   ED Disposition     ED Disposition  Discharge   Condition  Stable   Comment  --         The plan for this patient was discussed with Dr. Franky Butler Graff, MD , who voiced agreement and who oversaw evaluation and treatment of this patient.    Due to the patients current presenting symptoms, physical exam findings, and the workup stated above, it is thought that  the etiology of the patients current  presentation is:  1. SVT (supraventricular tachycardia) (CMD)      Clinical Complexity A medically appropriate history, review of systems, and physical exam was performed.  Patient's presentation is most consistent with acute illness / injury with systematic symptoms  This record was generated with the aid of voice dictation software, and may contain errors. Please contact me for any clarification or with any questions.    HPI/ROS      See MDM  Medical History[1]  Surgical History[2]    Physical Exam   Vitals:   07/29/24 2129 07/29/24 2131 07/29/24 2338 07/29/24 2359  BP:  (!) 161/71 131/71   Pulse: (!) 218  97 94  Resp: 20   20  Temp: 99.9 F (37.7 C)     TempSrc: Oral     SpO2: 100%  97% 97%  Weight: 129 kg (284 lb)     Height: 182.9 cm (6')       Physical Exam Vitals reviewed.  Constitutional:      General: He is not in acute distress.    Appearance: Normal appearance.  HENT:     Head: Normocephalic and atraumatic.     Nose: Nose normal.  Eyes:     Extraocular Movements: Extraocular movements intact.     Pupils: Pupils are equal, round, and reactive to light.  Cardiovascular:     Rate and Rhythm: Regular rhythm. Tachycardia present.     Pulses: Normal pulses.     Heart sounds: Normal heart sounds.  Pulmonary:     Effort: Pulmonary effort is normal.     Breath sounds: Normal breath sounds.  Abdominal:     General: Abdomen is flat. Bowel sounds are normal.     Palpations: Abdomen is soft.     Tenderness: There is no abdominal tenderness.  Musculoskeletal:        General: Normal range of motion.     Cervical back: Normal range of motion.  Skin:    General: Skin is warm and dry.     Capillary Refill: Capillary refill takes less than 2 seconds.  Neurological:     General: No focal deficit present.     Mental Status: He is alert and oriented to person, place, and time. Mental status is at baseline.         Please note that this documentation was  produced with the assistance of voice-to-text technology and may contain errors.        [1] Past Medical History: Diagnosis Date   Anemia    Crohn's disease of colon    (CMD)    GERD (gastroesophageal reflux disease)   [2] No past surgical history on file.

## 2024-07-30 ENCOUNTER — Encounter: Payer: Self-pay | Admitting: Family Medicine

## 2024-07-30 DIAGNOSIS — I471 Supraventricular tachycardia, unspecified: Secondary | ICD-10-CM | POA: Diagnosis not present

## 2024-07-30 NOTE — Telephone Encounter (Signed)
 No further action needed at this time.

## 2024-08-04 ENCOUNTER — Encounter: Payer: Self-pay | Admitting: Family Medicine

## 2024-08-04 ENCOUNTER — Inpatient Hospital Stay: Admitting: Family Medicine

## 2024-08-04 VITALS — BP 132/80 | HR 83 | Temp 97.3°F | Ht 72.0 in | Wt 283.6 lb

## 2024-08-04 DIAGNOSIS — I471 Supraventricular tachycardia, unspecified: Secondary | ICD-10-CM | POA: Diagnosis not present

## 2024-08-04 NOTE — Progress Notes (Signed)
 OFFICE VISIT  08/04/2024  CC:  Chief Complaint  Patient presents with   Follow-up    ED    Patient is a 28 y.o. male who presents for emergency department follow-up. I reviewed the ED encounter from 07/29/2024 ([facility] emergency department).  He presented with palpitations for 2 hours. Heart rate to 120s in the ED.  He was hemodynamically stable.  Two Vagal maneuvers were attempted without resolution of the SVT.  The REVERT maneuver was performed and was successful with achieving NSR. CBC and complete metabolic panel and magnesium all normal. 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  Close follow-up with me and cardiology evaluation was recommended. No medications were prescribed.  INTERIM HX: Michael Moon has been feeling well. No episodes of palpitations/tachycardia since the emergency department.  He says that he is actually had repeated periods of brief palpitations (approximately 5 minutes) all of his adult life. Never have they have lasted as long as the recent 1, though.  Past Medical History:  Diagnosis Date   Abnormal brain MRI 03/2021   Multiple white matter signal abnormalities around the lateral ventricles->? MS, ? CADASIL-advanced blood testing and LP planned by neurology as of 03/2021, plus referral to [facility] MS clinic.   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 Avram). 03/2019 colonoscopy->mild active colitis. Recall 1 yr.   Fundic gland polyps of stomach, benign 10/2020   GERD (gastroesophageal reflux disease)    Hx of adenomatous colonic polyps 03/18/2019   Hyperlipemia, mixed 2019,2020   Migraine syndrome    avoid triptans d/t dx of CADASIL   Obesity, Class I, BMI 30-34.9     Paresthesias    2022->L side of face, L hand and foot.  Lyme neg. See abnormal MRI in PMH section.   Seasonal allergic rhinitis     Past Surgical History:  Procedure Laterality Date   COLONOSCOPY  2012;03/16/2019; 10/2020   2012 crohns. 03/2019->2 adenomas, mild active colitis, otherwise in remission. 10/2020-normal.   ESOPHAGOGASTRODUODENOSCOPY  2012   2012.  10/26/20->mild acute and chronic esophagitis (eosin neg)   TONSILLECTOMY AND ADENOIDECTOMY  08/13/2005    Outpatient Medications Prior to Visit  Medication Sig Dispense Refill   acetaminophen (TYLENOL) 500 MG tablet Take 1,000 mg by mouth every 6 (six) hours as needed for moderate pain.     ALPRAZolam  (XANAX ) 0.5 MG tablet Take 1 tablet (0.5 mg total) by mouth 2 (two) times daily as needed for anxiety. 30 tablet 5   aspirin EC 81 MG tablet Take 81 mg by mouth daily. Swallow whole.     cetirizine (ZYRTEC) 10 MG tablet Take 10 mg by mouth daily as needed for allergies.     cyclobenzaprine (FLEXERIL) 10 MG tablet Take by mouth.     famotidine (PEPCID) 10 MG tablet Take 10 mg by mouth daily as needed for heartburn or indigestion.     hydrocortisone -pramoxine (ANALPRAM  HC) 2.5-1 % rectal cream Place 1 Application rectally 3 (three) times daily as needed for hemorrhoids or anal itching. 30 g 0   mercaptopurine  (PURINETHOL ) 50 MG tablet Take 2 tablets (100 mg total) by mouth daily on an empty stomach 1 hour before or 2 hours after  meals. *Caution: Chemotherapy.* 180 tablet 3   mesalamine  (LIALDA ) 1.2 g EC tablet Take 4 tablets (4.8 g total) by mouth daily with breakfast. 360 tablet 3   omeprazole  (PRILOSEC) 40 MG capsule Take 1 capsule (40 mg total) by mouth daily. 90 capsule 3   Rimegepant Sulfate  (NURTEC) 75 MG TBDP Take 1 tablet by mouth daily as needed (HA).     Rimegepant Sulfate  (NURTEC) 75 MG TBDP Dissolve 1 tablet (75 mg total) on tongue as needed for migraine to abort migraine attack. 8 tablet 6   rosuvastatin  (CRESTOR ) 20 MG tablet  Take 1 tablet (20 mg total) by mouth daily. 90 tablet 3   cephALEXin  (KEFLEX ) 500 MG capsule Take 1 capsule (500 mg total) by mouth 3 (three) times daily. (Patient not taking: Reported on 08/04/2024) 21 capsule 0   No facility-administered medications prior to visit.    Allergies[1]  Review of Systems As per HPI  PE:    08/04/2024    9:30 AM 08/04/2024    9:28 AM 05/14/2024    1:55 PM  Vitals with BMI  Height  6' 0 6' 0  Weight  283 lbs 10 oz 274 lbs  BMI  38.45 37.15  Systolic 132 149 874  Diastolic 80 76 78  Pulse  83 75     Physical Exam  Gen: Alert, well appearing.  Patient is oriented to person, place, time, and situation. AFFECT: pleasant, lucid thought and speech. CV: RRR, no m/r/g.   LUNGS: CTA bilat, nonlabored resps, good aeration in all lung fields. EXT: no clubbing or cyanosis.  no edema.    LABS:  Last CBC Lab Results  Component Value Date   WBC 10.5 07/29/2024   HGB 14.8 07/29/2024   HCT 44 07/29/2024   MCV 87.8 02/05/2024   MCH 29.0 12/03/2022   RDW 15.0 02/05/2024   PLT 443 (A) 07/29/2024   Last metabolic panel Lab Results  Component Value Date   GLUCOSE 85 10/31/2023   NA 141 07/29/2024   K 4.0 07/29/2024   CL 104 07/29/2024   CO2 27 (A) 07/29/2024   BUN 10 07/29/2024   CREATININE 1.0 07/29/2024   GFR 120.14 10/31/2023   CALCIUM  9.6 07/29/2024   PROT 7.9 02/05/2024   ALBUMIN 5.0 07/29/2024   LABGLOB 2.8 12/03/2022   AGRATIO 1.8 12/03/2022   BILITOT 0.5 02/05/2024   ALKPHOS 76 07/29/2024   AST 31 07/29/2024   ALT 37 07/29/2024   ANIONGAP 7 07/19/2021   Last thyroid  functions Lab Results  Component Value Date   TSH 0.92 10/31/2023   IMPRESSION AND PLAN:  Supraventricular tachycardia. Successful termination with vagal maneuvers in the emergency department 6 days ago. Asymptomatic since then. He prefers a cardiac specialist within the [facility] network. I referred him today to Dr. Fonda Kitty. He had the appropriate labs in  the emergency department on 07/29/2024 and all were normal. I we will order an echocardiogram today. We discussed the possibility of starting beta-blocker or calcium  channel blocker today but through the shared decision-making process decided against it.  An After Visit Summary was printed and given to the patient.  FOLLOW UP: Return for as needed.  Signed:  Gerlene Hockey, MD           08/04/2024     [1]  Allergies Allergen Reactions   Codeine Rash

## 2024-08-14 ENCOUNTER — Encounter: Payer: Self-pay | Admitting: Family Medicine

## 2024-08-14 ENCOUNTER — Ambulatory Visit (HOSPITAL_COMMUNITY)
Admission: RE | Admit: 2024-08-14 | Discharge: 2024-08-14 | Disposition: A | Discharge status: Home / Self Care | Source: Ambulatory Visit | Attending: Family Medicine | Admitting: Family Medicine

## 2024-08-14 DIAGNOSIS — I371 Nonrheumatic pulmonary valve insufficiency: Secondary | ICD-10-CM | POA: Insufficient documentation

## 2024-08-14 DIAGNOSIS — I517 Cardiomegaly: Secondary | ICD-10-CM | POA: Diagnosis not present

## 2024-08-14 DIAGNOSIS — I471 Supraventricular tachycardia, unspecified: Secondary | ICD-10-CM | POA: Insufficient documentation

## 2024-08-14 LAB — ECHOCARDIOGRAM COMPLETE
Area-P 1/2: 3.56 cm2
S' Lateral: 3.4 cm

## 2024-08-16 ENCOUNTER — Encounter: Payer: Self-pay | Admitting: Family Medicine

## 2024-08-16 ENCOUNTER — Ambulatory Visit: Payer: Self-pay | Admitting: Family Medicine

## 2024-08-17 NOTE — Telephone Encounter (Signed)
 I sent Michael Moon a message last night attached to his echo result.

## 2024-08-31 ENCOUNTER — Other Ambulatory Visit (HOSPITAL_BASED_OUTPATIENT_CLINIC_OR_DEPARTMENT_OTHER): Payer: Self-pay

## 2024-08-31 NOTE — Progress Notes (Unsigned)
 " Electrophysiology Office Note:   Date:  09/01/2024  ID:  [name], DOB 08-18-1995, MRN [id number]  Primary Cardiologist: None Electrophysiologist: None      History of Present Illness:   Michael Moon is a 29 y.o. male with h/o Crohn's disease, CADASIL, SVT who is being seen today for SVT management.  Discussed the use of AI scribe software for clinical note transcription with the patient, who gave verbal consent to proceed.  History of Present Illness Michael Moon is a 29 year old male with supraventricular tachycardia who presents for evaluation of persistent episodes of tachycardia.  He has a history of supraventricular tachycardia (SVT) with episodes beginning around puberty. These episodes are characterized by random occurrences of tachycardia lasting from a few minutes to two hours, sometimes triggered by minimal activity such as bending down. He has attempted maneuvers like the Valsalva maneuver and splashing cold water on his face with limited success. He uses a personal ECG device to monitor his heart rate during episodes, recording rates as high as 207 beats per minute. These episodes are unpredictable and disruptive to his daily life, including work.  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 He has previously visited the emergency department for these episodes, where he was monitored and discharged with instructions to follow up with cardiology.  His past medical history includes Crohn's disease, diagnosed at age 60, managed with mesalazine and mercaptopurine . 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 He takes a daily 81 mg aspirin for CADASIL management.  A recent echocardiogram showed an ejection fraction of 48% and reported mild to moderate enlargement of the right ventricle. He has not been  started on any new medications following his last emergency department visit.   Review of systems complete and found to be negative unless listed in HPI.   EP Information / Studies Reviewed:    EKG is ordered today. Personal review as below.  EKG Interpretation Date/Time:  Tuesday September 01 2024 15:54:53 EST Ventricular Rate:  104 PR Interval:  164 QRS Duration:  92 QT Interval:  326 QTC Calculation: 428 R Axis:   92  Text Interpretation: Sinus tachycardia Rightward axis No previous ECGs available Confirmed by Kennyth Chew [phone]) on 09/01/2024 9:06:09 PM   ECG 07/29/24: Ventricular Rate                   210       BPM                  QRS Duration                       84        ms                   Q-T Interval                       224       ms                   QTC Calculation Bazett             418       ms  Calculated R Axis                  60        degrees              Calculated T Axis                  -87       degrees               Critical Test Result  High HR  Supraventricular tachycardia  Possible Atrial tachycardia Vs Atrial flutter with 1 1 conduction  ST depression and T wave inversion suggests ischemia   No previous ECGs available   Echo 08/14/24:   1. Left ventricular ejection fraction, by estimation, is 45 to 50%. The  left ventricle has mildly decreased function. The left ventricle has no  regional wall motion abnormalities. Left ventricular diastolic parameters  were normal. The average left  ventricular global longitudinal strain is -15.1 %. The global longitudinal  strain is abnormal.   2. Right ventricular systolic function is normal. The right ventricular  size is RV visually mild to moderately enlarged.   3. The mitral valve is normal in structure. Trivial mitral valve  regurgitation. No evidence of mitral stenosis.   4. The aortic valve is normal in structure. Aortic valve regurgitation is  not visualized. No aortic stenosis  is present.   5. The inferior vena cava is normal in size with <50% respiratory  variability, suggesting right atrial pressure of 8 mmHg.    Physical Exam:   VS:  BP 134/84   Pulse (!) 104   Ht 6' (1.829 m)   Wt 287 lb 12.8 oz (130.5 kg)   SpO2 98%   BMI 39.03 kg/m    Wt Readings from Last 3 Encounters:  09/01/24 287 lb 12.8 oz (130.5 kg)  08/04/24 283 lb 9.6 oz (128.6 kg)  05/14/24 274 lb (124.3 kg)     General: Well developed, in no acute distress.  Neck: No JVD.  Cardiac: Normal rate, regular rhythm.  Resp: Normal work of breathing.  Ext: No edema.  Neuro: No gross focal deficits.  Psych: Normal affect.   ASSESSMENT AND PLAN:    #SVT: Symptomatic and sustained.  #Palpitations -Patient has had episodic palpitations since puberty, likely representative of his SVT episodes. 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 He most recently had a 2 hour episode which required presentation to ED. He is very symptomatic and these are disruptive to his quality of life. We discussed treatment options to prevent future episodes including suppressive therapy with flecainide (which would require cardiac MRI prior to starting due to his echo findings) or EP study and ablation. Risks and benefits of each strategy, as well as likelihood of success, were discussed. Patient voiced understanding and elected to proceed with EP study and ablation.  - Start diltiazem  120mg  daily.    Follow up with EP Team 3 months after ablation.  Signed, Fonda Kitty, MD  "

## 2024-09-01 ENCOUNTER — Encounter: Payer: Self-pay | Admitting: Cardiology

## 2024-09-01 ENCOUNTER — Ambulatory Visit: Attending: Cardiology | Admitting: Cardiology

## 2024-09-01 ENCOUNTER — Other Ambulatory Visit (HOSPITAL_BASED_OUTPATIENT_CLINIC_OR_DEPARTMENT_OTHER): Payer: Self-pay

## 2024-09-01 ENCOUNTER — Other Ambulatory Visit: Payer: Self-pay

## 2024-09-01 VITALS — BP 134/84 | HR 104 | Ht 72.0 in | Wt 287.8 lb

## 2024-09-01 DIAGNOSIS — I471 Supraventricular tachycardia, unspecified: Secondary | ICD-10-CM

## 2024-09-01 DIAGNOSIS — R002 Palpitations: Secondary | ICD-10-CM

## 2024-09-01 MED ORDER — DILTIAZEM HCL ER COATED BEADS 120 MG PO CP24
120.0000 mg | ORAL_CAPSULE | Freq: Every day | ORAL | 3 refills | Status: AC
  Filled 2024-09-01: qty 90, 90d supply, fill #0

## 2024-09-01 NOTE — Patient Instructions (Signed)
 Medication Instructions:  Your physician has recommended you make the following change in your medication:   1) START taking diltiazem  120 mg once daily  *If you need a refill on your cardiac medications before your next appointment, please call your pharmacy*  Testing/Procedures: Ablation Your physician has recommended that you have an ablation. Catheter ablation is a medical procedure used to treat some cardiac arrhythmias (irregular heartbeats). During catheter ablation, a long, thin, flexible tube is put into a blood vessel in your groin (upper thigh), or neck. This tube is called an ablation catheter. It is then guided to your heart through the blood vessel. Radio frequency waves destroy small areas of heart tissue where abnormal heartbeats may cause an arrhythmia to start.   You are scheduled for SVT Ablation on Monday, March 2 with Dr. Dr. Kennyth. Please arrive at the Main Entrance A at [facility]: [address], [place] [address] at 9:30 AM   What To Expect:  Labs: you will need to have lab work drawn within 30 days of your procedure. Please go to any [facility] location to have these drawn - no appointment is needed. You will receive procedure instructions either through MyChart or in the mail 4-6 week prior to your procedure.  After your procedure we recommend no driving for 4 days, no lifting over 5 lbs for 7 days, and no work or strenuous activity for 7 days.  Please contact our office at [phone] if you have any questions.    Follow-Up: We will contact you to schedule your post-procedure appointments.

## 2024-09-02 ENCOUNTER — Other Ambulatory Visit (HOSPITAL_BASED_OUTPATIENT_CLINIC_OR_DEPARTMENT_OTHER): Payer: Self-pay

## 2024-09-11 ENCOUNTER — Telehealth: Payer: Self-pay

## 2024-09-11 NOTE — Telephone Encounter (Signed)
-----   Message from Nurse Doreatha BROCKS, RN sent at 09/02/2024 12:02 PM EST ----- Regarding: 3/2 SVT ablation  Precert:  MD: Kennyth Type of ablation: SVT Diagnosis: SVT CPT code: SVT/WPW (06346) Ablation scheduled (date/time): 3/2 at 11:30am  Procedure:  Added to calendar? Yes Orders entered? Yes Letter complete? No, >30 days before procedure Scheduled with cath lab? Yes Any medications to hold? Yes (please list hold instructions): diltiazem  for 2 days Labs ordered (CBC, BMET, PT/INR if on warfarin): Yes Mapping system: Doesn't matter CARTO/OPAL rep notified? No Cardiac CT needed? No Dye allergy? No Pre-meds ordered and instructions given? No, not needed Letter method: MyChart H&P: 1/20 Device: No  Follow-up:  Cassie/Angel, please schedule Routine.  Covering RN - please send this message to [employer], EP scheduler, EP Scheduling pool, EP [employer], and CT scheduler (Brittany Lynch/Stephanie Mogg), if indicated.

## 2024-09-14 ENCOUNTER — Ambulatory Visit: Admitting: Dermatology

## 2024-09-29 ENCOUNTER — Ambulatory Visit: Admitting: Dermatology

## 2024-09-30 ENCOUNTER — Ambulatory Visit: Admitting: Cardiology

## 2024-10-12 ENCOUNTER — Ambulatory Visit (HOSPITAL_COMMUNITY): Admit: 2024-10-12 | Admitting: Cardiology

## 2024-10-12 ENCOUNTER — Encounter (HOSPITAL_COMMUNITY): Payer: Self-pay
